# Patient Record
Sex: Male | Born: 1970 | Race: White | Hispanic: No | Marital: Married | Smoking: Former smoker
Health system: Southern US, Community
[De-identification: ages and names within clinical notes are randomized; demographics above are authoritative.]

## PROBLEM LIST (undated history)

## (undated) DIAGNOSIS — E785 Hyperlipidemia, unspecified: Secondary | ICD-10-CM

## (undated) DIAGNOSIS — E781 Pure hyperglyceridemia: Secondary | ICD-10-CM

## (undated) DIAGNOSIS — K439 Ventral hernia without obstruction or gangrene: Secondary | ICD-10-CM

## (undated) DIAGNOSIS — K429 Umbilical hernia without obstruction or gangrene: Secondary | ICD-10-CM

## (undated) DIAGNOSIS — I2510 Atherosclerotic heart disease of native coronary artery without angina pectoris: Secondary | ICD-10-CM

## (undated) DIAGNOSIS — I272 Pulmonary hypertension, unspecified: Secondary | ICD-10-CM

## (undated) DIAGNOSIS — Z955 Presence of coronary angioplasty implant and graft: Secondary | ICD-10-CM

## (undated) DIAGNOSIS — I255 Ischemic cardiomyopathy: Secondary | ICD-10-CM

## (undated) DIAGNOSIS — F1010 Alcohol abuse, uncomplicated: Secondary | ICD-10-CM

## (undated) DIAGNOSIS — Z720 Tobacco use: Secondary | ICD-10-CM

## (undated) DIAGNOSIS — I10 Essential (primary) hypertension: Secondary | ICD-10-CM

## (undated) DIAGNOSIS — I5189 Other ill-defined heart diseases: Secondary | ICD-10-CM

## (undated) HISTORY — DX: Pure hyperglyceridemia: E78.1

## (undated) HISTORY — DX: Hyperlipidemia, unspecified: E78.5

## (undated) HISTORY — DX: Essential (primary) hypertension: I10

## (undated) HISTORY — PX: NO PAST SURGERIES: SHX2092

## (undated) HISTORY — DX: Presence of coronary angioplasty implant and graft: Z95.5

## (undated) HISTORY — DX: Atherosclerotic heart disease of native coronary artery without angina pectoris: I25.10

## (undated) HISTORY — DX: Other ill-defined heart diseases: I51.89

---

## 2007-04-30 ENCOUNTER — Emergency Department: Payer: Self-pay | Admitting: Emergency Medicine

## 2007-05-01 IMAGING — CT CT HEAD WITHOUT CONTRAST
2 series · 16 of 30 positions shown, 20 images · non-contrast
Comparison: none

REASON FOR EXAM: headache
COMMENTS:

[Series 2: without · axial · non-contrast · 0.45mm/px · z∈[-612,-492]mm · 13 of 30 slices shown, 17 images]
[im 3/30  brain]
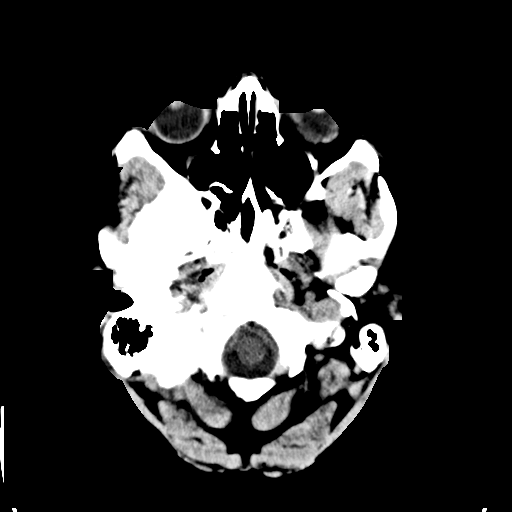
[im 3/30  bone]
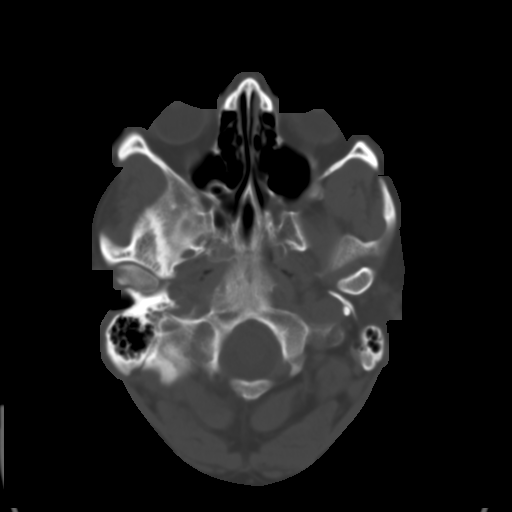
[im 5/30  brain]
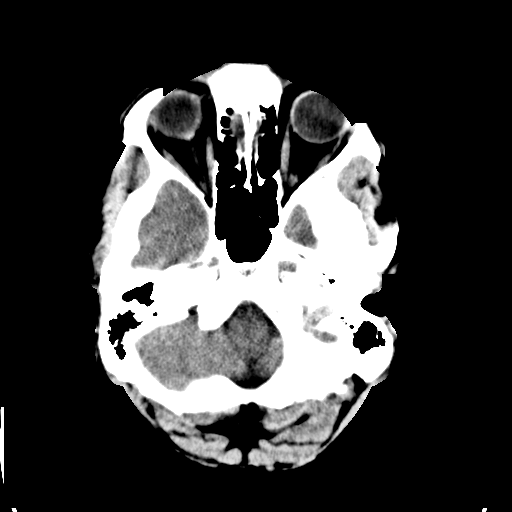
[im 7/30  brain]
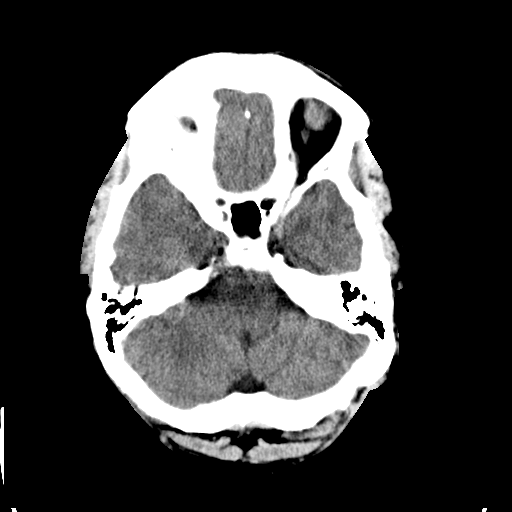
[im 9/30  brain]
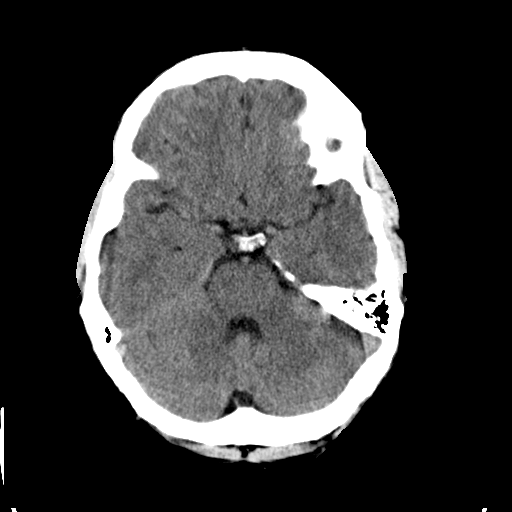
[im 11/30  brain]
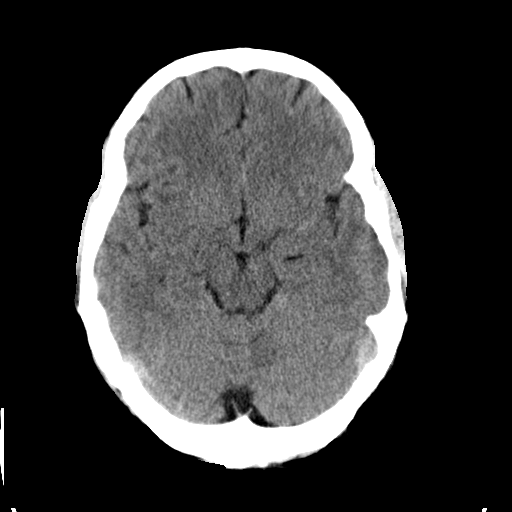
[im 11/30  bone]
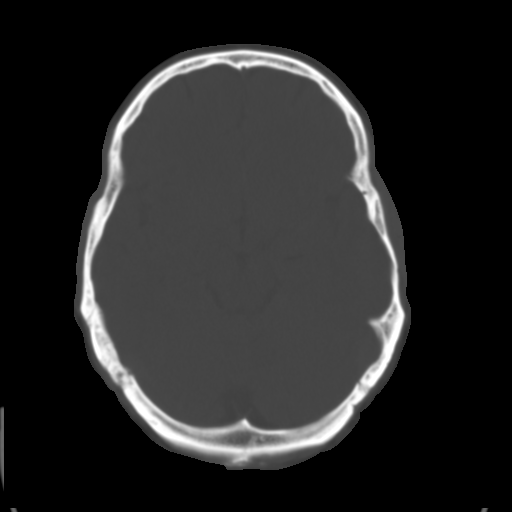
[im 13/30  brain]
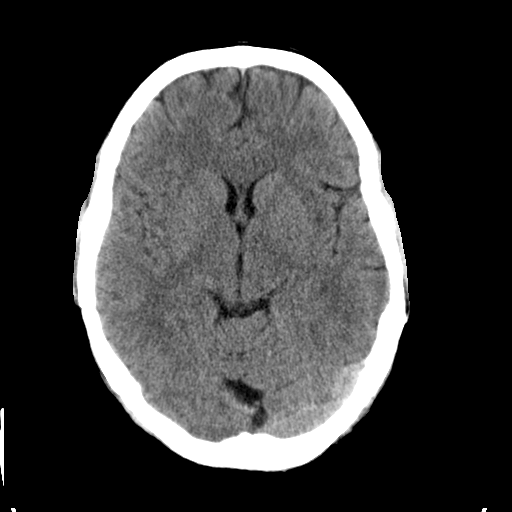
[im 15/30  brain]
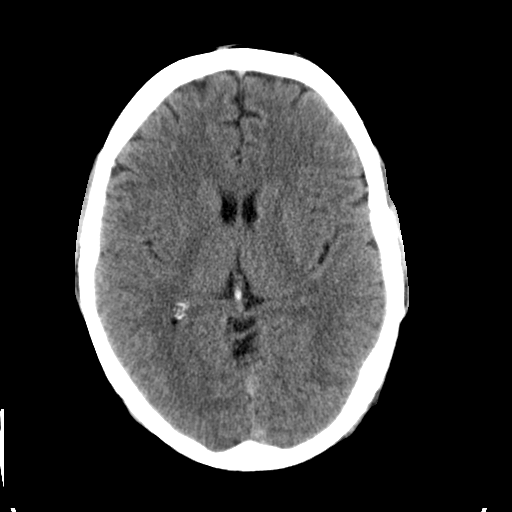
[im 17/30  brain]
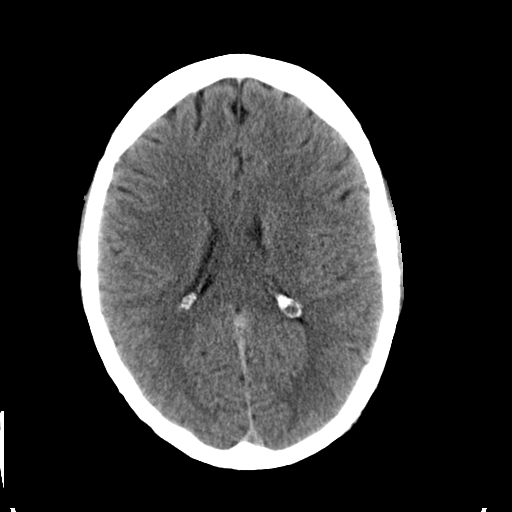
[im 19/30  brain]
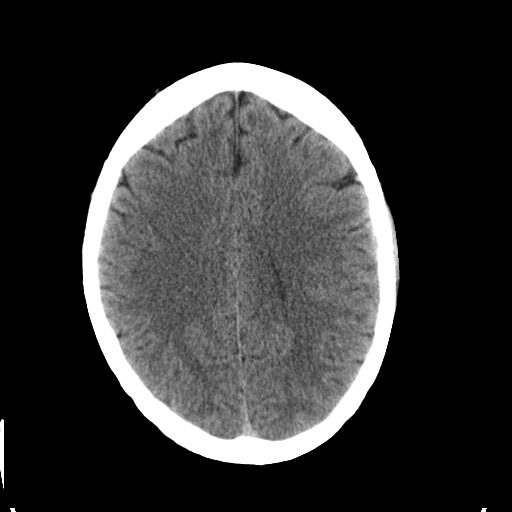
[im 19/30  bone]
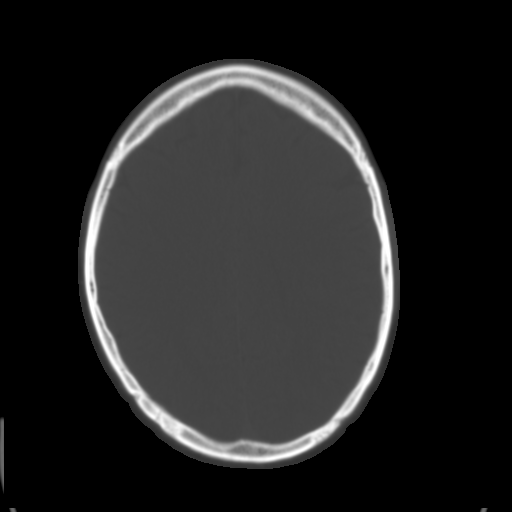
[im 21/30  brain]
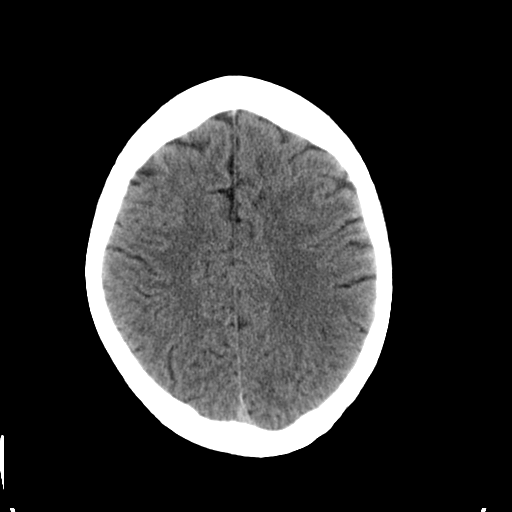
[im 23/30  brain]
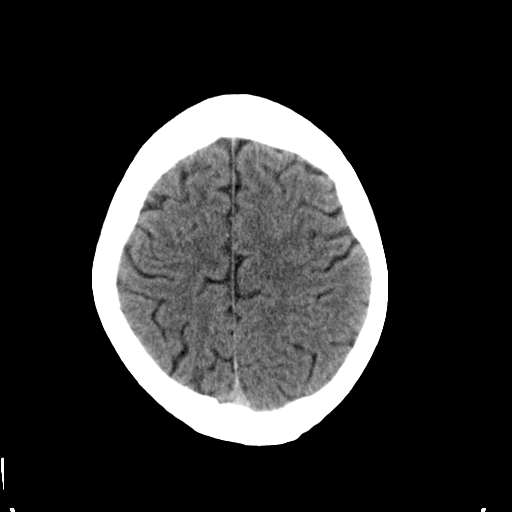
[im 25/30  brain]
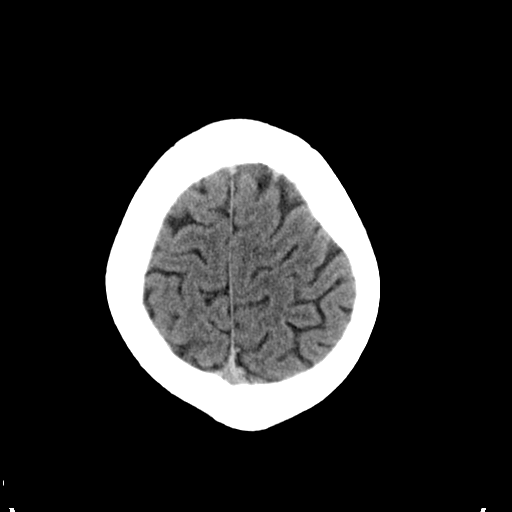
[im 27/30  brain]
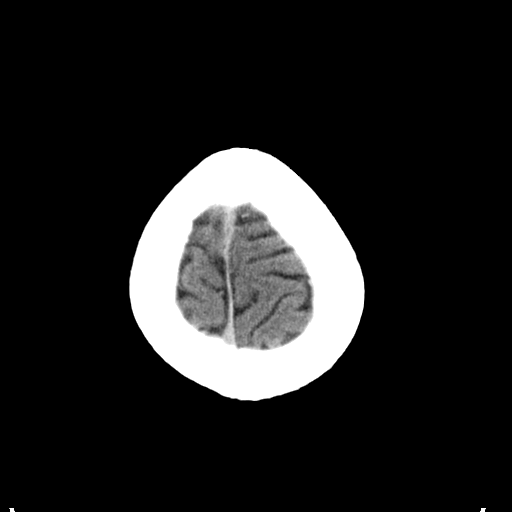
[im 27/30  bone]
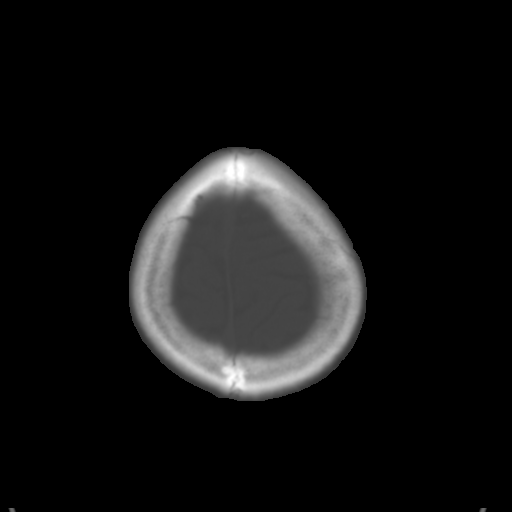

[Series 3: bone · axial · 0.45mm/px · z∈[-612,-572]mm · 3 of 30 slices shown]
[im 3/30  bone]
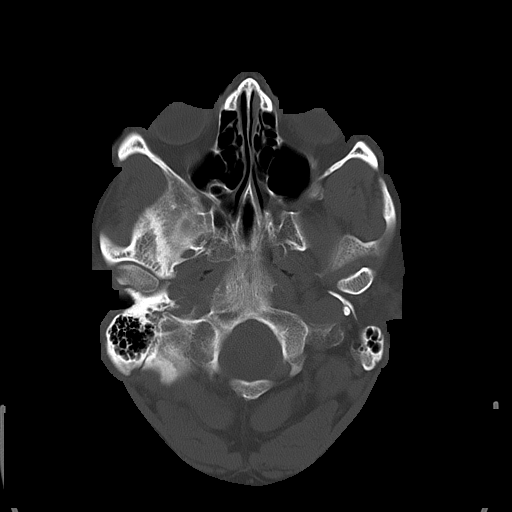
[im 7/30  bone]
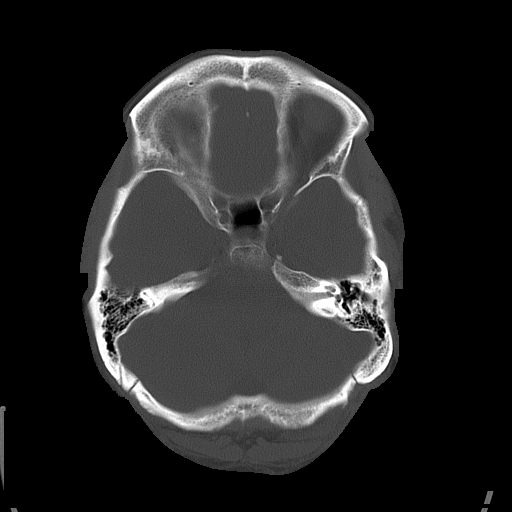
[im 11/30  bone]
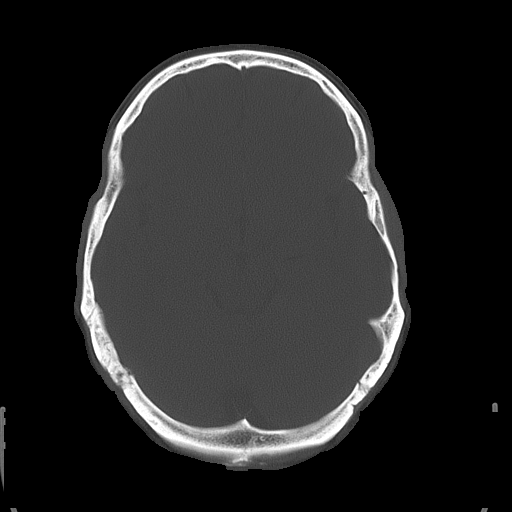

[16 of 30 positions shown; findings below may reference images not displayed]

PROCEDURE:     CT  - CT HEAD WITHOUT CONTRAST  - [DATE] [DATE]

RESULT:      There appears to be a moderate size area of fluid attenuation
in the region of the cisterna magna. This may represent an arachnoid cyst
versus a mega cisterna magna. Otherwise, no further intraaxial or extraaxial
fluid collections are identified. No evidence of acute hemorrhage. No
secondary signs are appreciated to suggest mass effect, subacute or chronic
infarction.  The visualized bony skeleton demonstrates no evidence of
fracture or dislocation.
IMPRESSION: 1.     Arachnoid cyst versus a mega cisterna magna. Otherwise, no evidence
of acute intracranial abnormalities.
2.     Dr. GENTIL of the Emergency Department was informed of these findings
via preliminary fax report on [DATE] at [DATE] a.m. CT.

## 2012-02-18 ENCOUNTER — Emergency Department: Payer: Self-pay | Admitting: Emergency Medicine

## 2012-02-18 LAB — DRUG SCREEN, URINE
Barbiturates, Ur Screen: NEGATIVE (ref ?–200)
Benzodiazepine, Ur Scrn: NEGATIVE (ref ?–200)
Cocaine Metabolite,Ur ~~LOC~~: NEGATIVE (ref ?–300)
Methadone, Ur Screen: NEGATIVE (ref ?–300)
Opiate, Ur Screen: NEGATIVE (ref ?–300)
Phencyclidine (PCP) Ur S: NEGATIVE (ref ?–25)
Tricyclic, Ur Screen: NEGATIVE (ref ?–1000)

## 2012-02-18 LAB — COMPREHENSIVE METABOLIC PANEL
Anion Gap: 11 (ref 7–16)
Calcium, Total: 8.8 mg/dL (ref 8.5–10.1)
Chloride: 110 mmol/L — ABNORMAL HIGH (ref 98–107)
Co2: 23 mmol/L (ref 21–32)
Creatinine: 0.97 mg/dL (ref 0.60–1.30)
EGFR (African American): >60
EGFR (Non-African Amer.): >60
Glucose: 108 mg/dL — ABNORMAL HIGH (ref 65–99)
Osmolality: 286 (ref 275–301)
Potassium: 3.8 mmol/L (ref 3.5–5.1)
SGOT(AST): 33 U/L (ref 15–37)
Sodium: 144 mmol/L (ref 136–145)

## 2012-02-18 LAB — CBC
HGB: 15.5 g/dL (ref 13.0–18.0)
MCH: 32.9 pg (ref 26.0–34.0)
MCHC: 35.8 g/dL (ref 32.0–36.0)
MCV: 92 fL (ref 80–100)
RDW: 12.9 % (ref 11.5–14.5)
WBC: 10.3 10*3/uL (ref 3.8–10.6)
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
## 2017-10-05 DIAGNOSIS — H524 Presbyopia: Secondary | ICD-10-CM | POA: Diagnosis not present

## 2018-01-08 ENCOUNTER — Inpatient Hospital Stay
Admission: EM | Admit: 2018-01-08 | Discharge: 2018-01-09 | DRG: 280 | Disposition: A | Discharge status: Other Acute Inpatient Hospital | Payer: BLUE CROSS/BLUE SHIELD | Attending: Internal Medicine | Admitting: Internal Medicine

## 2018-01-08 ENCOUNTER — Emergency Department: Payer: BLUE CROSS/BLUE SHIELD

## 2018-01-08 ENCOUNTER — Encounter: Payer: Self-pay | Admitting: Emergency Medicine

## 2018-01-08 ENCOUNTER — Other Ambulatory Visit: Payer: Self-pay

## 2018-01-08 DIAGNOSIS — I5031 Acute diastolic (congestive) heart failure: Secondary | ICD-10-CM | POA: Diagnosis present

## 2018-01-08 DIAGNOSIS — I214 Non-ST elevation (NSTEMI) myocardial infarction: Secondary | ICD-10-CM | POA: Diagnosis not present

## 2018-01-08 DIAGNOSIS — I160 Hypertensive urgency: Secondary | ICD-10-CM | POA: Diagnosis present

## 2018-01-08 DIAGNOSIS — H9319 Tinnitus, unspecified ear: Secondary | ICD-10-CM | POA: Diagnosis present

## 2018-01-08 DIAGNOSIS — I5030 Unspecified diastolic (congestive) heart failure: Secondary | ICD-10-CM | POA: Diagnosis not present

## 2018-01-08 DIAGNOSIS — Z8249 Family history of ischemic heart disease and other diseases of the circulatory system: Secondary | ICD-10-CM

## 2018-01-08 DIAGNOSIS — Z716 Tobacco abuse counseling: Secondary | ICD-10-CM | POA: Diagnosis not present

## 2018-01-08 DIAGNOSIS — R7301 Impaired fasting glucose: Secondary | ICD-10-CM | POA: Diagnosis not present

## 2018-01-08 DIAGNOSIS — J45909 Unspecified asthma, uncomplicated: Secondary | ICD-10-CM | POA: Diagnosis present

## 2018-01-08 DIAGNOSIS — R748 Abnormal levels of other serum enzymes: Secondary | ICD-10-CM | POA: Diagnosis not present

## 2018-01-08 DIAGNOSIS — T463X5A Adverse effect of coronary vasodilators, initial encounter: Secondary | ICD-10-CM | POA: Diagnosis not present

## 2018-01-08 DIAGNOSIS — F17210 Nicotine dependence, cigarettes, uncomplicated: Secondary | ICD-10-CM | POA: Diagnosis present

## 2018-01-08 DIAGNOSIS — I10 Essential (primary) hypertension: Secondary | ICD-10-CM | POA: Diagnosis not present

## 2018-01-08 DIAGNOSIS — F1010 Alcohol abuse, uncomplicated: Secondary | ICD-10-CM | POA: Diagnosis present

## 2018-01-08 DIAGNOSIS — G4440 Drug-induced headache, not elsewhere classified, not intractable: Secondary | ICD-10-CM | POA: Diagnosis not present

## 2018-01-08 DIAGNOSIS — I110 Hypertensive heart disease with heart failure: Secondary | ICD-10-CM | POA: Diagnosis present

## 2018-01-08 DIAGNOSIS — E785 Hyperlipidemia, unspecified: Secondary | ICD-10-CM | POA: Diagnosis not present

## 2018-01-08 DIAGNOSIS — I2510 Atherosclerotic heart disease of native coronary artery without angina pectoris: Secondary | ICD-10-CM | POA: Diagnosis present

## 2018-01-08 DIAGNOSIS — Z7141 Alcohol abuse counseling and surveillance of alcoholic: Secondary | ICD-10-CM | POA: Diagnosis not present

## 2018-01-08 DIAGNOSIS — Z801 Family history of malignant neoplasm of trachea, bronchus and lung: Secondary | ICD-10-CM | POA: Diagnosis not present

## 2018-01-08 DIAGNOSIS — R079 Chest pain, unspecified: Secondary | ICD-10-CM | POA: Diagnosis not present

## 2018-01-08 HISTORY — DX: Tobacco use: Z72.0

## 2018-01-08 HISTORY — DX: Alcohol abuse, uncomplicated: F10.10

## 2018-01-08 LAB — URINALYSIS, COMPLETE (UACMP) WITH MICROSCOPIC
BACTERIA UA: NONE SEEN
Bilirubin Urine: NEGATIVE
Glucose, UA: NEGATIVE mg/dL
Hgb urine dipstick: NEGATIVE
KETONES UR: 5 mg/dL — AB
LEUKOCYTES UA: NEGATIVE
Nitrite: NEGATIVE
PROTEIN: NEGATIVE mg/dL
SQUAMOUS EPITHELIAL / LPF: NONE SEEN (ref 0–5)
Specific Gravity, Urine: 1.021 (ref 1.005–1.030)
pH: 7 (ref 5.0–8.0)

## 2018-01-08 LAB — HEMOGLOBIN A1C
HEMOGLOBIN A1C: 5.7 % — AB (ref 4.8–5.6)
MEAN PLASMA GLUCOSE: 116.89 mg/dL

## 2018-01-08 LAB — BASIC METABOLIC PANEL
ANION GAP: 11 (ref 5–15)
BUN: 14 mg/dL (ref 6–20)
CO2: 23 mmol/L (ref 22–32)
Calcium: 8.5 mg/dL — ABNORMAL LOW (ref 8.9–10.3)
Chloride: 107 mmol/L (ref 98–111)
Creatinine, Ser: 0.93 mg/dL (ref 0.61–1.24)
Glucose, Bld: 155 mg/dL — ABNORMAL HIGH (ref 70–99)
Potassium: 3.5 mmol/L (ref 3.5–5.1)
SODIUM: 141 mmol/L (ref 135–145)

## 2018-01-08 LAB — URINE DRUG SCREEN, QUALITATIVE (ARMC ONLY)
Amphetamines, Ur Screen: NOT DETECTED
Benzodiazepine, Ur Scrn: NOT DETECTED
COCAINE METABOLITE, UR ~~LOC~~: NOT DETECTED
Cannabinoid 50 Ng, Ur ~~LOC~~: NOT DETECTED
MDMA (ECSTASY) UR SCREEN: NOT DETECTED
METHADONE SCREEN, URINE: NOT DETECTED
OPIATE, UR SCREEN: POSITIVE — AB
Phencyclidine (PCP) Ur S: NOT DETECTED
Tricyclic, Ur Screen: NOT DETECTED

## 2018-01-08 LAB — CBC
HCT: 44.9 % (ref 40.0–52.0)
HEMOGLOBIN: 16 g/dL (ref 13.0–18.0)
MCH: 33.1 pg (ref 26.0–34.0)
MCHC: 35.6 g/dL (ref 32.0–36.0)
MCV: 93.2 fL (ref 80.0–100.0)
PLATELETS: 239 10*3/uL (ref 150–440)
RBC: 4.82 MIL/uL (ref 4.40–5.90)
RDW: 13.4 % (ref 11.5–14.5)
WBC: 5.9 10*3/uL (ref 3.8–10.6)

## 2018-01-08 LAB — APTT: APTT: 27 s (ref 24–36)

## 2018-01-08 LAB — TROPONIN I
TROPONIN I: 0.81 ng/mL — AB (ref <0.03)
Troponin I: 0.75 ng/mL (ref <0.03)
Troponin I: 0.76 ng/mL (ref <0.03)

## 2018-01-08 LAB — MRSA PCR SCREENING: MRSA by PCR: NEGATIVE

## 2018-01-08 LAB — PROTIME-INR
INR: 0.94
PROTHROMBIN TIME: 12.5 s (ref 11.4–15.2)

## 2018-01-08 LAB — GLUCOSE, CAPILLARY: Glucose-Capillary: 119 mg/dL — ABNORMAL HIGH (ref 70–99)

## 2018-01-08 LAB — HEPARIN LEVEL (UNFRACTIONATED): HEPARIN UNFRACTIONATED: 0.19 [IU]/mL — AB (ref 0.30–0.70)

## 2018-01-08 IMAGING — CR DG CHEST 2V
2 series · 2 of 2 positions shown · non-contrast
Comparison: Chest CT [DATE]

CLINICAL DATA: Mid chest pain.

EXAM:
CHEST - 2 VIEW

[chest pa]
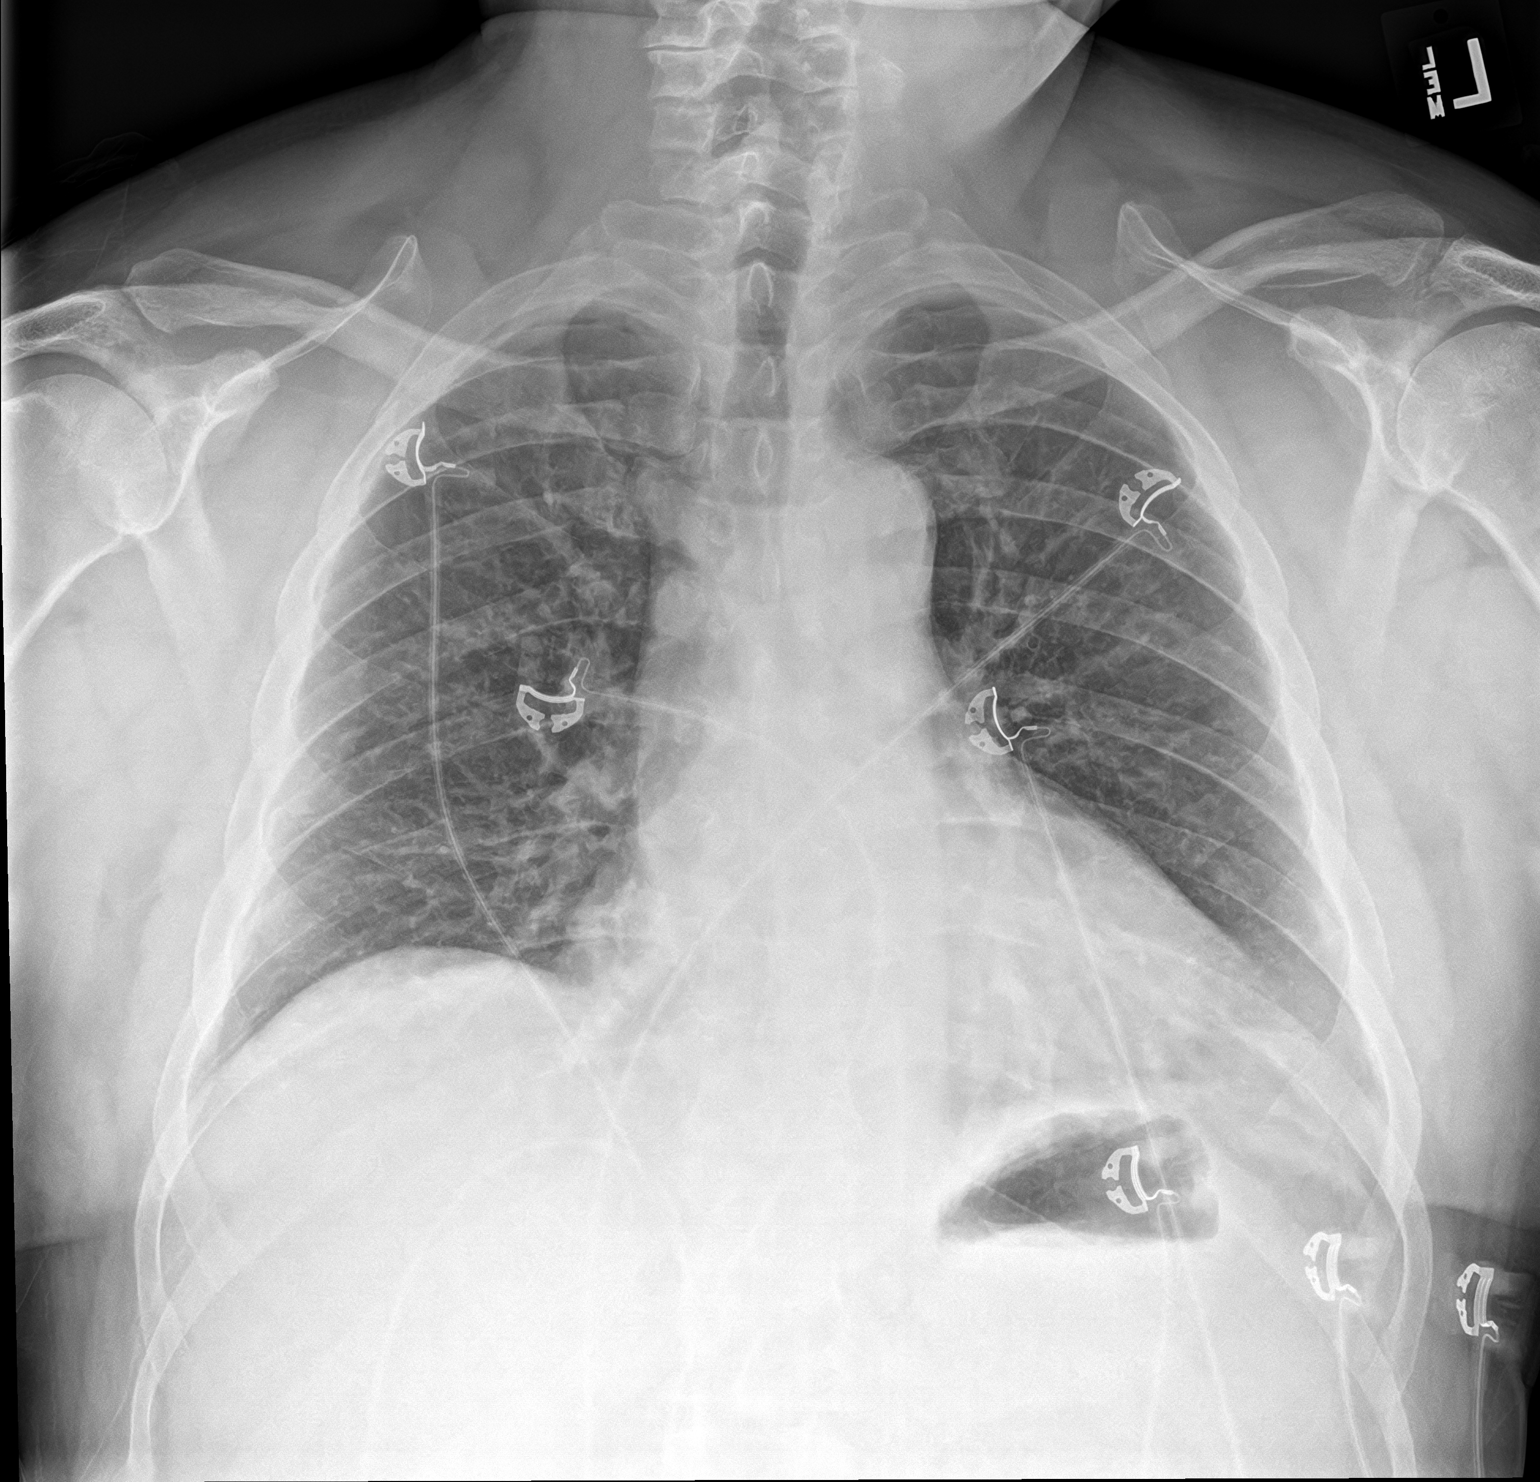

[chest lat]
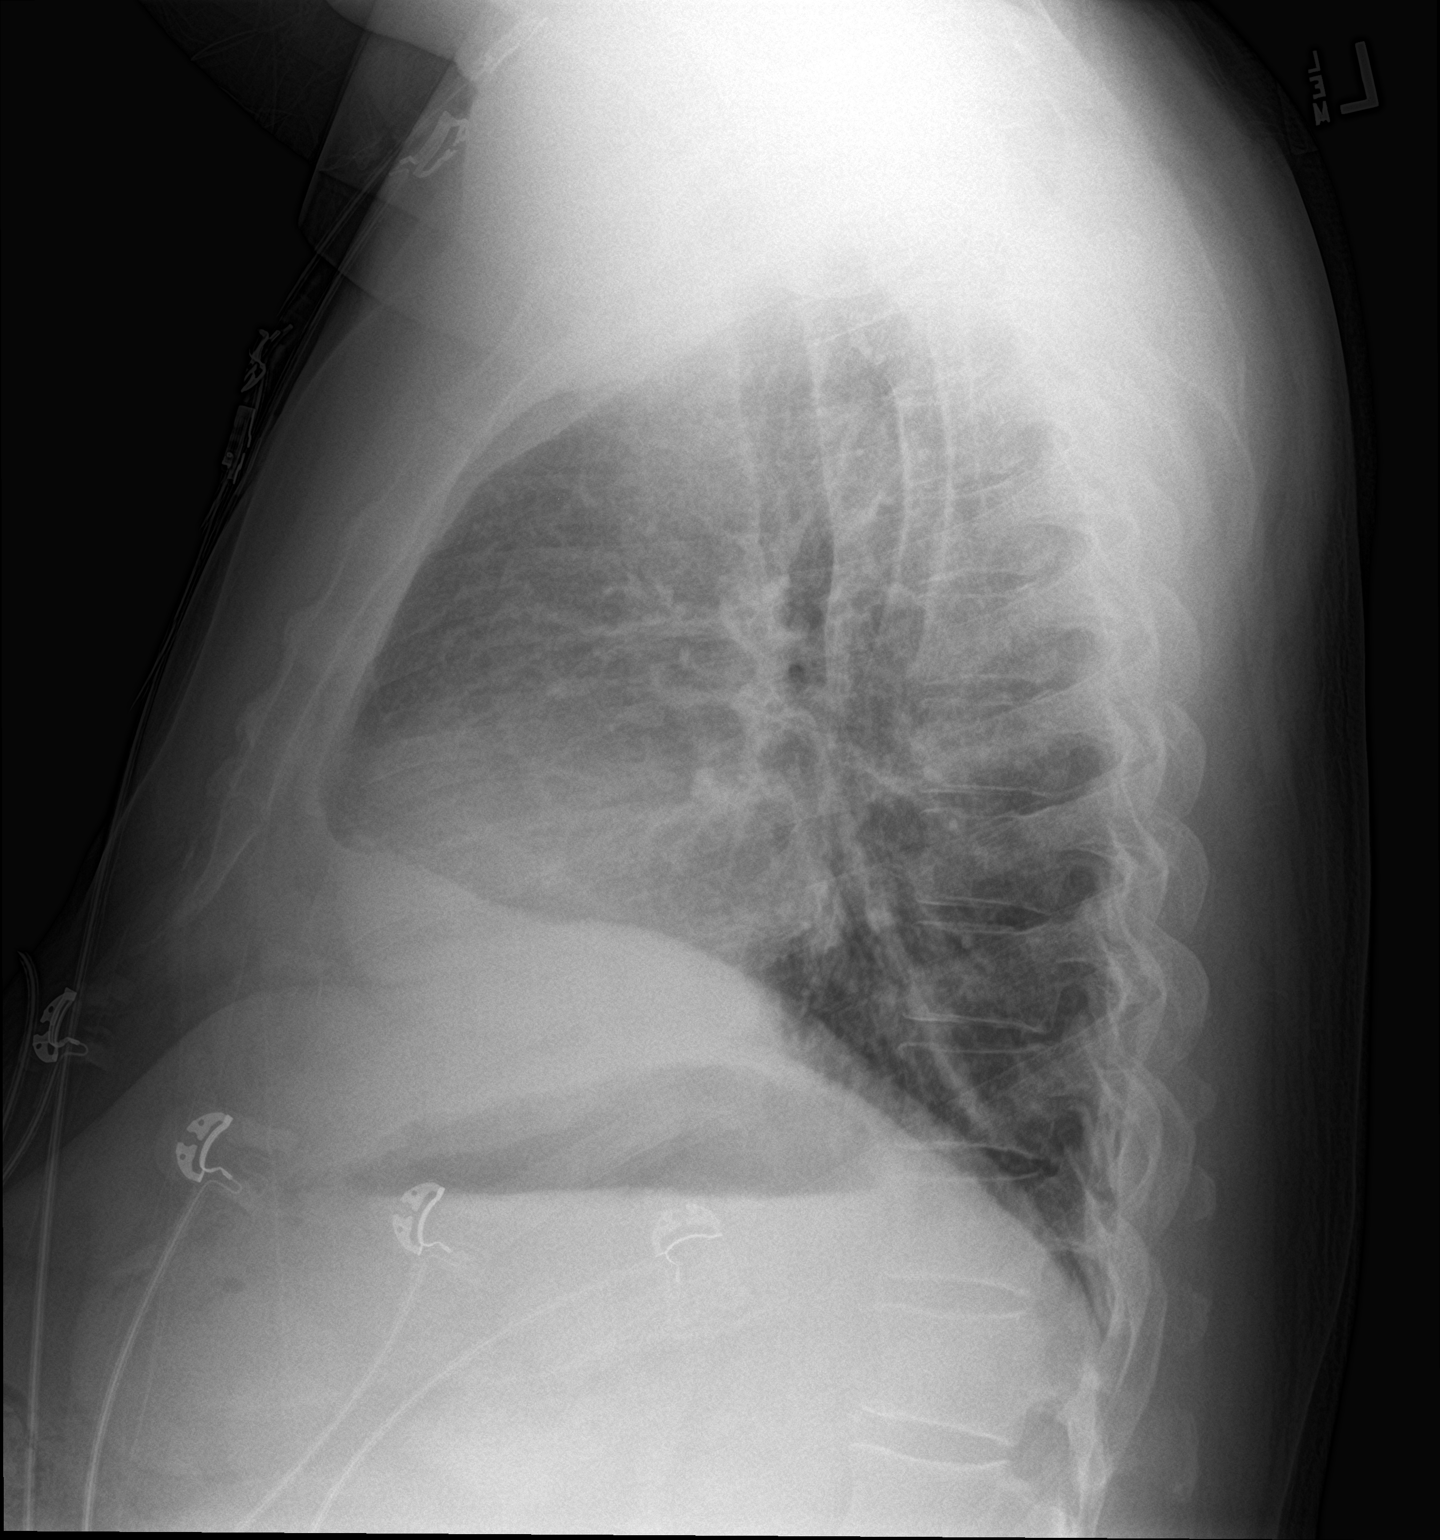

[2 of 2 positions shown; findings below may reference images not displayed]

FINDINGS: Monitoring leads overlie the patient. Mild cardiomegaly. No
consolidative pulmonary opacities. Elevation right hemidiaphragm. No
pleural effusion or pneumothorax. Regional skeleton unremarkable.
IMPRESSION: No acute cardiopulmonary process.

## 2018-01-08 MED ORDER — ATORVASTATIN CALCIUM 20 MG PO TABS
80.0000 mg | ORAL_TABLET | Freq: Every day | ORAL | Status: DC
  Administered 2018-01-08: 80 mg via ORAL
  Filled 2018-01-08: qty 4

## 2018-01-08 MED ORDER — ACETAMINOPHEN 650 MG RE SUPP: 650.0000 mg | Freq: Four times a day (QID) | RECTAL | Status: DC | PRN

## 2018-01-08 MED ORDER — ACETAMINOPHEN 325 MG PO TABS: 650.0000 mg | ORAL_TABLET | Freq: Four times a day (QID) | ORAL | Status: DC | PRN

## 2018-01-08 MED ORDER — ASPIRIN 81 MG PO CHEW
81.0000 mg | CHEWABLE_TABLET | ORAL | Status: AC
  Administered 2018-01-09: 81 mg via ORAL
  Filled 2018-01-08: qty 1

## 2018-01-08 MED ORDER — SODIUM CHLORIDE 0.9 % IV SOLN
INTRAVENOUS | Status: DC
  Administered 2018-01-09: 06:00:00 via INTRAVENOUS

## 2018-01-08 MED ORDER — MORPHINE SULFATE (PF) 2 MG/ML IV SOLN
2.0000 mg | INTRAVENOUS | Status: DC | PRN
  Administered 2018-01-08 (×2): 2 mg via INTRAVENOUS
  Filled 2018-01-08 (×2): qty 1

## 2018-01-08 MED ORDER — ASPIRIN 81 MG PO CHEW
CHEWABLE_TABLET | ORAL | Status: AC
  Administered 2018-01-08: 324 mg via ORAL
  Filled 2018-01-08: qty 4

## 2018-01-08 MED ORDER — ASPIRIN 81 MG PO CHEW
324.0000 mg | CHEWABLE_TABLET | Freq: Once | ORAL | Status: AC
  Administered 2018-01-08: 324 mg via ORAL

## 2018-01-08 MED ORDER — SODIUM CHLORIDE 0.9 % IV SOLN: 250.0000 mL | INTRAVENOUS | Status: DC | PRN

## 2018-01-08 MED ORDER — SODIUM CHLORIDE 0.9% FLUSH: 3.0000 mL | INTRAVENOUS | Status: DC | PRN

## 2018-01-08 MED ORDER — HEPARIN BOLUS VIA INFUSION
2600.0000 [IU] | Freq: Once | INTRAVENOUS | Status: AC
  Administered 2018-01-08: 2600 [IU] via INTRAVENOUS
  Filled 2018-01-08: qty 2600

## 2018-01-08 MED ORDER — OXYCODONE HCL 5 MG PO TABS
5.0000 mg | ORAL_TABLET | ORAL | Status: DC | PRN
  Administered 2018-01-08 (×2): 5 mg via ORAL
  Filled 2018-01-08 (×2): qty 1

## 2018-01-08 MED ORDER — AZITHROMYCIN 250 MG PO TABS
250.0000 mg | ORAL_TABLET | Freq: Every day | ORAL | Status: DC
  Filled 2018-01-08: qty 1

## 2018-01-08 MED ORDER — METHYLPREDNISOLONE SODIUM SUCC 40 MG IJ SOLR
40.0000 mg | Freq: Two times a day (BID) | INTRAMUSCULAR | Status: DC
  Administered 2018-01-08 (×2): 40 mg via INTRAVENOUS
  Filled 2018-01-08 (×2): qty 1

## 2018-01-08 MED ORDER — HEPARIN BOLUS VIA INFUSION
4000.0000 [IU] | Freq: Once | INTRAVENOUS | Status: AC
  Administered 2018-01-08: 4000 [IU] via INTRAVENOUS
  Filled 2018-01-08: qty 4000

## 2018-01-08 MED ORDER — ONDANSETRON HCL 4 MG PO TABS: 4.0000 mg | ORAL_TABLET | Freq: Four times a day (QID) | ORAL | Status: DC | PRN

## 2018-01-08 MED ORDER — ASPIRIN EC 81 MG PO TBEC: 81.0000 mg | DELAYED_RELEASE_TABLET | Freq: Every day | ORAL | Status: DC

## 2018-01-08 MED ORDER — METOPROLOL TARTRATE 25 MG PO TABS
12.5000 mg | ORAL_TABLET | Freq: Two times a day (BID) | ORAL | Status: DC
  Administered 2018-01-08: 12.5 mg via ORAL
  Filled 2018-01-08: qty 1

## 2018-01-08 MED ORDER — AMLODIPINE BESYLATE 5 MG PO TABS
5.0000 mg | ORAL_TABLET | Freq: Every day | ORAL | Status: DC
  Administered 2018-01-08: 5 mg via ORAL
  Filled 2018-01-08: qty 1

## 2018-01-08 MED ORDER — IPRATROPIUM-ALBUTEROL 0.5-2.5 (3) MG/3ML IN SOLN
3.0000 mL | Freq: Four times a day (QID) | RESPIRATORY_TRACT | Status: DC
  Administered 2018-01-08 – 2018-01-09 (×4): 3 mL via RESPIRATORY_TRACT
  Filled 2018-01-08 (×4): qty 3

## 2018-01-08 MED ORDER — CARVEDILOL 6.25 MG PO TABS
6.2500 mg | ORAL_TABLET | Freq: Once | ORAL | Status: AC
  Administered 2018-01-08: 6.25 mg via ORAL
  Filled 2018-01-08: qty 1

## 2018-01-08 MED ORDER — LOSARTAN POTASSIUM 50 MG PO TABS
25.0000 mg | ORAL_TABLET | Freq: Every day | ORAL | Status: DC
  Administered 2018-01-08: 25 mg via ORAL
  Filled 2018-01-08: qty 1

## 2018-01-08 MED ORDER — CARVEDILOL 12.5 MG PO TABS
12.5000 mg | ORAL_TABLET | Freq: Two times a day (BID) | ORAL | Status: DC
  Administered 2018-01-09: 12.5 mg via ORAL
  Filled 2018-01-08: qty 1

## 2018-01-08 MED ORDER — SODIUM CHLORIDE 0.9% FLUSH
3.0000 mL | Freq: Two times a day (BID) | INTRAVENOUS | Status: DC
  Administered 2018-01-08: 3 mL via INTRAVENOUS

## 2018-01-08 MED ORDER — METOPROLOL TARTRATE 5 MG/5ML IV SOLN
5.0000 mg | Freq: Once | INTRAVENOUS | Status: AC
  Administered 2018-01-08: 5 mg via INTRAVENOUS
  Filled 2018-01-08: qty 5

## 2018-01-08 MED ORDER — LABETALOL HCL 5 MG/ML IV SOLN
5.0000 mg | INTRAVENOUS | Status: DC | PRN
  Administered 2018-01-08 (×3): 5 mg via INTRAVENOUS
  Filled 2018-01-08 (×2): qty 4

## 2018-01-08 MED ORDER — BUDESONIDE 0.5 MG/2ML IN SUSP
0.5000 mg | Freq: Two times a day (BID) | RESPIRATORY_TRACT | Status: DC
  Administered 2018-01-08 – 2018-01-09 (×3): 0.5 mg via RESPIRATORY_TRACT
  Filled 2018-01-08 (×5): qty 2

## 2018-01-08 MED ORDER — ONDANSETRON HCL 4 MG/2ML IJ SOLN: 4.0000 mg | Freq: Four times a day (QID) | INTRAMUSCULAR | Status: DC | PRN

## 2018-01-08 MED ORDER — CARVEDILOL 6.25 MG PO TABS
6.2500 mg | ORAL_TABLET | Freq: Two times a day (BID) | ORAL | Status: DC
  Administered 2018-01-08: 6.25 mg via ORAL
  Filled 2018-01-08: qty 1

## 2018-01-08 MED ORDER — NITROGLYCERIN IN D5W 200-5 MCG/ML-% IV SOLN
0.0000 ug/min | INTRAVENOUS | Status: DC
  Administered 2018-01-08: 5 ug/min via INTRAVENOUS
  Filled 2018-01-08: qty 250

## 2018-01-08 MED ORDER — NITROGLYCERIN 0.4 MG SL SUBL
SUBLINGUAL_TABLET | SUBLINGUAL | Status: AC
  Administered 2018-01-08: 0.4 mg via SUBLINGUAL
  Filled 2018-01-08: qty 1

## 2018-01-08 MED ORDER — NITROGLYCERIN 0.4 MG SL SUBL
0.4000 mg | SUBLINGUAL_TABLET | SUBLINGUAL | Status: DC | PRN
  Administered 2018-01-08 (×3): 0.4 mg via SUBLINGUAL
  Filled 2018-01-08: qty 1

## 2018-01-08 MED ORDER — AZITHROMYCIN 500 MG PO TABS
500.0000 mg | ORAL_TABLET | Freq: Every day | ORAL | Status: AC
  Administered 2018-01-08: 500 mg via ORAL
  Filled 2018-01-08: qty 1

## 2018-01-08 MED ORDER — HEPARIN (PORCINE) IN NACL 100-0.45 UNIT/ML-% IJ SOLN
1400.0000 [IU]/h | INTRAMUSCULAR | Status: DC
  Administered 2018-01-08: 1400 [IU]/h via INTRAVENOUS
  Administered 2018-01-08: 1100 [IU]/h via INTRAVENOUS
  Filled 2018-01-08 (×2): qty 250

## 2018-01-08 NOTE — H&P (View-Only) (Signed)
Cardiology Consult    Patient ID: Edward Powell MRN: [id number], DOB/AGE: 02/13/1971   Admit date: 01/08/2018 Date of Consult: 01/08/2018  Primary Physician: Patient, No Pcp Per Primary Cardiologist: Lorine Bears, MD Requesting Provider: R. Wieting, MD  Patient Profile    Edward Powell is a 47 y.o. male with a history of tob abuse, etoh abuse, and untreated HTN, who is being seen today for the evaluation of NSTEMI and HTN urgency at the request of Dr. Renae Gloss.  Past Medical History   Past Medical History:  Diagnosis Date  . Alcohol abuse   . Hypertension    a. Untreated - had lost wt @ one point and BP improved.  . Tobacco abuse     Past Surgical History:  Procedure Laterality Date  . NO PAST SURGERIES       Allergies  No Known Allergies  History of Present Illness   47 year old-year-old male without prior cardiac history.  He does have a history of hypertension, stating that he was diagnosed several years ago but then lost weight but has slowly but surely regained it.  He has not been to a doctor in many years.  He has smoked a pack a day for most of his adult life but over the past 2 to 3 months has been smoking a pack a week.  He drinks heavily about twice a week, doing multiple vodka shots per setting.  He was in his usual state of health until about 2 to 3 months ago, when he began to experience a productive cough associated with chest congestion and occasional dyspnea.  It was at that point that he reduced his smoking.  This has persisted without fevers or chills.  He had not previously sought medical attention for this.  On the evening of July 19, he went to [employer] with his wife and while in the store, he became very short of breath.  He walked out to the car and started having chest tightness and also became diaphoretic.  He sat in the car with air conditioning on and symptoms resolved within about 15 minutes.  He had no recurrent symptoms the remainder  of Friday were on Saturday/Sunday.  This morning, he was sitting at his work desk and began coughing violently.  He was also sneezing some.  In that setting, he had sudden onset of severe 10/10 substernal chest burning associated with dyspnea, nausea, and diaphoresis.  He says he was having significant trouble getting his breath and he presented to the emergency department for further evaluation.  Here, he was markedly hypertensive with a blood pressure of 204/133.  ECG notable for subtle inferolateral ST depression.  Initial troponin was 0.81.  Chest x-ray without acute cardiopulmonary process.  Patient was placed on heparin and also seen by internal medicine and placed on antibiotics and steroids.  Patient is not actively wheezing.  Blood pressure remains elevated and continues to have 5/10 chest pain at the time that I saw him.  IV nitroglycerin added.  Inpatient Medications    . amLODipine  5 mg Oral Daily  . [START ON 01/09/2018] aspirin EC  81 mg Oral Daily  . atorvastatin  80 mg Oral q1800  . [START ON 01/09/2018] azithromycin  250 mg Oral Daily  . budesonide (PULMICORT) nebulizer solution  0.5 mg Nebulization BID  . ipratropium-albuterol  3 mL Nebulization Q6H  . methylPREDNISolone (SOLU-MEDROL) injection  40 mg Intravenous Q12H  . metoprolol tartrate  5 mg  Intravenous Once  . metoprolol tartrate  12.5 mg Oral BID    Family History    Family History  Problem Relation Age of Onset  . Hypertension Mother   . Breast cancer Mother   . Liver cancer Father   . Lung cancer Father   . Hypertension Father   . Atrial fibrillation Father    indicated that his mother is alive. He indicated that his father is alive.   Social History    Social History   Socioeconomic History  . Marital status: Married    Spouse name: Not on file  . Number of children: Not on file  . Years of education: Not on file  . Highest education level: Not on file  Occupational History  . Not on file  Social  Needs  . Financial resource strain: Not on file  . Food insecurity:    Worry: Not on file    Inability: Not on file  . Transportation needs:    Medical: Not on file    Non-medical: Not on file  Tobacco Use  . Smoking status: Current Every Day Smoker  . Smokeless tobacco: Never Used  . Tobacco comment: Was smoking a ppd until about 3 mos ago. Now smoking a pack/wk.  Substance and Sexual Activity  . Alcohol use: Yes    Alcohol/week: 9.0 - 12.0 oz    Types: 15 - 20 Shots of liquor per week    Comment: up to 7-9 shots twice weekly - vodka  . Drug use: Not Currently  . Sexual activity: Yes  Lifestyle  . Physical activity:    Days per week: Not on file    Minutes per session: Not on file  . Stress: Not on file  Relationships  . Social connections:    Talks on phone: Not on file    Gets together: Not on file    Attends religious service: Not on file    Active member of club or organization: Not on file    Attends meetings of clubs or organizations: Not on file    Relationship status: Not on file  . Intimate partner violence:    Fear of current or ex partner: Not on file    Emotionally abused: Not on file    Physically abused: Not on file    Forced sexual activity: Not on file  Other Topics Concern  . Not on file  Social History Narrative   [place].  No children.  Does not routinely exercise.     Review of Systems    General:  No chills, fever, night sweats or weight changes.  Cardiovascular:  +++ chest pain, +++ dyspnea on exertion, no edema, orthopnea, palpitations, paroxysmal nocturnal dyspnea. Dermatological: No rash, lesions/masses Respiratory: +++ Productive cough, +++ dyspnea Urologic: No hematuria, dysuria Abdominal:   +++ nausea, no vomiting, diarrhea, bright red blood per rectum, melena, or hematemesis Neurologic:  No visual changes, wkns, changes in mental status. All other systems reviewed and are otherwise negative except as  noted above.  Physical Exam    Blood pressure (!) 198/120, pulse 90, temperature 97.9 F (36.6 C), resp. rate 16, height 5\' 8"  (1.727 m), weight 200 lb (90.7 kg), SpO2 96 %.  General: Pleasant, NAD Psych: Normal affect. Neuro: Alert and oriented X 3. Moves all extremities spontaneously. HEENT: Normal  Neck: Supple without bruits or JVD. Lungs:  Resp regular and unlabored, CTA. Heart: RRR no s3, s4, or murmurs. Abdomen: Soft, non-tender,  non-distended, BS + x 4.  Extremities: No clubbing, cyanosis or edema. DP/PT/Radials 2+ and equal bilaterally.  Labs     Recent Labs    01/08/18 0842  TROPONINI 0.81*   Lab Results  Component Value Date   WBC 5.9 01/08/2018   HGB 16.0 01/08/2018   HCT 44.9 01/08/2018   MCV 93.2 01/08/2018   PLT 239 01/08/2018    Recent Labs  Lab 01/08/18 0842  NA 141  K 3.5  CL 107  CO2 23  BUN 14  CREATININE 0.93  CALCIUM 8.5*  GLUCOSE 155*    Radiology Studies    Dg Chest 2 View  Result Date: 01/08/2018 CLINICAL DATA:  Mid chest pain. EXAM: CHEST - 2 VIEW COMPARISON:  Chest CT 02/18/2012 FINDINGS: Monitoring leads overlie the patient. Mild cardiomegaly. No consolidative pulmonary opacities. Elevation right hemidiaphragm. No pleural effusion or pneumothorax. Regional skeleton unremarkable. IMPRESSION: No acute cardiopulmonary process. Electronically Signed   By: Annia Belt M.D.   On: 01/08/2018 09:12    ECG & Cardiac Imaging    RSR, 93, inflat ST dep - no old for comparison  Assessment & Plan    1.  Non-STEMI: Patient without prior cardiac history presented this morning after developing acute onset of substernal chest burning associate with nausea, diaphoresis, and dyspnea.  He had a similar episode that occurred with exertion on July 19, lasting about 15 minutes, and resolving spontaneously.  In the emergency department, he was found to be markedly hypertensive.  He continued to have 5/10 chest discomfort.  He was placed on IV nitroglycerin  and heparin and IV beta-blocker was added.  Add aspirin, high potency statin, and oral beta-blocker.  Once blood pressure improved, we will plan on diagnostic catheterization-potentially tomorrow if stable. The patient understands that risks include but are not limited to stroke (1 in 1000), death (1 in 1000), kidney failure [usually temporary] (1 in 500), bleeding (1 in 200), allergic reaction [possibly serious] (1 in 200), and agrees to proceed.   2.  Hypertensive urgency: As above, patient developed chest pain was found to be markedly hypertensive.  He was diagnosed with hypertension several years ago but has never been on medication.  IV nitroglycerin and beta-blocker added.  Titrate medications as tolerated.  Switch metoprolol to carvedilol.  Amlodipine added by medicine.  3.  Tobacco abuse: Cessation advised.  Next  4.  Alcohol abuse: Binge drinks about twice a week.  Usually shots of vodka.  Cessation advised.  Signed, Nicolasa Ducking, NP 01/08/2018, 12:17 PM  For questions or updates, please contact   Please consult [email or web address] for contact info under Cardiology/STEMI.

## 2018-01-08 NOTE — ED Triage Notes (Signed)
Patient reports chest pain starting approx 7 am. States pain is intermittent. Reports nausea and SOB as well as diaphoresis. Denies history of similar episodes. Patient with persistent cough x1 month. States pain worse with cough. Taken 2 rounds of abx with minimal relief.

## 2018-01-08 NOTE — Progress Notes (Signed)
ANTICOAGULATION CONSULT NOTE - Initial Consult  Pharmacy Consult for Heparin Indication: chest pain/ACS  No Known Allergies  Patient Measurements: Height: 5\' 8"  (172.7 cm) Weight: 200 lb (90.7 kg) IBW/kg (Calculated) : 68.4 Heparin Dosing Weight: 87.1 kg  Vital Signs: Temp: 97.9 F (36.6 C) (07/22 0834) BP: 172/109 (07/22 0930) Pulse Rate: 84 (07/22 0930)  Labs: Recent Labs    01/08/18 0842  HGB 16.0  HCT 44.9  PLT 239  CREATININE 0.93  TROPONINI 0.81*    Estimated Creatinine Clearance: 106.2 mL/min (by C-G formula based on SCr of 0.93 mg/dL).   Medical History: Past Medical History:  Diagnosis Date  . Hypertension     Medications:  . heparin      Assessment: 47 yo male admitted with chest pain  Goal of Therapy:  Heparin level 0.3-0.7 units/ml Monitor platelets by anticoagulation protocol: Yes   Plan:  Give 4000 units bolus x 1 Start heparin infusion at 1100 units/hr Check anti-Xa level in 6 hours and daily while on heparin Continue to monitor H&H and platelets  [place] LabsKaren K Bobie Caris, RPh 01/08/2018,9:57 AM

## 2018-01-08 NOTE — ED Provider Notes (Signed)
[facility] Emergency Department Provider Note    ____________________________________________   I have reviewed the triage vital signs and the nursing notes.   HISTORY  Chief Complaint Chest Pain   History limited by: Not Limited   HPI Edward Powell is a 47 y.o. male who presents to the emergency department today because of concern for chest pressure. The symptoms started at 8 am. Patient was at work. Was not exerting himself. The patient states the pressure was located in the middle of his chest. He had an impending sense of doom. The patient states that he had a similar episode three days ago when he was walking and noticed that he could not exert himself as well since then. By the time of my examination the pressure had eased off. The patient states that he has also had a cough for the past month and has been put on antibiotics without any significant relief.    No past medical history on file.  There are no active problems to display for this patient.   Prior to Admission medications   Not on File    Allergies Patient has no known allergies.  No family history on file.  Social History Social History   Tobacco Use  . Smoking status: Current Every Day Smoker  . Smokeless tobacco: Never Used  Substance Use Topics  . Alcohol use: Yes  . Drug use: Not Currently   Review of Systems Constitutional: No fever/chills Eyes: No visual changes. ENT: No sore throat. Cardiovascular: Positive for chest pressure. Respiratory: Positive for shortness of breath. Positive for cough. Gastrointestinal: No abdominal pain.  Positive for nausea.  Genitourinary: Negative for dysuria. Musculoskeletal: Negative for back pain. Skin: Negative for rash. Neurological: Negative for headaches, focal weakness or numbness.  ____________________________________________   PHYSICAL EXAM:  VITAL SIGNS: ED Triage Vitals [01/08/18 0834]  Enc Vitals Group     BP (!)  204/133     Pulse Rate 95     Resp 20     Temp 97.9 F (36.6 C)     Temp src      SpO2 96 %     Weight 200 lb (90.7 kg)     Height 5\' 8"  (1.727 m)     Head Circumference      Peak Flow      Pain Score 8   Constitutional: Alert and oriented.  Eyes: Conjunctivae are normal.  ENT      Head: Normocephalic and atraumatic.      Nose: No congestion/rhinnorhea.      Mouth/Throat: Mucous membranes are moist.      Neck: No stridor. Hematological/Lymphatic/Immunilogical: No cervical lymphadenopathy. Cardiovascular: Normal rate, regular rhythm.  No murmurs, rubs, or gallops.  Respiratory: Normal respiratory effort without tachypnea nor retractions. Breath sounds are clear and equal bilaterally. No wheezes/rales/rhonchi. Gastrointestinal: Soft and non tender. No rebound. No guarding.  Genitourinary: Deferred Musculoskeletal: Normal range of motion in all extremities. No lower extremity edema. Neurologic:  Normal speech and language. No gross focal neurologic deficits are appreciated.  Skin:  Skin is warm, dry and intact. No rash noted. Psychiatric: Mood and affect are normal. Speech and behavior are normal. Patient exhibits appropriate insight and judgment.  ____________________________________________    LABS (pertinent positives/negatives)  Trop 0.81 CBC wnl BMP wnl except glu 155, ca 8.5  ____________________________________________   EKG  I, Phineas SemenGraydon Camrin Gearheart, attending physician, personally viewed and interpreted this EKG  EKG Time: 0830 Rate: 93 Rhythm: sinus rhythm Axis:  normal Intervals: qtc 423 QRS: narrow, q waves III ST changes: no st elevation Impression: abnormal ekg   ____________________________________________    RADIOLOGY  CXR No acute disease  ____________________________________________   PROCEDURES  Procedures  CRITICAL CARE Performed by: Phineas Semen   Total critical care time: 30 minutes  Critical care time was exclusive of  separately billable procedures and treating other patients.  Critical care was necessary to treat or prevent imminent or life-threatening deterioration.  Critical care was time spent personally by me on the following activities: development of treatment plan with patient and/or surrogate as well as nursing, discussions with consultants, evaluation of patient's response to treatment, examination of patient, obtaining history from patient or surrogate, ordering and performing treatments and interventions, ordering and review of laboratory studies, ordering and review of radiographic studies, pulse oximetry and re-evaluation of patient's condition.  ____________________________________________   INITIAL IMPRESSION / ASSESSMENT AND PLAN / ED COURSE  Pertinent labs & imaging results that were available during my care of the patient were reviewed by me and considered in my medical decision making (see chart for details).   Patient presented to the emergency department today because of concerns for episode of chest pressure.  He has had some shortness of breath.  Patient does have a history of smoking.  The patient's troponin was elevated.  At this point I think ACS likely.  No STEMI on EKG.  Patient still complaining of some discomfort especially with cough.  Had gotten better after nitroglycerin.  Will write for heparin drip.  Discussed findings and concern for heart attack with patient.  At this point I doubt PE doubt dissection.  Will plan on admission.  ____________________________________________   FINAL CLINICAL IMPRESSION(S) / ED DIAGNOSES  Final diagnoses:  NSTEMI (non-ST elevated myocardial infarction) [facility])     Note: This dictation was prepared with Dragon dictation. Any transcriptional errors that result from this process are unintentional     Phineas Semen, MD 01/08/18 [phone]

## 2018-01-08 NOTE — Consult Note (Signed)
 Cardiology Consult    Patient ID: Edward Powell MRN: [id number], DOB/AGE: 09/30/1970   Admit date: 01/08/2018 Date of Consult: 01/08/2018  Primary Physician: Patient, No Pcp Per Primary Cardiologist: Muhammad Arida, MD Requesting Provider: R. Wieting, MD  Patient Profile    Edward Powell is a 47 y.o. male with a history of tob abuse, etoh abuse, and untreated HTN, who is being seen today for the evaluation of NSTEMI and HTN urgency at the request of Dr. Wieting.  Past Medical History   Past Medical History:  Diagnosis Date  . Alcohol abuse   . Hypertension    a. Untreated - had lost wt @ one point and BP improved.  . Tobacco abuse     Past Surgical History:  Procedure Laterality Date  . NO PAST SURGERIES       Allergies  No Known Allergies  History of Present Illness   47-year-old-year-old male without prior cardiac history.  He does have a history of hypertension, stating that he was diagnosed several years ago but then lost weight but has slowly but surely regained it.  He has not been to a doctor in many years.  He has smoked a pack a day for most of his adult life but over the past 2 to 3 months has been smoking a pack a week.  He drinks heavily about twice a week, doing multiple vodka shots per setting.  He was in his usual state of health until about 2 to 3 months ago, when he began to experience a productive cough associated with chest congestion and occasional dyspnea.  It was at that point that he reduced his smoking.  This has persisted without fevers or chills.  He had not previously sought medical attention for this.  On the evening of July 19, he went to [employer] with his wife and while in the store, he became very short of breath.  He walked out to the car and started having chest tightness and also became diaphoretic.  He sat in the car with air conditioning on and symptoms resolved within about 15 minutes.  He had no recurrent symptoms the remainder  of Friday were on Saturday/Sunday.  This morning, he was sitting at his work desk and began coughing violently.  He was also sneezing some.  In that setting, he had sudden onset of severe 10/10 substernal chest burning associated with dyspnea, nausea, and diaphoresis.  He says he was having significant trouble getting his breath and he presented to the emergency department for further evaluation.  Here, he was markedly hypertensive with a blood pressure of 204/133.  ECG notable for subtle inferolateral ST depression.  Initial troponin was 0.81.  Chest x-ray without acute cardiopulmonary process.  Patient was placed on heparin and also seen by internal medicine and placed on antibiotics and steroids.  Patient is not actively wheezing.  Blood pressure remains elevated and continues to have 5/10 chest pain at the time that I saw him.  IV nitroglycerin added.  Inpatient Medications    . amLODipine  5 mg Oral Daily  . [START ON 01/09/2018] aspirin EC  81 mg Oral Daily  . atorvastatin  80 mg Oral q1800  . [START ON 01/09/2018] azithromycin  250 mg Oral Daily  . budesonide (PULMICORT) nebulizer solution  0.5 mg Nebulization BID  . ipratropium-albuterol  3 mL Nebulization Q6H  . methylPREDNISolone (SOLU-MEDROL) injection  40 mg Intravenous Q12H  . metoprolol tartrate  5 mg   Intravenous Once  . metoprolol tartrate  12.5 mg Oral BID    Family History    Family History  Problem Relation Age of Onset  . Hypertension Mother   . Breast cancer Mother   . Liver cancer Father   . Lung cancer Father   . Hypertension Father   . Atrial fibrillation Father    indicated that his mother is alive. He indicated that his father is alive.   Social History    Social History   Socioeconomic History  . Marital status: Married    Spouse name: Not on file  . Number of children: Not on file  . Years of education: Not on file  . Highest education level: Not on file  Occupational History  . Not on file  Social  Needs  . Financial resource strain: Not on file  . Food insecurity:    Worry: Not on file    Inability: Not on file  . Transportation needs:    Medical: Not on file    Non-medical: Not on file  Tobacco Use  . Smoking status: Current Every Day Smoker  . Smokeless tobacco: Never Used  . Tobacco comment: Was smoking a ppd until about 3 mos ago. Now smoking a pack/wk.  Substance and Sexual Activity  . Alcohol use: Yes    Alcohol/week: 9.0 - 12.0 oz    Types: 15 - 20 Shots of liquor per week    Comment: up to 7-9 shots twice weekly - vodka  . Drug use: Not Currently  . Sexual activity: Yes  Lifestyle  . Physical activity:    Days per week: Not on file    Minutes per session: Not on file  . Stress: Not on file  Relationships  . Social connections:    Talks on phone: Not on file    Gets together: Not on file    Attends religious service: Not on file    Active member of club or organization: Not on file    Attends meetings of clubs or organizations: Not on file    Relationship status: Not on file  . Intimate partner violence:    Fear of current or ex partner: Not on file    Emotionally abused: Not on file    Physically abused: Not on file    Forced sexual activity: Not on file  Other Topics Concern  . Not on file  Social History Narrative   Lives in [place] with significant other.  No children.  Does not routinely exercise.     Review of Systems    General:  No chills, fever, night sweats or weight changes.  Cardiovascular:  +++ chest pain, +++ dyspnea on exertion, no edema, orthopnea, palpitations, paroxysmal nocturnal dyspnea. Dermatological: No rash, lesions/masses Respiratory: +++ Productive cough, +++ dyspnea Urologic: No hematuria, dysuria Abdominal:   +++ nausea, no vomiting, diarrhea, bright red blood per rectum, melena, or hematemesis Neurologic:  No visual changes, wkns, changes in mental status. All other systems reviewed and are otherwise negative except as  noted above.  Physical Exam    Blood pressure (!) 198/120, pulse 90, temperature 97.9 F (36.6 C), resp. rate 16, height 5' 8" (1.727 m), weight 200 lb (90.7 kg), SpO2 96 %.  General: Pleasant, NAD Psych: Normal affect. Neuro: Alert and oriented X 3. Moves all extremities spontaneously. HEENT: Normal  Neck: Supple without bruits or JVD. Lungs:  Resp regular and unlabored, CTA. Heart: RRR no s3, s4, or murmurs. Abdomen: Soft, non-tender,   non-distended, BS + x 4.  Extremities: No clubbing, cyanosis or edema. DP/PT/Radials 2+ and equal bilaterally.  Labs     Recent Labs    01/08/18 0842  TROPONINI 0.81*   Lab Results  Component Value Date   WBC 5.9 01/08/2018   HGB 16.0 01/08/2018   HCT 44.9 01/08/2018   MCV 93.2 01/08/2018   PLT 239 01/08/2018    Recent Labs  Lab 01/08/18 0842  NA 141  K 3.5  CL 107  CO2 23  BUN 14  CREATININE 0.93  CALCIUM 8.5*  GLUCOSE 155*    Radiology Studies    Dg Chest 2 View  Result Date: 01/08/2018 CLINICAL DATA:  Mid chest pain. EXAM: CHEST - 2 VIEW COMPARISON:  Chest CT 02/18/2012 FINDINGS: Monitoring leads overlie the patient. Mild cardiomegaly. No consolidative pulmonary opacities. Elevation right hemidiaphragm. No pleural effusion or pneumothorax. Regional skeleton unremarkable. IMPRESSION: No acute cardiopulmonary process. Electronically Signed   By: Drew  Davis M.D.   On: 01/08/2018 09:12    ECG & Cardiac Imaging    RSR, 93, inflat ST dep - no old for comparison  Assessment & Plan    1.  Non-STEMI: Patient without prior cardiac history presented this morning after developing acute onset of substernal chest burning associate with nausea, diaphoresis, and dyspnea.  He had a similar episode that occurred with exertion on July 19, lasting about 15 minutes, and resolving spontaneously.  In the emergency department, he was found to be markedly hypertensive.  He continued to have 5/10 chest discomfort.  He was placed on IV nitroglycerin  and heparin and IV beta-blocker was added.  Add aspirin, high potency statin, and oral beta-blocker.  Once blood pressure improved, we will plan on diagnostic catheterization-potentially tomorrow if stable. The patient understands that risks include but are not limited to stroke (1 in 1000), death (1 in 1000), kidney failure [usually temporary] (1 in 500), bleeding (1 in 200), allergic reaction [possibly serious] (1 in 200), and agrees to proceed.   2.  Hypertensive urgency: As above, patient developed chest pain was found to be markedly hypertensive.  He was diagnosed with hypertension several years ago but has never been on medication.  IV nitroglycerin and beta-blocker added.  Titrate medications as tolerated.  Switch metoprolol to carvedilol.  Amlodipine added by medicine.  3.  Tobacco abuse: Cessation advised.  Next  4.  Alcohol abuse: Binge drinks about twice a week.  Usually shots of vodka.  Cessation advised.  Signed, Jessicah Croll, NP 01/08/2018, 12:17 PM  For questions or updates, please contact   Please consult [email or web address] for contact info under Cardiology/STEMI.   

## 2018-01-08 NOTE — Progress Notes (Signed)
[facility] [place] Consultation      Name: Edward Powell MRN: [id number] DOB: 25-May-1971    ADMISSION DATE:  01/08/2018 CONSULTATION DATE:  01/08/2018  REFERRING MD :  Renae Gloss   CHIEF COMPLAINT:   Chest Pain   HISTORY OF PRESENT ILLNESS   47 year old male who presented to the emergency department for evaluation of chest pain. Patient states he had an acute onset of chest pain this morning while sitting at his desk. He describes it as "fire" and 10/10. He reports associated nausea and diaphoresis.  Patient recalls a similar episode of chest pain on Friday which lasted 10-15 minutes, but was less intense. He also endorses a productive cough over the past month with congestion and shortness of breath. Patient has history of tinnitus and reports that it has worsened since his admission. In the emergency department patient was found to have elevated troponin of 0.81-->0.75, he was given aspirin and placed on heparin drip. Patient's chest pain was not resolved with subQ nitro and he was placed on Nitro drip per cardiology. Patient admitted to step down unit for further management.    SIGNIFICANT EVENTS  See above  PAST MEDICAL HISTORY    :  Past Medical History:  Diagnosis Date  . Alcohol abuse   . Hypertension    a. Untreated - had lost wt @ one point and BP improved.  . Tobacco abuse    Past Surgical History:  Procedure Laterality Date  . NO PAST SURGERIES     Prior to Admission medications   Not on File   No Known Allergies   FAMILY HISTORY   Family History  Problem Relation Age of Onset  . Hypertension Mother   . Breast cancer Mother   . Liver cancer Father   . Lung cancer Father   . Hypertension Father   . Atrial fibrillation Father       SOCIAL HISTORY    reports that he has been smoking.  He has never used smokeless tobacco. He reports that he drinks about 9.0 - 12.0 oz of alcohol per week. He reports that he has current or past drug  history.  Review of Systems  Constitutional: Positive for diaphoresis. Negative for chills, fever and malaise/fatigue.  HENT: Positive for congestion, sore throat and tinnitus. Negative for ear pain and sinus pain.        Positive for headache  Eyes: Negative for pain and discharge.  Respiratory: Positive for cough, sputum production and shortness of breath. Negative for hemoptysis and wheezing.   Cardiovascular: Positive for chest pain. Negative for palpitations, orthopnea, claudication, leg swelling and PND.  Gastrointestinal: Positive for nausea. Negative for abdominal pain, constipation, diarrhea, heartburn and vomiting.  Genitourinary: Negative for dysuria, frequency and urgency.  Musculoskeletal: Negative for myalgias.  Skin: Negative for itching and rash.  Neurological: Negative for dizziness, speech change, focal weakness and headaches.  Psychiatric/Behavioral: Negative for depression.      VITAL SIGNS    Temp:  [97.9 F (36.6 C)-98.1 F (36.7 C)] 98.1 F (36.7 C) (07/22 1400) Pulse Rate:  [68-105] 68 (07/22 1400) Resp:  [13-23] 14 (07/22 1400) BP: (155-224)/(104-140) 160/109 (07/22 1400) SpO2:  [92 %-99 %] 94 % (07/22 1400) Weight:  [90.7 kg (200 lb)-101.3 kg (223 lb 5.2 oz)] 101.3 kg (223 lb 5.2 oz) (07/22 1400)    INTAKE / OUTPUT:  Intake/Output Summary (Last 24 hours) at 01/08/2018 1443 Last data filed at 01/08/2018 1400 Gross per 24 hour  Intake 45.71 ml  Output -  Net 45.71 ml       PHYSICAL EXAM   Physical Exam  Constitutional: He is oriented to person, place, and time. He appears well-developed and well-nourished. No distress.  HENT:  Head: Normocephalic and atraumatic.  Eyes: Pupils are equal, round, and reactive to light. EOM are normal.  Neck: Normal range of motion.  Cardiovascular: Normal rate and regular rhythm.  Pulses:      Radial pulses are 2+ on the right side, and 2+ on the left side.       Dorsalis pedis pulses are 2+ on the right  side, and 2+ on the left side.  Pulmonary/Chest:  Clear to auscultation bilaterally, no wheezes, rales, or rhonchi  Abdominal: Soft. Bowel sounds are normal. He exhibits no distension. There is no tenderness.  Musculoskeletal:       Right lower leg: He exhibits no edema.       Left lower leg: He exhibits no edema.  Lymphadenopathy:    He has no cervical adenopathy.  Neurological: He is alert and oriented to person, place, and time. No cranial nerve deficit.  Skin: Skin is warm and dry. Capillary refill takes less than 2 seconds.  Psychiatric: He has a normal mood and affect.       LABS   LABS:  CBC Recent Labs  Lab 01/08/18 0842  WBC 5.9  HGB 16.0  HCT 44.9  PLT 239   Coag's Recent Labs  Lab 01/08/18 0842  APTT 27  INR 0.94   BMET Recent Labs  Lab 01/08/18 0842  NA 141  K 3.5  CL 107  CO2 23  BUN 14  CREATININE 0.93  GLUCOSE 155*   Electrolytes Recent Labs  Lab 01/08/18 0842  CALCIUM 8.5*   Sepsis Markers No results for input(s): LATICACIDVEN, PROCALCITON, O2SATVEN in the last 168 hours. ABG No results for input(s): PHART, PCO2ART, PO2ART in the last 168 hours. Liver Enzymes No results for input(s): AST, ALT, ALKPHOS, BILITOT, ALBUMIN in the last 168 hours. Cardiac Enzymes Recent Labs  Lab 01/08/18 0842 01/08/18 1302  TROPONINI 0.81* 0.75*   Glucose Recent Labs  Lab 01/08/18 1339  GLUCAP 119*     No results found for this or any previous visit (from the past 240 hour(s)).   Current Facility-Administered Medications:  .  acetaminophen (TYLENOL) tablet 650 mg, 650 mg, Oral, Q6H PRN **OR** acetaminophen (TYLENOL) suppository 650 mg, 650 mg, Rectal, Q6H PRN, Wieting, Richard, MD .  amLODipine (NORVASC) tablet 5 mg, 5 mg, Oral, Daily, Wieting, Richard, MD, 5 mg at 01/08/18 1130 .  [START ON 01/09/2018] aspirin EC tablet 81 mg, 81 mg, Oral, Daily, Wieting, Richard, MD .  atorvastatin (LIPITOR) tablet 80 mg, 80 mg, Oral, q1800, Creig HinesBerge,  Christopher Ronald, NP .  [address][COMPLETED] azithromycin [facility](ZITHROMAX) tablet 500 mg, 500 mg, Oral, Daily, 500 mg at 01/08/18 1130 **FOLLOWED BY** [START ON 01/09/2018] azithromycin (ZITHROMAX) tablet 250 mg, 250 mg, Oral, Daily, Wieting, Richard, MD .  budesonide (PULMICORT) nebulizer solution 0.5 mg, 0.5 mg, Nebulization, BID, Wieting, Richard, MD, 0.5 mg at 01/08/18 1441 .  heparin ADULT infusion 100 units/mL (25000 units/2950mL sodium chloride 0.45%), 1,100 Units/hr, Intravenous, Continuous, Phineas SemenGoodman, Graydon, MD, Last Rate: 11 mL/hr at 01/08/18 1400, 1,100 Units/hr at 01/08/18 1400 .  ipratropium-albuterol (DUONEB) 0.5-2.5 (3) MG/3ML nebulizer solution 3 mL, 3 mL, Nebulization, Q6H, Wieting, Richard, MD, 3 mL at 01/08/18 1441 .  methylPREDNISolone sodium succinate (SOLU-MEDROL) 40 mg/mL injection 40 mg, 40 mg,  Intravenous, Q12H, Alford Highland, MD, 40 mg at 01/08/18 1133 .  metoprolol tartrate (LOPRESSOR) tablet 12.5 mg, 12.5 mg, Oral, BID, Renae Gloss, Richard, MD, 12.5 mg at 01/08/18 1130 .  morphine 2 MG/ML injection 2 mg, 2 mg, Intravenous, Q4H PRN, Renae Gloss, Richard, MD, 2 mg at 01/08/18 1324 .  nitroGLYCERIN (NITROSTAT) SL tablet 0.4 mg, 0.4 mg, Sublingual, Q5 min PRN, Phineas Semen, MD, 0.4 mg at 01/08/18 1145 .  nitroGLYCERIN 50 mg in dextrose 5 % 250 mL (0.2 mg/mL) infusion, 0-200 mcg/min, Intravenous, Titrated, Creig Hines, NP, Last Rate: 25 mL/hr at 01/08/18 1435, 83.333 mcg/min at 01/08/18 1435 .  ondansetron (ZOFRAN) tablet 4 mg, 4 mg, Oral, Q6H PRN **OR** ondansetron (ZOFRAN) injection 4 mg, 4 mg, Intravenous, Q6H PRN, Wieting, Richard, MD .  oxyCODONE (Oxy IR/ROXICODONE) immediate release tablet 5 mg, 5 mg, Oral, Q4H PRN, Alford Highland, MD  IMAGING    Dg Chest 2 View  Result Date: 01/08/2018 CLINICAL DATA:  Mid chest pain. EXAM: CHEST - 2 VIEW COMPARISON:  Chest CT 02/18/2012 FINDINGS: Monitoring leads overlie the patient. Mild cardiomegaly. No consolidative pulmonary  opacities. Elevation right hemidiaphragm. No pleural effusion or pneumothorax. Regional skeleton unremarkable. IMPRESSION: No acute cardiopulmonary process. Electronically Signed   By: Annia Belt M.D.   On: 01/08/2018 09:12    MICRO DATA: MRSA PCR : pending UDS: pending    ASSESSMENT/PLAN   47 year old male who presented to the emergency room for chest pain. Patient with elevated troponin 0.81-->0.75 in ED. EKG shows NSR with rate of 93, normal axis, no evidence of ST elevation or depression.   CARDIOVASCULAR A: ACS/NSTEMI P: Plan per cardiology is for catheterization with possible intervention. Unlikely patient will go today for cath. Heparin and nitro drip started. Patient started on statin, beta blocker, CCB, and aspirin. Asprin likely causing worsening tinnitus.  Hypertension: Management per cardiology. Nitro drip, CCB, and BB. Headache secondary to nitro.   PULMONARY A: Productive cough secondary to tobacco abuse P:  No active cardiopulmonary disease noted on CXR today. Encouraged patient to stop smoking. Solumedrol, duoneb and Pulmicort started. Reactive airway disease vs. Early COPD vs bronchitis.    RENAL -no concerns, will trend creatinine and electrolytes -UDS ordered  GASTROINTESTINAL -no concerns  HEMATOLOGIC -heparin for ACS/NSTEMI  INFECTIOUS -no indication for antibiotics at this time  ENDOCRINE -test HbA1c for diabetes evaluation given patient's risk factors -monitor blood glucose with steroid use   NEUROLOGIC -no concerns, alert and oriented  ALCOHOL ABUSE: -patient drinks 2-3x per week, 8-10 shots of vodka each time. Has been drinking this way since as long as he can remember. Encouraged cessation of alcohol use. No history of withdrawal symptoms or seizures.     I have personally obtained a history, examined the patient, evaluated laboratory and independently reviewed  imaging results, formulated the assessment and plan and placed orders.  The  Patient requires high complexity decision making for assessment and support, frequent evaluation and titration of therapies, application of advanced monitoring technologies and extensive interpretation of multiple databases. Critical Care Time devoted to patient care services described in this note is 30 minutes.

## 2018-01-08 NOTE — ED Notes (Signed)
First Nurse Note: Patient complaining of chest pain, indicates central chest.  Taken to Room 11, Vernona RiegerLaura RN aware.

## 2018-01-08 NOTE — ED Notes (Signed)
Attempted to call report to floor. Secretary states nurse unable to take report at this time. Will call back.

## 2018-01-08 NOTE — Progress Notes (Signed)
ANTICOAGULATION CONSULT NOTE - Initial Consult  Pharmacy Consult for Heparin Indication: chest pain/ACS  No Known Allergies  Patient Measurements: Height: 5\' 8"  (172.7 cm) Weight: 223 lb 5.2 oz (101.3 kg) IBW/kg (Calculated) : 68.4 Heparin Dosing Weight: 87.1 kg  Vital Signs: Temp: 98.1 F (36.7 C) (07/22 1400) Temp Source: Oral (07/22 1400) BP: 175/111 (07/22 1717) Pulse Rate: 79 (07/22 1717)  Labs: Recent Labs    01/08/18 0842 01/08/18 1302 01/08/18 1645  HGB 16.0  --   --   HCT 44.9  --   --   PLT 239  --   --   APTT 27  --   --   LABPROT 12.5  --   --   INR 0.94  --   --   HEPARINUNFRC  --   --  0.19*  CREATININE 0.93  --   --   TROPONINI 0.81* 0.75* 0.76*    Estimated Creatinine Clearance: 112.1 mL/min (by C-G formula based on SCr of 0.93 mg/dL).   Medical History: Past Medical History:  Diagnosis Date  . Alcohol abuse   . Hypertension    a. Untreated - had lost wt @ one point and BP improved.  . Tobacco abuse     Medications:  . heparin 1,100 Units/hr (01/08/18 1400)  . nitroGLYCERIN 20 mcg/min (01/08/18 1618)    Assessment: 47 yo male admitted with chest pain  Goal of Therapy:  Heparin level 0.3-0.7 units/ml Monitor platelets by anticoagulation protocol: Yes   Plan:  Give 4000 units bolus x 1 Start heparin infusion at 1100 units/hr Check anti-Xa level in 6 hours and daily while on heparin Continue to monitor H&H and platelets  01/08/18 16:45 HL subtherapeutic x 1. 2600 units IV x 1 bolus and increase rate to 1400 units/hr. Recheck HL in 6 hours.  Ileen Kahre A. [place], [place]arm.D., BCPS 01/08/2018,5:24 PM

## 2018-01-08 NOTE — H&P (Signed)
[facility] [place] [facility]   PATIENT NAME: Edward JubaDarin Powell    MR#:  [id number]  DATE OF BIRTH:  [birth year]  DATE OF ADMISSION:  01/08/2018  PRIMARY CARE PHYSICIAN: Patient, No Pcp Per   REQUESTING/REFERRING PHYSICIAN: Dr Tery SanfilippoGrayden Goodman  CHIEF COMPLAINT:   Chief Complaint  Patient presents with  . Chest Pain    HISTORY OF PRESENT ILLNESS:  Edward Powell  is a 47 y.o. male with a known history of hypertension.  He states that he has not been feeling well for the past month.  He has been coughing up yellow phlegm.  He was given some antibiotics previously and told he may have had a touch of pneumonia.  This morning he felt like his chest was on fire.  8 out of 10 intensity discomfort.  It eased off.  Associated with shortness of breath.  He felt like he was going to pass out and he felt very warm.  Friday he had some similar symptoms.  In the ER, his troponin was found to be 0.8.  ER physician started on a heparin drip and gave aspirin.  Blood pressure elevated in the emergency room.  Patient coughing quite a bit when I was seeing him.  PAST MEDICAL HISTORY:   Past Medical History:  Diagnosis Date  . Alcohol abuse   . Hypertension    a. Untreated - had lost wt @ one point and BP improved.  . Tobacco abuse     PAST SURGICAL HISTORY:   Past Surgical History:  Procedure Laterality Date  . NO PAST SURGERIES      SOCIAL HISTORY:   Social History   Tobacco Use  . Smoking status: Current Every Day Smoker  . Smokeless tobacco: Never Used  . Tobacco comment: Was smoking a ppd until about 3 mos ago. Now smoking a pack/wk.  Substance Use Topics  . Alcohol use: Yes    Alcohol/week: 9.0 - 12.0 oz    Types: 15 - 20 Shots of liquor per week    Comment: up to 7-9 shots twice weekly - vodka    FAMILY HISTORY:   Family History  Problem Relation Age of Onset  . Hypertension Mother   . Breast cancer Mother   . Liver cancer Father   . Lung cancer  Father   . Hypertension Father   . Atrial fibrillation Father     DRUG ALLERGIES:  No Known Allergies  REVIEW OF SYSTEMS:  CONSTITUTIONAL: No fever, positive for chills and sweats.  Positive for fatigue.   Positive for weight gain 60 pounds over the last few months EYES: No blurred or double vision.  EARS, NOSE, AND THROAT: Positive for tinnitus.  Positive for runny nose postnasal drip and sore throat RESPIRATORY: Positive for cough, shortness of breath, and wheezing.  No hemoptysis.  CARDIOVASCULAR:  positive for chest pain.  GASTROINTESTINAL: Positive for  Nausea.  No vomiting, diarrhea or abdominal pain. No blood in bowel movements GENITOURINARY: No dysuria, hematuria.  ENDOCRINE: No polyuria, nocturia,  HEMATOLOGY: No anemia, easy bruising or bleeding SKIN: Noticed some red splotchy areas on his legs MUSCULOSKELETAL: No joint pain or arthritis.   NEUROLOGIC: No tingling, numbness, weakness.  PSYCHIATRY: No anxiety or depression.   MEDICATIONS AT HOME:   Prior to Admission medications   Not on File   Patient does not take any medications.  VITAL SIGNS:  Blood pressure (!) 198/120, pulse 90, temperature 97.9 F (36.6 C), resp. rate 16, height 5'  8" (1.727 m), weight 90.7 kg (200 lb), SpO2 96 %.  PHYSICAL EXAMINATION:  GENERAL:  47 y.o.-year-old patient lying in the bed with no acute distress.  EYES: Pupils equal, round, reactive to light and accommodation. No scleral icterus. Extraocular muscles intact.  HEENT: Head atraumatic, normocephalic. Oropharynx and nasopharynx clear.  NECK:  Supple, no jugular venous distention. No thyroid enlargement, no tenderness.  LUNGS: Normal breath sounds bilaterally, no wheezing, rales,rhonchi or crepitation. No use of accessory muscles of respiration.  CARDIOVASCULAR: S1, S2 normal. No murmurs, rubs, or gallops.  ABDOMEN: Soft, nontender, nondistended. Bowel sounds present. No organomegaly or mass.  EXTREMITIES:  trace edema, no cyanosis,  or clubbing.  NEUROLOGIC: Cranial nerves II through XII are intact. Muscle strength 5/5 in all extremities. Sensation intact. Gait not checked.  PSYCHIATRIC: The patient is alert and oriented x 3.  SKIN: No rash, lesion, or ulcer.   LABORATORY PANEL:   CBC Recent Labs  Lab 01/08/18 0842  WBC 5.9  HGB 16.0  HCT 44.9  PLT 239   ------------------------------------------------------------------------------------------------------------------  Chemistries  Recent Labs  Lab 01/08/18 0842  NA 141  K 3.5  CL 107  CO2 23  GLUCOSE 155*  BUN 14  CREATININE 0.93  CALCIUM 8.5*   ------------------------------------------------------------------------------------------------------------------  Cardiac Enzymes Recent Labs  Lab 01/08/18 0842  TROPONINI 0.81*   ------------------------------------------------------------------------------------------------------------------  RADIOLOGY:  Dg Chest 2 View  Result Date: 01/08/2018 CLINICAL DATA:  Mid chest pain. EXAM: CHEST - 2 VIEW COMPARISON:  Chest CT 02/18/2012 FINDINGS: Monitoring leads overlie the patient. Mild cardiomegaly. No consolidative pulmonary opacities. Elevation right hemidiaphragm. No pleural effusion or pneumothorax. Regional skeleton unremarkable. IMPRESSION: No acute cardiopulmonary process. Electronically Signed   By: Annia Belt M.D.   On: 01/08/2018 09:12    EKG:   Normal sinus rhythm 93 bpm no acute ST-T wave changes  IMPRESSION AND PLAN:   1.  Asthmatic bronchitis.  Start Solu-Medrol nebulizer treatments and Zithromax 2.  Chest pain with elevated troponin.  Started on aspirin, metoprolol and heparin drip.  I called cardiology to see the patient and they started on nitro drip.  Patient will have to be admitted to the CCU stepdown with the nitro drip.  Check a lipid profile in the morning. 3.  Accelerated hypertension I started metoprolol and Norvasc.  Also nitro drip should help out with the blood  pressure. 4.  Impaired fasting glucose check a hemoglobin A1c 5.  Alcohol abuse.  Drinks 2-3 times per week 8 or 9 shots at a time.  Advised to cut back on alcohol 6.  Smoker.  Patient smokes only 1 to 2cigarettes/day.  Advised to stop smoking.  Smoking cessation counseling done 4 minutes by me.  No need for nicotine patch  All the records are reviewed and case discussed with ED provider. Management plans discussed with the patient, family and they are in agreement.  CODE STATUS: Full code  TOTAL TIME TAKING CARE OF THIS PATIENT: 50 minutes.    Alford Highland M.D on 01/08/2018 at 12:44 PM  Between 7am to 6pm - Pager - [phone]  After 6pm call admission pager [phone]  [facility] Office  [phone]  CC: Primary care physician; Patient, No Pcp Per

## 2018-01-09 ENCOUNTER — Inpatient Hospital Stay (HOSPITAL_COMMUNITY): Payer: BLUE CROSS/BLUE SHIELD

## 2018-01-09 ENCOUNTER — Other Ambulatory Visit: Payer: Self-pay

## 2018-01-09 ENCOUNTER — Encounter
Admission: EM | Disposition: A | Discharge status: Other Acute Inpatient Hospital | Payer: Self-pay | Source: Home / Self Care | Attending: Internal Medicine

## 2018-01-09 ENCOUNTER — Inpatient Hospital Stay (HOSPITAL_COMMUNITY)
Admission: AD | Admit: 2018-01-09 | Discharge: 2018-01-15 | DRG: 236 | Disposition: A | Discharge status: Home / Self Care | Payer: BLUE CROSS/BLUE SHIELD | Source: Other Acute Inpatient Hospital | Attending: Cardiothoracic Surgery | Admitting: Cardiothoracic Surgery

## 2018-01-09 DIAGNOSIS — I252 Old myocardial infarction: Secondary | ICD-10-CM

## 2018-01-09 DIAGNOSIS — I5031 Acute diastolic (congestive) heart failure: Secondary | ICD-10-CM

## 2018-01-09 DIAGNOSIS — I2510 Atherosclerotic heart disease of native coronary artery without angina pectoris: Secondary | ICD-10-CM | POA: Diagnosis not present

## 2018-01-09 DIAGNOSIS — I25119 Atherosclerotic heart disease of native coronary artery with unspecified angina pectoris: Secondary | ICD-10-CM

## 2018-01-09 DIAGNOSIS — I214 Non-ST elevation (NSTEMI) myocardial infarction: Principal | ICD-10-CM | POA: Diagnosis present

## 2018-01-09 DIAGNOSIS — Z8249 Family history of ischemic heart disease and other diseases of the circulatory system: Secondary | ICD-10-CM | POA: Diagnosis not present

## 2018-01-09 DIAGNOSIS — Z7982 Long term (current) use of aspirin: Secondary | ICD-10-CM

## 2018-01-09 DIAGNOSIS — J9811 Atelectasis: Secondary | ICD-10-CM | POA: Diagnosis not present

## 2018-01-09 DIAGNOSIS — Z79899 Other long term (current) drug therapy: Secondary | ICD-10-CM

## 2018-01-09 DIAGNOSIS — E8770 Fluid overload, unspecified: Secondary | ICD-10-CM | POA: Diagnosis not present

## 2018-01-09 DIAGNOSIS — J939 Pneumothorax, unspecified: Secondary | ICD-10-CM

## 2018-01-09 DIAGNOSIS — I200 Unstable angina: Secondary | ICD-10-CM | POA: Diagnosis not present

## 2018-01-09 DIAGNOSIS — R7301 Impaired fasting glucose: Secondary | ICD-10-CM | POA: Diagnosis not present

## 2018-01-09 DIAGNOSIS — Z951 Presence of aortocoronary bypass graft: Secondary | ICD-10-CM

## 2018-01-09 DIAGNOSIS — E785 Hyperlipidemia, unspecified: Secondary | ICD-10-CM | POA: Diagnosis present

## 2018-01-09 DIAGNOSIS — R079 Chest pain, unspecified: Secondary | ICD-10-CM | POA: Diagnosis not present

## 2018-01-09 DIAGNOSIS — Z716 Tobacco abuse counseling: Secondary | ICD-10-CM | POA: Diagnosis not present

## 2018-01-09 DIAGNOSIS — F1010 Alcohol abuse, uncomplicated: Secondary | ICD-10-CM | POA: Diagnosis not present

## 2018-01-09 DIAGNOSIS — D62 Acute posthemorrhagic anemia: Secondary | ICD-10-CM | POA: Diagnosis not present

## 2018-01-09 DIAGNOSIS — I5030 Unspecified diastolic (congestive) heart failure: Secondary | ICD-10-CM

## 2018-01-09 DIAGNOSIS — Z7141 Alcohol abuse counseling and surveillance of alcoholic: Secondary | ICD-10-CM

## 2018-01-09 DIAGNOSIS — J90 Pleural effusion, not elsewhere classified: Secondary | ICD-10-CM | POA: Diagnosis not present

## 2018-01-09 DIAGNOSIS — R7303 Prediabetes: Secondary | ICD-10-CM | POA: Diagnosis not present

## 2018-01-09 DIAGNOSIS — Z01810 Encounter for preprocedural cardiovascular examination: Secondary | ICD-10-CM

## 2018-01-09 DIAGNOSIS — I119 Hypertensive heart disease without heart failure: Secondary | ICD-10-CM | POA: Diagnosis present

## 2018-01-09 DIAGNOSIS — F17210 Nicotine dependence, cigarettes, uncomplicated: Secondary | ICD-10-CM | POA: Diagnosis not present

## 2018-01-09 DIAGNOSIS — I160 Hypertensive urgency: Secondary | ICD-10-CM | POA: Diagnosis not present

## 2018-01-09 DIAGNOSIS — I471 Supraventricular tachycardia: Secondary | ICD-10-CM | POA: Diagnosis not present

## 2018-01-09 DIAGNOSIS — I10 Essential (primary) hypertension: Secondary | ICD-10-CM | POA: Diagnosis not present

## 2018-01-09 DIAGNOSIS — J45909 Unspecified asthma, uncomplicated: Secondary | ICD-10-CM | POA: Diagnosis not present

## 2018-01-09 DIAGNOSIS — Z4682 Encounter for fitting and adjustment of non-vascular catheter: Secondary | ICD-10-CM | POA: Diagnosis not present

## 2018-01-09 DIAGNOSIS — D696 Thrombocytopenia, unspecified: Secondary | ICD-10-CM | POA: Diagnosis present

## 2018-01-09 DIAGNOSIS — I25110 Atherosclerotic heart disease of native coronary artery with unstable angina pectoris: Secondary | ICD-10-CM | POA: Diagnosis present

## 2018-01-09 DIAGNOSIS — I25118 Atherosclerotic heart disease of native coronary artery with other forms of angina pectoris: Secondary | ICD-10-CM

## 2018-01-09 DIAGNOSIS — R748 Abnormal levels of other serum enzymes: Secondary | ICD-10-CM | POA: Diagnosis not present

## 2018-01-09 HISTORY — PX: LEFT HEART CATH AND CORONARY ANGIOGRAPHY: CATH118249

## 2018-01-09 LAB — CBC
HCT: 44.9 % (ref 40.0–52.0)
HEMOGLOBIN: 15.8 g/dL (ref 13.0–18.0)
MCH: 33.1 pg (ref 26.0–34.0)
MCHC: 35.2 g/dL (ref 32.0–36.0)
MCV: 93.8 fL (ref 80.0–100.0)
Platelets: 245 10*3/uL (ref 150–440)
RBC: 4.79 MIL/uL (ref 4.40–5.90)
RDW: 13.6 % (ref 11.5–14.5)
WBC: 9.6 10*3/uL (ref 3.8–10.6)

## 2018-01-09 LAB — COMPREHENSIVE METABOLIC PANEL
ALBUMIN: 3.7 g/dL (ref 3.5–5.0)
ALT: 103 U/L — AB (ref 0–44)
ANION GAP: 10 (ref 5–15)
AST: 44 U/L — ABNORMAL HIGH (ref 15–41)
Alkaline Phosphatase: 77 U/L (ref 38–126)
BUN: 14 mg/dL (ref 6–20)
CHLORIDE: 101 mmol/L (ref 98–111)
CO2: 27 mmol/L (ref 22–32)
Calcium: 9.7 mg/dL (ref 8.9–10.3)
Creatinine, Ser: 0.99 mg/dL (ref 0.61–1.24)
GFR calc non Af Amer: >60 mL/min (ref >60)
Glucose, Bld: 151 mg/dL — ABNORMAL HIGH (ref 70–99)
Potassium: 4.3 mmol/L (ref 3.5–5.1)
SODIUM: 138 mmol/L (ref 135–145)
Total Bilirubin: 0.9 mg/dL (ref 0.3–1.2)
Total Protein: 7.5 g/dL (ref 6.5–8.1)

## 2018-01-09 LAB — LIPID PANEL
Cholesterol: 318 mg/dL — ABNORMAL HIGH (ref 0–200)
HDL: 58 mg/dL (ref >40)
LDL CALC: 207 mg/dL — AB (ref 0–99)
Total CHOL/HDL Ratio: 5.5 RATIO
Triglycerides: 265 mg/dL — ABNORMAL HIGH (ref <150)
VLDL: 53 mg/dL — ABNORMAL HIGH (ref 0–40)

## 2018-01-09 LAB — BLOOD GAS, ARTERIAL
Acid-Base Excess: 0.9 mmol/L (ref 0.0–2.0)
Bicarbonate: 24 mmol/L (ref 20.0–28.0)
Drawn by: 236041
FIO2: 21
O2 Saturation: 95.2 %
Patient temperature: 98.6
pCO2 arterial: 31.7 mmHg — ABNORMAL LOW (ref 32.0–48.0)
pH, Arterial: 7.491 — ABNORMAL HIGH (ref 7.350–7.450)
pO2, Arterial: 73.9 mmHg — ABNORMAL LOW (ref 83.0–108.0)

## 2018-01-09 LAB — PULMONARY FUNCTION TEST
FEF 25-75 Post: 1.68 L/sec
FEF 25-75 Pre: 2.71 L/sec
FEF2575-%Change-Post: -38 %
FEF2575-%Pred-Post: 49 %
FEF2575-%Pred-Pre: 80 %
FEV1-%Change-Post: -20 %
FEV1-%Pred-Post: 62 %
FEV1-%Pred-Pre: 78 %
FEV1-Post: 2.31 L
FEV1-Pre: 2.91 L
FEV1FVC-%Change-Post: -22 %
FEV1FVC-%Pred-Pre: 96 %
FEV6-%Change-Post: 8 %
FEV6-%Pred-Post: 85 %
FEV6-%Pred-Pre: 78 %
FEV6-Post: 3.91 L
FEV6-Pre: 3.6 L
FEV6FVC-%Change-Post: 0 %
FEV6FVC-%Pred-Post: 102 %
FEV6FVC-%Pred-Pre: 102 %
FVC-%Change-Post: 1 %
FVC-%Pred-Post: 82 %
FVC-%Pred-Pre: 81 %
FVC-Post: 3.93 L
FVC-Pre: 3.86 L
Post FEV1/FVC ratio: 59 %
Post FEV6/FVC ratio: 99 %
Pre FEV1/FVC ratio: 75 %
Pre FEV6/FVC Ratio: 100 %

## 2018-01-09 LAB — BASIC METABOLIC PANEL
ANION GAP: 10 (ref 5–15)
BUN: 15 mg/dL (ref 6–20)
CHLORIDE: 103 mmol/L (ref 98–111)
CO2: 24 mmol/L (ref 22–32)
Calcium: 9.5 mg/dL (ref 8.9–10.3)
Creatinine, Ser: 0.83 mg/dL (ref 0.61–1.24)
GFR calc Af Amer: >60 mL/min (ref >60)
Glucose, Bld: 161 mg/dL — ABNORMAL HIGH (ref 70–99)
POTASSIUM: 4.4 mmol/L (ref 3.5–5.1)
SODIUM: 137 mmol/L (ref 135–145)

## 2018-01-09 LAB — ECHOCARDIOGRAM COMPLETE
Height: 68 in
Weight: 3584 oz

## 2018-01-09 LAB — MAGNESIUM: MAGNESIUM: 2.2 mg/dL (ref 1.7–2.4)

## 2018-01-09 LAB — HEPARIN LEVEL (UNFRACTIONATED)
Heparin Unfractionated: 0.42 IU/mL (ref 0.30–0.70)
Heparin Unfractionated: 0.44 IU/mL (ref 0.30–0.70)

## 2018-01-09 LAB — HEMOGLOBIN A1C
Hgb A1c MFr Bld: 5.7 % — ABNORMAL HIGH (ref 4.8–5.6)
Mean Plasma Glucose: 116.89 mg/dL

## 2018-01-09 LAB — SURGICAL PCR SCREEN
MRSA, PCR: NEGATIVE
STAPHYLOCOCCUS AUREUS: NEGATIVE

## 2018-01-09 LAB — TROPONIN I: TROPONIN I: 0.43 ng/mL — AB (ref <0.03)

## 2018-01-09 SURGERY — LEFT HEART CATH AND CORONARY ANGIOGRAPHY
Anesthesia: Moderate Sedation

## 2018-01-09 MED ORDER — TRANEXAMIC ACID (OHS) PUMP PRIME SOLUTION
2.0000 mg/kg | INTRAVENOUS | Status: DC
  Filled 2018-01-09: qty 2.03

## 2018-01-09 MED ORDER — POTASSIUM CHLORIDE 2 MEQ/ML IV SOLN
80.0000 meq | INTRAVENOUS | Status: DC
  Filled 2018-01-09: qty 40

## 2018-01-09 MED ORDER — MIDAZOLAM HCL 2 MG/2ML IJ SOLN
INTRAMUSCULAR | Status: DC | PRN
  Administered 2018-01-09: 1 mg via INTRAVENOUS

## 2018-01-09 MED ORDER — CHLORHEXIDINE GLUCONATE 0.12 % MT SOLN
15.0000 mL | Freq: Once | OROMUCOSAL | Status: AC
  Administered 2018-01-10: 15 mL via OROMUCOSAL
  Filled 2018-01-09: qty 15

## 2018-01-09 MED ORDER — LOSARTAN POTASSIUM 25 MG PO TABS: 25.0000 mg | ORAL_TABLET | Freq: Every day | ORAL | Status: DC

## 2018-01-09 MED ORDER — MORPHINE SULFATE (PF) 2 MG/ML IV SOLN: 2.0000 mg | INTRAVENOUS | 0 refills | Status: DC | PRN

## 2018-01-09 MED ORDER — CHLORHEXIDINE GLUCONATE CLOTH 2 % EX PADS
6.0000 | MEDICATED_PAD | Freq: Once | CUTANEOUS | Status: AC
  Administered 2018-01-09: 6 via TOPICAL

## 2018-01-09 MED ORDER — CEFUROXIME SODIUM 1.5 G IV SOLR
1.5000 g | INTRAVENOUS | Status: AC
  Administered 2018-01-10: .75 g via INTRAVENOUS
  Administered 2018-01-10: 1.5 g via INTRAVENOUS
  Filled 2018-01-09: qty 1.5

## 2018-01-09 MED ORDER — AZITHROMYCIN 250 MG PO TABS: ORAL_TABLET | ORAL | 0 refills | Status: DC

## 2018-01-09 MED ORDER — HEPARIN SODIUM (PORCINE) 1000 UNIT/ML IJ SOLN
INTRAMUSCULAR | Status: DC | PRN
  Administered 2018-01-09: 5000 [IU] via INTRAVENOUS

## 2018-01-09 MED ORDER — TRANEXAMIC ACID (OHS) BOLUS VIA INFUSION
15.0000 mg/kg | INTRAVENOUS | Status: AC
  Administered 2018-01-10: 1524 mg via INTRAVENOUS
  Filled 2018-01-09: qty 1524

## 2018-01-09 MED ORDER — ASPIRIN EC 81 MG PO TBEC: 81.0000 mg | DELAYED_RELEASE_TABLET | Freq: Every day | ORAL | Status: DC

## 2018-01-09 MED ORDER — DEXTROSE 5 % IV SOLN
0.0000 ug/min | INTRAVENOUS | Status: DC
  Filled 2018-01-09: qty 4

## 2018-01-09 MED ORDER — HEPARIN SODIUM (PORCINE) 1000 UNIT/ML IJ SOLN
INTRAMUSCULAR | Status: AC
  Filled 2018-01-09: qty 1

## 2018-01-09 MED ORDER — ASPIRIN 81 MG PO TBEC: 81.0000 mg | DELAYED_RELEASE_TABLET | Freq: Every day | ORAL | Status: DC

## 2018-01-09 MED ORDER — PLASMA-LYTE 148 IV SOLN
INTRAVENOUS | Status: DC
  Filled 2018-01-09: qty 2.5

## 2018-01-09 MED ORDER — ACETAMINOPHEN 325 MG PO TABS
650.0000 mg | ORAL_TABLET | Freq: Four times a day (QID) | ORAL | Status: DC | PRN
  Administered 2018-01-09: 650 mg via ORAL
  Filled 2018-01-09: qty 2

## 2018-01-09 MED ORDER — NITROGLYCERIN 0.4 MG SL SUBL: 0.4000 mg | SUBLINGUAL_TABLET | SUBLINGUAL | 12 refills | Status: DC | PRN

## 2018-01-09 MED ORDER — FENTANYL CITRATE (PF) 100 MCG/2ML IJ SOLN
INTRAMUSCULAR | Status: AC
  Filled 2018-01-09: qty 2

## 2018-01-09 MED ORDER — SODIUM CHLORIDE 0.9 % IV SOLN
30.0000 ug/min | INTRAVENOUS | Status: AC
  Administered 2018-01-10: 15 ug/min via INTRAVENOUS
  Filled 2018-01-09: qty 2

## 2018-01-09 MED ORDER — ONDANSETRON HCL 4 MG/2ML IJ SOLN: 4.0000 mg | Freq: Four times a day (QID) | INTRAMUSCULAR | Status: DC | PRN

## 2018-01-09 MED ORDER — FUROSEMIDE 10 MG/ML IJ SOLN
40.0000 mg | Freq: Two times a day (BID) | INTRAMUSCULAR | Status: DC
  Administered 2018-01-09: 40 mg via INTRAVENOUS
  Filled 2018-01-09: qty 4

## 2018-01-09 MED ORDER — HEPARIN (PORCINE) IN NACL 100-0.45 UNIT/ML-% IJ SOLN: 1400.0000 [IU]/h | INTRAMUSCULAR | Status: DC

## 2018-01-09 MED ORDER — SODIUM CHLORIDE 0.9% FLUSH: 3.0000 mL | INTRAVENOUS | Status: DC | PRN

## 2018-01-09 MED ORDER — HEPARIN (PORCINE) IN NACL 1000-0.9 UT/500ML-% IV SOLN
INTRAVENOUS | Status: AC
  Filled 2018-01-09: qty 1000

## 2018-01-09 MED ORDER — SODIUM CHLORIDE 0.9 % IV SOLN
750.0000 mg | INTRAVENOUS | Status: DC
  Filled 2018-01-09 (×2): qty 750

## 2018-01-09 MED ORDER — FUROSEMIDE 10 MG/ML IJ SOLN
INTRAMUSCULAR | Status: AC
Start: 2018-01-09 — End: ?
  Filled 2018-01-09: qty 4

## 2018-01-09 MED ORDER — TEMAZEPAM 15 MG PO CAPS
15.0000 mg | ORAL_CAPSULE | Freq: Once | ORAL | Status: AC | PRN
  Administered 2018-01-09: 15 mg via ORAL
  Filled 2018-01-09: qty 1

## 2018-01-09 MED ORDER — HYDRALAZINE HCL 20 MG/ML IJ SOLN
10.0000 mg | Freq: Once | INTRAMUSCULAR | Status: AC
  Administered 2018-01-09: 10 mg via INTRAVENOUS

## 2018-01-09 MED ORDER — ONDANSETRON HCL 4 MG PO TABS: 4.0000 mg | ORAL_TABLET | Freq: Four times a day (QID) | ORAL | 0 refills | Status: DC | PRN

## 2018-01-09 MED ORDER — IPRATROPIUM-ALBUTEROL 0.5-2.5 (3) MG/3ML IN SOLN: 3.0000 mL | Freq: Four times a day (QID) | RESPIRATORY_TRACT | Status: DC

## 2018-01-09 MED ORDER — IPRATROPIUM-ALBUTEROL 0.5-2.5 (3) MG/3ML IN SOLN
3.0000 mL | Freq: Four times a day (QID) | RESPIRATORY_TRACT | Status: DC
  Administered 2018-01-09 (×2): 3 mL via RESPIRATORY_TRACT
  Filled 2018-01-09 (×2): qty 3

## 2018-01-09 MED ORDER — DEXMEDETOMIDINE HCL IN NACL 400 MCG/100ML IV SOLN
0.1000 ug/kg/h | INTRAVENOUS | Status: AC
  Administered 2018-01-10: .3 ug/kg/h via INTRAVENOUS
  Filled 2018-01-09: qty 100

## 2018-01-09 MED ORDER — ATORVASTATIN CALCIUM 80 MG PO TABS
80.0000 mg | ORAL_TABLET | Freq: Every day | ORAL | Status: DC
  Administered 2018-01-09 – 2018-01-14 (×5): 80 mg via ORAL
  Filled 2018-01-09 (×5): qty 1

## 2018-01-09 MED ORDER — HYDRALAZINE HCL 20 MG/ML IJ SOLN
INTRAMUSCULAR | Status: AC
  Filled 2018-01-09: qty 1

## 2018-01-09 MED ORDER — MAGNESIUM SULFATE 50 % IJ SOLN
40.0000 meq | INTRAMUSCULAR | Status: DC
  Filled 2018-01-09: qty 9.85

## 2018-01-09 MED ORDER — HEPARIN (PORCINE) IN NACL 100-0.45 UNIT/ML-% IJ SOLN
1400.0000 [IU]/h | INTRAMUSCULAR | Status: DC
  Administered 2018-01-09: 1400 [IU]/h via INTRAVENOUS
  Filled 2018-01-09: qty 250

## 2018-01-09 MED ORDER — FUROSEMIDE 10 MG/ML IJ SOLN
INTRAMUSCULAR | Status: DC | PRN
  Administered 2018-01-09: 40 mg via INTRAVENOUS

## 2018-01-09 MED ORDER — ACETAMINOPHEN 325 MG PO TABS: 650.0000 mg | ORAL_TABLET | Freq: Four times a day (QID) | ORAL | Status: DC | PRN

## 2018-01-09 MED ORDER — VERAPAMIL HCL 2.5 MG/ML IV SOLN
INTRAVENOUS | Status: DC | PRN
  Administered 2018-01-09: 2.5 mg via INTRAVENOUS

## 2018-01-09 MED ORDER — ATORVASTATIN CALCIUM 80 MG PO TABS: 80.0000 mg | ORAL_TABLET | Freq: Every day | ORAL | Status: DC

## 2018-01-09 MED ORDER — CARVEDILOL 12.5 MG PO TABS: 12.5000 mg | ORAL_TABLET | Freq: Two times a day (BID) | ORAL | Status: DC

## 2018-01-09 MED ORDER — INSULIN REGULAR HUMAN 100 UNIT/ML IJ SOLN
INTRAMUSCULAR | Status: AC
  Administered 2018-01-10: 1 [IU]/h via INTRAVENOUS
  Filled 2018-01-09: qty 1

## 2018-01-09 MED ORDER — BISACODYL 5 MG PO TBEC
5.0000 mg | DELAYED_RELEASE_TABLET | Freq: Once | ORAL | Status: AC
  Administered 2018-01-09: 5 mg via ORAL
  Filled 2018-01-09: qty 1

## 2018-01-09 MED ORDER — MILRINONE LACTATE IN DEXTROSE 20-5 MG/100ML-% IV SOLN
0.1250 ug/kg/min | INTRAVENOUS | Status: DC
  Filled 2018-01-09: qty 100

## 2018-01-09 MED ORDER — TRANEXAMIC ACID 1000 MG/10ML IV SOLN
1.5000 mg/kg/h | INTRAVENOUS | Status: AC
  Administered 2018-01-10: 1.5 mg/kg/h via INTRAVENOUS
  Filled 2018-01-09: qty 25

## 2018-01-09 MED ORDER — VANCOMYCIN HCL 10 G IV SOLR
1500.0000 mg | INTRAVENOUS | Status: AC
  Administered 2018-01-10: 1500 mg via INTRAVENOUS
  Filled 2018-01-09: qty 1500

## 2018-01-09 MED ORDER — METOPROLOL TARTRATE 12.5 MG HALF TABLET
12.5000 mg | ORAL_TABLET | Freq: Once | ORAL | Status: AC
  Administered 2018-01-10: 12.5 mg via ORAL
  Filled 2018-01-09: qty 1

## 2018-01-09 MED ORDER — IOPAMIDOL (ISOVUE-300) INJECTION 61%
INTRAVENOUS | Status: DC | PRN
  Administered 2018-01-09: 95 mL via INTRA_ARTERIAL

## 2018-01-09 MED ORDER — SODIUM CHLORIDE 0.9% FLUSH: 3.0000 mL | Freq: Two times a day (BID) | INTRAVENOUS | Status: DC

## 2018-01-09 MED ORDER — MIDAZOLAM HCL 2 MG/2ML IJ SOLN
INTRAMUSCULAR | Status: AC
  Filled 2018-01-09: qty 2

## 2018-01-09 MED ORDER — LIDOCAINE HCL (PF) 1 % IJ SOLN
INTRAMUSCULAR | Status: AC
  Filled 2018-01-09: qty 30

## 2018-01-09 MED ORDER — NITROGLYCERIN IN D5W 200-5 MCG/ML-% IV SOLN
2.0000 ug/min | INTRAVENOUS | Status: AC
  Administered 2018-01-10: 16.6 ug/min via INTRAVENOUS
  Filled 2018-01-09: qty 250

## 2018-01-09 MED ORDER — SODIUM CHLORIDE 0.9 % IV SOLN
250.0000 mL | INTRAVENOUS | Status: DC | PRN
  Administered 2018-01-09: 250 mL via INTRAVENOUS

## 2018-01-09 MED ORDER — ONDANSETRON HCL 4 MG PO TABS: 4.0000 mg | ORAL_TABLET | Freq: Four times a day (QID) | ORAL | Status: DC | PRN

## 2018-01-09 MED ORDER — CHLORHEXIDINE GLUCONATE CLOTH 2 % EX PADS
6.0000 | MEDICATED_PAD | Freq: Once | CUTANEOUS | Status: AC
  Administered 2018-01-10: 6 via TOPICAL

## 2018-01-09 MED ORDER — CARVEDILOL 12.5 MG PO TABS
12.5000 mg | ORAL_TABLET | Freq: Two times a day (BID) | ORAL | Status: DC
  Administered 2018-01-09: 12.5 mg via ORAL
  Filled 2018-01-09: qty 1

## 2018-01-09 MED ORDER — DOPAMINE-DEXTROSE 3.2-5 MG/ML-% IV SOLN
0.0000 ug/kg/min | INTRAVENOUS | Status: AC
  Administered 2018-01-10: 3 ug/kg/min via INTRAVENOUS
  Filled 2018-01-09: qty 250

## 2018-01-09 MED ORDER — AMLODIPINE BESYLATE 5 MG PO TABS: 5.0000 mg | ORAL_TABLET | Freq: Every day | ORAL | Status: DC

## 2018-01-09 MED ORDER — VERAPAMIL HCL 2.5 MG/ML IV SOLN
INTRAVENOUS | Status: AC
  Filled 2018-01-09: qty 2

## 2018-01-09 MED ORDER — MORPHINE SULFATE (PF) 2 MG/ML IV SOLN: 2.0000 mg | INTRAVENOUS | Status: DC | PRN

## 2018-01-09 MED ORDER — FENTANYL CITRATE (PF) 100 MCG/2ML IJ SOLN
INTRAMUSCULAR | Status: DC | PRN
  Administered 2018-01-09: 50 ug via INTRAVENOUS

## 2018-01-09 MED ORDER — NITROGLYCERIN 0.4 MG SL SUBL: 0.4000 mg | SUBLINGUAL_TABLET | SUBLINGUAL | Status: DC | PRN

## 2018-01-09 MED ORDER — SODIUM CHLORIDE 0.9 % IV SOLN
INTRAVENOUS | Status: DC
  Filled 2018-01-09: qty 30

## 2018-01-09 SURGICAL SUPPLY — 8 items
CATH INFINITI 5FR ANG PIGTAIL (CATHETERS) ×2 IMPLANT
CATH INFINITI 5FR TG (CATHETERS) ×2 IMPLANT
CATH INFINITI JR4 5F (CATHETERS) ×2 IMPLANT
DEVICE RAD TR BAND REGULAR (VASCULAR PRODUCTS) ×2 IMPLANT
KIT MANI 3VAL PERCEP (MISCELLANEOUS) ×2 IMPLANT
PACK CARDIAC CATH (CUSTOM PROCEDURE TRAY) ×2 IMPLANT
SHEATH RAIN RADIAL 21G 6FR (SHEATH) ×2 IMPLANT
WIRE ROSEN-J .035X260CM (WIRE) ×2 IMPLANT

## 2018-01-09 NOTE — Interval H&P Note (Signed)
History and Physical Interval Note:  01/09/2018 9:52 AM  Edward Powell  has presented today for cardiac catheterization, with the diagnosis of NSTEMI. The various methods of treatment have been discussed with the patient and family. After consideration of risks, benefits and other options for treatment, the patient has consented to  Procedure(s): LEFT HEART CATH AND CORONARY ANGIOGRAPHY (N/A) as a surgical intervention .  The patient's history has been reviewed, patient examined, no change in status, stable for surgery.  I have reviewed the patient's chart and labs.  Questions were answered to the patient's satisfaction.    Cath Lab Visit (complete for each Cath Lab visit)  Clinical Evaluation Leading to the Procedure:   ACS: Yes.    Non-ACS:  N/A  Leonte Horrigan

## 2018-01-09 NOTE — Progress Notes (Signed)
[id number] Gave pt and family  OHS booklet, care guide, and in the tube handouts. Wrote down how to view pre op video . Discussed sternal precautions, IS and importance of mobility after surgery. Gave IS and pt demonstrated 1500 ml correctly. Family will be available 24/7 after discharge to help care for pt. Luetta NuttingCharlene Aaryn Sermon RN BSN 01/09/2018 3:22 PM

## 2018-01-09 NOTE — Progress Notes (Addendum)
ANTICOAGULATION CONSULT NOTE  Pharmacy Consult for Heparin Indication: chest pain/ACS  No Known Allergies  Patient Measurements: Height: 5\' 8"  (172.7 cm) Weight: 224 lb (101.6 kg) IBW/kg (Calculated) : 68.4 Heparin Dosing Weight: 87.1 kg  Vital Signs: Temp: 97.8 F (36.6 C) (07/23 0843) Temp Source: Oral (07/23 0843) BP: 156/87 (07/23 1115) Pulse Rate: 67 (07/23 1115)  Labs: Recent Labs    01/08/18 0842 01/08/18 1302 01/08/18 1645 01/09/18 0005 01/09/18 0510  HGB 16.0  --   --   --  15.8  HCT 44.9  --   --   --  44.9  PLT 239  --   --   --  245  APTT 27  --   --   --   --   LABPROT 12.5  --   --   --   --   INR 0.94  --   --   --   --   HEPARINUNFRC  --   --  0.19* 0.42 0.44  CREATININE 0.93  --   --   --  0.83  TROPONINI 0.81* 0.75* 0.76*  --  0.43*    Estimated Creatinine Clearance: 125.8 mL/min (by C-G formula based on SCr of 0.83 mg/dL).   Medical History: Past Medical History:  Diagnosis Date  . Alcohol abuse   . Hypertension    a. Untreated - had lost wt @ one point and BP improved.  . Tobacco abuse     Medications:  . sodium chloride    . heparin      Assessment: 47 yo male admitted with chest pain now s/p cath revealing three-vessel disease.   Goal of Therapy:  Heparin level 0.2-0.5 units/ml Monitor platelets by anticoagulation protocol: Yes   Plan:  Will resume heparin infusion 2 hours post TR band deflation without bolus at 1400 units/hr. Will check a HL 6 hours after resuming infusion.   Luisa HartScott Kennard Fildes, PharmD 01/09/2018,11:18 AM

## 2018-01-09 NOTE — Progress Notes (Signed)
CRITICAL CARE NOTE  CC  follow up NSTEMI  SUBJECTIVE  47 year old male who presented to the ED on 7/22 for chest pain with associated nausea, diaphoresis and SOB. Patient was noted to have elevated troponin in the emergency department and an elevated blood pressure. Patient started on nitro drip and admitted to the step down unit. LHC is planned for this morning. Patient states that his headache, tinnitus, and chest pain are still present, however are much improved.   SIGNIFICANT EVENTS No significant event overnight. Nitro drip stopped by Dr. Kirke CorinArida due to severe headache   BP (!) 150/99   Pulse 66   Temp 98.2 F (36.8 C) (Oral)   Resp 15   Ht 5\' 8"  (1.727 m)   Wt 101.7 kg (224 lb 3.3 oz)   SpO2 94%   BMI 34.09 kg/m    REVIEW OF SYSTEMS  GENERAL: -fever, -fatigue, -chills, -diaphoresis HEENT: +headache, +tinnitus, -ear pain, +congestion, +sore throat NECK: -neck pain, -neck stiffness PULMONARY: +productive cough (green sputum), -SOB, -hemoptysis, -wheezing CARDIOVASCULAR: +chest pain, -palpitations, -orthopnea, -claudication, -leg swelling, -PND GASTROINTESTINAL: -nausea, -vomiting, -diarrhea, -constipation, -abdominal pain, -heartburn GENITOURINARY: -dysuria, -frequency, -urgency SKIN: -rash, -itching NEURO: -dizziness, -focal weakness, -headaches    PHYSICAL EXAMINATION:  GENERAL:critically ill appearing, +resp distress HEAD: Normocephalic, atraumatic.  EYES: Pupils equal, round, reactive to light.  No scleral icterus. EOMI  MOUTH: Moist mucosal membrane. NECK: Supple. No thyromegaly. No nodules. No JVD.  PULMONARY: Clear to auscultation bilaterally. No wheezes, rales, or rhonchi CARDIOVASCULAR: S1 and S2. Regular rate and rhythm. No murmurs, rubs, or gallops. 2+ radial and dorsalis pedis pulses GASTROINTESTINAL: Soft, nontender, -distended. No masses. Positive bowel sounds. No hepatosplenomegaly.  MUSCULOSKELETAL: No swelling, clubbing, or edema.  NEUROLOGIC:  alert and oriented x 3, cranial nerves II-XII grossly intact, strength 5/5 in all limbs SKIN:intact,warm,dry  ASSESSMENT AND PLAN SYNOPSIS  47 year old male who presented to the ED with chest pain on 7/22. Patient noted to have elevated troponin. CXR without active cardiopulmonary disease. Cardiology will proceed with LHC this morning. Overall, patient feeling better. However, still c/o slight headache and chest pain.   CARDIAC: NSTEMI and hypertension -LHC this morning per cardiology/Dr. Kirke CorinArida -Heparin and aspirin for NSTEMI -Carvedilol, Amlodipine, Losartan for HTN -Atorvastatin started -troponin continuing to trend down 0.81-->0.75-->0.76-->0.43  PULMONARY: -productive cough secondary to tobacco abuse. No active cardiopulmonary disease on CXR 7/22. Continue solumedrol, duoneb, and pulmicort. Reactive airway disease vs. COPD vs. Bronchitis -azithromycin day 2  Renal: -no concerns -creatinine 0.93-->0.83 -UDS negative    NEUROLOGY -no concerns  ENDOCRINE: -HbA1c: 5.7%, meets pre-diabetic criteria -blood glucose 161 this morning; monitor with steroid use   ID -azithromycin per hospitalist    DVT: heparin  TRANSFUSIONS AS NEEDED MONITOR FSBS ASSESS the need for LABS as needed   Critical Care Time devoted to patient care services described in this note is 25 minutes.   Overall, patient is critically ill, prognosis is guarded.  Patient with Multiorgan failure and at high risk for cardiac arrest and death.

## 2018-01-09 NOTE — Progress Notes (Addendum)
ANTICOAGULATION CONSULT NOTE - Initial Consult  Pharmacy Consult for Heparin Indication: chest pain/ACS  No Known Allergies  Patient Measurements: Height: 5\' 8"  (172.7 cm) Weight: 224 lb 3.3 oz (101.7 kg) IBW/kg (Calculated) : 68.4 Heparin Dosing Weight: 87.1 kg  Vital Signs: Temp: 98.4 F (36.9 C) (07/22 1945) Temp Source: Oral (07/22 1945) BP: 167/100 (07/22 2230) Pulse Rate: 77 (07/22 2230)  Labs: Recent Labs    01/08/18 0842 01/08/18 1302 01/08/18 1645 01/09/18 0005  HGB 16.0  --   --   --   HCT 44.9  --   --   --   PLT 239  --   --   --   APTT 27  --   --   --   LABPROT 12.5  --   --   --   INR 0.94  --   --   --   HEPARINUNFRC  --   --  0.19* 0.42  CREATININE 0.93  --   --   --   TROPONINI 0.81* 0.75* 0.76*  --     Estimated Creatinine Clearance: 112.3 mL/min (by C-G formula based on SCr of 0.93 mg/dL).   Medical History: Past Medical History:  Diagnosis Date  . Alcohol abuse   . Hypertension    a. Untreated - had lost wt @ one point and BP improved.  . Tobacco abuse     Medications:  . sodium chloride    . sodium chloride    . heparin 1,400 Units/hr (01/08/18 2247)    Assessment: 47 yo male admitted with chest pain  Goal of Therapy:  Heparin level 0.3-0.7 units/ml Monitor platelets by anticoagulation protocol: Yes   Plan:  Give 4000 units bolus x 1 Start heparin infusion at 1100 units/hr Check anti-Xa level in 6 hours and daily while on heparin Continue to monitor H&H and platelets  01/08/18 16:45 HL subtherapeutic x 1. 2600 units IV x 1 bolus and increase rate to 1400 units/hr. Recheck HL in 6 hours.  7/23 0000 heparin level 0.42. Continue current regimen. Recheck with AM labs to confirm.  7/23 AM AM heparin level 0.44. Continue current regimen. Recheck heparin level and CBC with tomorrow AM labs.   Nathan A. Hiltonookson, VermontPharm.D., BCPS 01/09/2018,12:39 AM

## 2018-01-09 NOTE — Progress Notes (Addendum)
Report called to [facility] at [facility].  Report also given to [facility], who arrived to pick patient up for transport to [facility] at 1230.  Pt and family agreeable to transfer for CABG evaluation.  TR Band oozing slightly, 3 ccs air given to reinflate to 8 ccs.  [facility] aware of new deflation schedule.  Heparin gtt to restart 2 hours post band removal, [facility]u aware.  Patient's BP under control at time of transfer and he was calm and accepting of diagnosis.  Deflation syringe for TR Band sent with [facility] in a plastic band

## 2018-01-09 NOTE — Progress Notes (Signed)
ANTICOAGULATION CONSULT NOTE - Follow-Up Consult  Pharmacy Consult for heparin Indication: chest pain/ACS  No Known Allergies  Patient Measurements:   Heparin Dosing Weight: 87kg  Vital Signs: Temp: 97.6 F (36.4 C) (07/23 1130) Temp Source: Oral (07/23 1130) BP: 135/66 (07/23 1415) Pulse Rate: 53 (07/23 1415)  Labs: Recent Labs    01/08/18 0842 01/08/18 1302 01/08/18 1645 01/09/18 0005 01/09/18 0510  HGB 16.0  --   --   --  15.8  HCT 44.9  --   --   --  44.9  PLT 239  --   --   --  245  APTT 27  --   --   --   --   LABPROT 12.5  --   --   --   --   INR 0.94  --   --   --   --   HEPARINUNFRC  --   --  0.19* 0.42 0.44  CREATININE 0.93  --   --   --  0.83  TROPONINI 0.81* 0.75* 0.76*  --  0.43*    Estimated Creatinine Clearance: 125.8 mL/min (by C-G formula based on SCr of 0.83 mg/dL).   Medical History: Past Medical History:  Diagnosis Date  . Alcohol abuse   . Hypertension    a. Untreated - had lost wt @ one point and BP improved.  . Tobacco abuse      Assessment: [age] yoM with CP now s/p LHC with 3vCAD. Pt to resume IV heparin 8hr after sheath removal (~1030 per log). Pt previously therapeutic on 1400 units/hr.  Goal of Therapy:  Heparin level 0.3-0.7 units/ml Monitor platelets by anticoagulation protocol: Yes   Plan:  -Resume IV heparin 1400 units/hr with no bolus at 1830 -Check 6hr heparin level -F/U TCTS recs  Fredonia HighlandMichael Jovaun Levene, PharmD, BCPS Clinical Pharmacist [phone] Please check AMION for all [facility] numbers 01/09/2018

## 2018-01-09 NOTE — Discharge Summary (Signed)
[employer] - [place] at [facility]   PATIENT NAME: Edward Powell    MR#:  [id number]  DATE OF BIRTH:  October 17, 1970  DATE OF ADMISSION:  01/08/2018 ADMITTING PHYSICIAN: Alford Highland, MD  DATE OF DISCHARGE: 01/09/2018  PRIMARY CARE PHYSICIAN: Patient, No Pcp Per    ADMISSION DIAGNOSIS:  NSTEMI (non-ST elevated myocardial infarction) (HCC) [I21.4] Asthmatic bronchitis [J45.909]  DISCHARGE DIAGNOSIS:  Active Problems:   Asthmatic bronchitis   NSTEMI (non-ST elevated myocardial infarction) (HCC)   SECONDARY DIAGNOSIS:   Past Medical History:  Diagnosis Date  . Alcohol abuse   . Hypertension    a. Untreated - had lost wt @ one point and BP improved.  . Tobacco abuse     HOSPITAL COURSE:   1. NSTEMI.  The patient was admitted to the hospital with chest pain and shortness of breath.  Found to have a borderline troponin at 0.8.  Next troponin was 0.76 and third troponin 0 0.75.  The patient was initially started on heparin drip and given aspirin and started on beta-blocker.  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  Recommendation was to transfer to [facility] for CABG.  Patient started on Lipitor. 2.  Accelerated hypertension.  Patient was started on beta-blocker and Norvasc.  Continue to monitor.  Initially started on nitro drip yesterday. 3.  Asthmatic bronchitis could be secondary to underlying NSTEMI.  I gave a dose of steroid yesterday and nebulizer treatments.  Breathing better today. 4.  Hyperlipidemia unspecified LDL 207.  Started on high-dose Lipitor 5.  Alcohol abuse.  Drinks 2-3 times per week 8-9 shots at a time.  Advised to cut out alcohol 6.  Smoker.  Only smokes 1 to 2 cigarettes a day.  This needs to stop. 7.  Impaired fasting glucose.  Hemoglobin A1c 5.7.  Patient is not a diabetic at this time  DISCHARGE CONDITIONS:   Fair  CONSULTS OBTAINED:  Treatment Team:  Iran Ouch,  MD Merwyn Katos, MD  DRUG ALLERGIES:  No Known Allergies  DISCHARGE MEDICATIONS:   Allergies as of 01/09/2018   No Known Allergies     Medication List    TAKE these medications   acetaminophen 325 MG tablet Commonly known as:  TYLENOL Take 2 tablets (650 mg total) by mouth every 6 (six) hours as needed for mild pain (or Fever >/= 101).   amLODipine 5 MG tablet Commonly known as:  NORVASC Take 1 tablet (5 mg total) by mouth daily.   aspirin 81 MG EC tablet Take 1 tablet (81 mg total) by mouth daily.   atorvastatin 80 MG tablet Commonly known as:  LIPITOR Take 1 tablet (80 mg total) by mouth daily at 6 PM.   azithromycin 250 MG tablet Commonly known as:  ZITHROMAX One tab daily for three days   carvedilol 12.5 MG tablet Commonly known as:  COREG Take 1 tablet (12.5 mg total) by mouth 2 (two) times daily with a meal.   heparin 100-0.45 UNIT/ML-% infusion Inject 1,400 Units/hr into the vein continuous.   ipratropium-albuterol 0.5-2.5 (3) MG/3ML Soln Commonly known as:  DUONEB Take 3 mLs by nebulization every 6 (six) hours.   losartan 25 MG tablet Commonly known as:  COZAAR Take 1 tablet (25 mg total) by mouth daily.   morphine 2 MG/ML injection Inject 1 mL (2 mg total) into the vein every 4 (four) hours as needed.   nitroGLYCERIN 0.4 MG  SL tablet Commonly known as:  NITROSTAT Place 1 tablet (0.4 mg total) under the tongue every 5 (five) minutes as needed for chest pain.   ondansetron 4 MG tablet Commonly known as:  ZOFRAN Take 1 tablet (4 mg total) by mouth every 6 (six) hours as needed for nausea.        DISCHARGE INSTRUCTIONS:    Follow[facility]oracic [facility].  If you experience worsening of your admission symptoms, develop shortness of breath, life threatening emergency, suicidal or homicidal thoughts you must seek medical attention immediately by calling 911 or calling your MD immediately  if  symptoms less severe.  You Must read complete instructions/literature along with all the possible adverse reactions/side effects for all the Medicines you take and that have been prescribed to you. Take any new Medicines after you have completely understood and accept all the possible adverse reactions/side effects.   Please note  You were cared for by a hospitalist during your hospital stay. If you have any questions about your discharge medications or the care you received while you were in the hospital after you are discharged, you can call the unit and asked to speak with the hospitalist on call if the hospitalist that took care of you is not available. Once you are discharged, your primary care physician will handle any further medical issues. Please note that NO REFILLS for any discharge medications will be authorized once you are discharged, as it is imperative that you return to your primary care physician (or establish a relationship with a primary care physician if you do not have one) for your aftercare needs so that they can reassess your need for medications and monitor your lab values.    Today   CHIEF COMPLAINT:   Chief Complaint  Patient presents with  . Chest Pain    HISTORY OF PRESENT ILLNESS:  Edward JubaDarin Powell  is a 47 y.o. male  presented with chest pain and shortness of breath   VITAL SIGNS:  Blood pressure (!) 156/87, pulse 67, temperature 97.8 F (36.6 C), temperature source Oral, resp. rate 13, height 5\' 8"  (1.727 m), weight 101.6 kg (224 lb), SpO2 95 %.  I/O:    Intake/Output Summary (Last 24 hours) at 01/09/2018 1134 Last data filed at 01/09/2018 1100 Gross per 24 hour  Intake 449.1 ml  Output 2075 ml  Net -1625.9 ml    PHYSICAL EXAMINATION:  GENERAL:  47 y.o.-year-old patient lying in the bed with no acute distress.  EYES: Pupils equal, round, reactive to light and accommodation. No scleral icterus. Extraocular muscles intact.  HEENT: Head atraumatic,  normocephalic. Oropharynx and nasopharynx clear.  NECK:  Supple, no jugular venous distention. No thyroid enlargement, no tenderness.  LUNGS: Normal breath sounds bilaterally, no wheezing, rales,rhonchi or crepitation. No use of accessory muscles of respiration.  CARDIOVASCULAR: S1, S2 normal. No murmurs, rubs, or gallops.  ABDOMEN: Soft, non-tender, non-distended. Bowel sounds present. No organomegaly or mass.  EXTREMITIES: No pedal edema, cyanosis, or clubbing.  NEUROLOGIC: Cranial nerves II through XII are intact. Muscle strength 5/5 in all extremities. Sensation intact. Gait not checked.  PSYCHIATRIC: The patient is alert and oriented x 3.  SKIN: No obvious rash, lesion, or ulcer.   DATA REVIEW:   CBC Recent Labs  Lab 01/09/18 0510  WBC 9.6  HGB 15.8  HCT 44.9  PLT 245    Chemistries  Recent Labs  Lab 01/09/18 0510  NA 137  K 4.4  CL 103  CO2 24  GLUCOSE 161*  BUN 15  CREATININE 0.83  CALCIUM 9.5    Cardiac Enzymes Recent Labs  Lab 01/09/18 0510  TROPONINI 0.43*    Microbiology Results  Results for orders placed or performed during the hospital encounter of 01/08/18  MRSA PCR Screening     Status: None   Collection Time: 01/08/18  2:24 PM  Result Value Ref Range Status   MRSA by PCR NEGATIVE NEGATIVE Final    Comment:        The GeneXpert MRSA Assay (FDA approved for NASAL specimens only), is one component of a comprehensive MRSA colonization surveillance program. It is not intended to diagnose MRSA infection nor to guide or monitor treatment for MRSA infections. Performed at [facility], [address], [place], [place] [address]     RADIOLOGY:  Dg Chest 2 View  Result Date: 01/08/2018 CLINICAL DATA:  Mid chest pain. EXAM: CHEST - 2 VIEW COMPARISON:  Chest CT 02/18/2012 FINDINGS: Monitoring leads overlie the patient. Mild cardiomegaly. No consolidative pulmonary opacities. Elevation right hemidiaphragm. No pleural effusion or  pneumothorax. Regional skeleton unremarkable. IMPRESSION: No acute cardiopulmonary process. Electronically Signed   By: Annia Belt M.D.   On: 01/08/2018 09:12      Management plans discussed with the patient,  and cardiology and they are in agreement.  CODE STATUS:     Code Status Orders  (From admission, onward)        Start     Ordered   01/08/18 1108  Full code  Continuous     01/08/18 1108    Code Status History    This patient has a current code status but no historical code status.      TOTAL TIME TAKING CARE OF THIS PATIENT: 35 minutes.    Alford Highland M.D on 01/09/2018 at 11:34 AM  Between 7am to 6pm - Pager - [phone]  After 6pm go to [email or web address] - password [email or web address] [facility]  [employer] Office  [phone]  CC: Primary care physician; Patient, No Pcp Per

## 2018-01-09 NOTE — Progress Notes (Signed)
  Echocardiogram 2D Echocardiogram has been performed.  Celene SkeenVijay  Meyli Boice 01/09/2018, 4:09 PM

## 2018-01-09 NOTE — Progress Notes (Signed)
Pre-op Cardiac Surgery  Carotid Findings:  Findings suggest 1-39% internal carotid artery stenosis bilaterally. Vertebral arteries are patent with antegrade flow.   Unable to complete right Allen's test due to presence of TR band. Left Allen's test- signal decreased 50% with both radial and ulnar compression.  Lower  Extremity Right Left  Dorsalis Pedis Triphasic Triphasic  Posterior Tibial Triphasic Triphasic     Edward GreenlandMegan Riddle, [name], BS, RVT, RDCS, RDMS 01/09/2018 7:02 PM

## 2018-01-09 NOTE — Progress Notes (Signed)
Progress Note  Patient Name: Edward SpeckingDarin W Petti Date of Encounter: 01/09/2018  Primary Cardiologist: Lorine BearsMuhammad Arida, MD   Subjective   No chest pain though he still has vague unwell feeling.  No shortness of breath.  Cough improved.  Inpatient Medications    Scheduled Meds: . [MAR Hold] amLODipine  5 mg Oral Daily  . [MAR Hold] aspirin EC  81 mg Oral Daily  . [MAR Hold] atorvastatin  80 mg Oral q1800  . [MAR Hold] azithromycin  250 mg Oral Daily  . [MAR Hold] budesonide (PULMICORT) nebulizer solution  0.5 mg Nebulization BID  . [MAR Hold] carvedilol  12.5 mg Oral BID WC  . hydrALAZINE      . hydrALAZINE  10 mg Intravenous Once  . [MAR Hold] ipratropium-albuterol  3 mL Nebulization Q6H  . [MAR Hold] losartan  25 mg Oral Daily  . sodium chloride flush  3 mL Intravenous Q12H   Continuous Infusions: . sodium chloride    . heparin     PRN Meds: sodium chloride, [MAR Hold] acetaminophen **OR** [MAR Hold] acetaminophen, [MAR Hold]  morphine injection, [MAR Hold] nitroGLYCERIN, [MAR Hold] ondansetron **OR** [MAR Hold] ondansetron (ZOFRAN) IV, [MAR Hold] oxyCODONE, sodium chloride flush   Vital Signs    Vitals:   01/09/18 0800 01/09/18 0804 01/09/18 0843 01/09/18 1045  BP: (!) 150/99 (!) 150/99 (!) 170/115 (!) 165/105  Pulse: 62 66 74 67  Resp: 13  20 13   Temp: 97.7 F (36.5 C)  97.8 F (36.6 C)   TempSrc: Oral  Oral   SpO2: 95%  96% 94%  Weight:   224 lb (101.6 kg)   Height:   5\' 8"  (1.727 m)     Intake/Output Summary (Last 24 hours) at 01/09/2018 1107 Last data filed at 01/09/2018 1100 Gross per 24 hour  Intake 449.1 ml  Output 1775 ml  Net -1325.9 ml   Filed Weights   01/08/18 1400 01/08/18 2130 01/09/18 0843  Weight: 223 lb 5.2 oz (101.3 kg) 224 lb 3.3 oz (101.7 kg) 224 lb (101.6 kg)    Telemetry    Normal sinus rhythm. - Personally Reviewed  ECG    No new tracing  Physical Exam   GEN: No acute distress.   Neck: No JVD Cardiac: RRR, no murmurs, rubs, or  gallops.  Respiratory: Clear to auscultation bilaterally. GI: Soft, nontender, non-distended  MS: No edema; No deformity. Neuro:  Nonfocal  Psych: Normal affect   Labs    Chemistry Recent Labs  Lab 01/08/18 0842 01/09/18 0510  NA 141 137  K 3.5 4.4  CL 107 103  CO2 23 24  GLUCOSE 155* 161*  BUN 14 15  CREATININE 0.93 0.83  CALCIUM 8.5* 9.5  GFRNONAA >60 >60  GFRAA >60 >60  ANIONGAP 11 10     Hematology Recent Labs  Lab 01/08/18 0842 01/09/18 0510  WBC 5.9 9.6  RBC 4.82 4.79  HGB 16.0 15.8  HCT 44.9 44.9  MCV 93.2 93.8  MCH 33.1 33.1  MCHC 35.6 35.2  RDW 13.4 13.6  PLT 239 245    Cardiac Enzymes Recent Labs  Lab 01/08/18 0842 01/08/18 1302 01/08/18 1645 01/09/18 0510  TROPONINI 0.81* 0.75* 0.76* 0.43*   No results for input(s): TROPIPOC in the last 168 hours.   BNPNo results for input(s): BNP, PROBNP in the last 168 hours.   DDimer No results for input(s): DDIMER in the last 168 hours.   Radiology    Dg Chest 2 View  Result Date: 01/08/2018 CLINICAL DATA:  Mid chest pain. EXAM: CHEST - 2 VIEW COMPARISON:  Chest CT 02/18/2012 FINDINGS: Monitoring leads overlie the patient. Mild cardiomegaly. No consolidative pulmonary opacities. Elevation right hemidiaphragm. No pleural effusion or pneumothorax. Regional skeleton unremarkable. IMPRESSION: No acute cardiopulmonary process. Electronically Signed   By: Annia Belt M.D.   On: 01/08/2018 09:12    Cardiac Studies   LHC (01/09/2018): 1. Severe three-vessel coronary artery disease, as detailed below. 2. Normal left ventricular systolic function with moderately to severely elevated left ventricular filling pressure.  Diagnostic Diagram        Patient Profile     47 y.o. male man with history of untreated hypertension, admitted with chest pain in the setting of coughing and elevated troponin consistent with NSTEMI.  Assessment & Plan    NSTEMI Pain improved, though Mr. Edward Powell continues to have  vague and easy sensation.  He was intolerant of nitroglycerin due to headache.  Catheterization today revealed severe three-vessel disease and moderate to severely elevated LVEDP with preserved LVEF.  Transfer to [facility] for cardiac surgery consultation for CABG.  Restart heparin infusion 2 hours after TR band removal.  Continue carvedilol 12.5 mg twice daily, with up titration as heart rate allows for antianginal and blood pressure therapy.  If continued angina, will need to consider starting nitroglycerin infusion in managing headache with analgesic medications.  Aggressive secondary prevention.  Acute diastolic heart failure LVEDP moderately to severely elevated, likely contributing to continued vague chest discomfort.  Furosemide 40 mg IV x1 given in the Cath Lab.  Further diuresis, as tolerated.  Hypertension Blood pressure still suboptimally controlled.  Continue carvedilol 12.5 mg twice daily.  Start amlodipine 5 mg daily.  PRN labetalol and hydralazine.  Hyperlipidemia LDL severely elevated.  Goal is less than 70 given severe three-vessel CAD.  Continue atorvastatin 80 mg daily.  Tobacco use  Smoking cessation encouraged.  For questions or updates, please contact [facility] Please consult [email or web address] for contact info under [facility]    Signed, Yvonne Kendall, MD  01/09/2018, 11:07 AM

## 2018-01-09 NOTE — Progress Notes (Signed)
Pre-op Cardiac Surgery  Carotid Findings:  ICA 1-39% bilaterally.      Lower  Extremity Right Left  Dorsalis Pedis Triphasic Triphasic  Anterior Tibial    Posterior Tibial Triphasic Triphasic   Ankle/Brachial Indices     Blanch MediaMegan Chaska Hagger 01/09/2018 4:52 PM

## 2018-01-09 NOTE — Consult Note (Signed)
[address]       [place] [address]             [phone]        LINDA BIEHN [facility] Medical Record #[id number] Date of Birth: [birth year]  Referring: Dr. Okey Dupre Primary Care: Patient, No Pcp Per Primary Cardiologist:Muhammad Kirke Corin, MD  Chief Complaint:   Chest Pain, SOB, + CAD  History of Present Illness:      Mr. Broadfoot is a 48 yo male with known history of HTN, Alcohol abuse (20+ oz of liquor per week) and nicotine abuse.  He presented to the ED with complaints of chest pain.  He described the pain as his chest being on fire.  This was associated with shortness of breath, nausea, diaphoresis, and light-headedness.  He admitted he has been feeling poorly over the past month and has been coughing up some yellow sputum.  He was told he may have had pneumonia and was treated with antibiotics.  Workup in the ED showed his troponin level to be 0.8.  He was treated with ASA and placed on a Heparin and NTG drip.  EKG did not exhibit significant changes.  He was hypertensive with SBP in the 190s.  He was admitted for further care and Cardiology consult was requested.  He was evaluated by Dr. Kirke Corin who ruled patient in for NSTEMI.  It was felt cardiac catheterization would be indicated and the patient was agreeable to proceed.  Catheterization was performed today by Dr. Okey Dupre and showed severe multivessel CAD.  It was felt coronary bypass grafting would be indicated and the patient was transferred to [facility] for further care.  Currently the patient is chest pain free.  He also denies shortness of breath.  He continues to cough but states that this has been improving.  He denies fevers, chills.  He admits to smoking 1 ppd per week.  He also admits to heavy drinking.  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  He is nervous about having surgery.     Current Activity/ Functional Status: Patient was  independent with mobility/ambulation, transfers, ADL's, IADL's.   Zubrod Score: At the time of surgery this patient's most appropriate activity status/level should be described as: []     0    Normal activity, no symptoms [x]     1    Restricted in physical strenuous activity but ambulatory, able to do out light work []     2    Ambulatory and capable of self care, unable to do work activities, up and about                 more than 50%  Of the time                            []     3    Only limited self care, in bed greater than 50% of waking hours []     4    Completely disabled, no self care, confined to bed or chair []     5    Moribund  Past Medical History:  Diagnosis Date  . Alcohol abuse   . Hypertension    a. Untreated - had lost wt @ one point and BP improved.  . Tobacco abuse     Past Surgical History:  Procedure Laterality  Date  . NO PAST SURGERIES      Social History   Tobacco Use  Smoking Status Current Every Day Smoker  Smokeless Tobacco Never Used  Tobacco Comment   Was smoking a ppd until about 3 mos ago. Now smoking a pack/wk.    Social History   Substance and Sexual Activity  Alcohol Use Yes  . Alcohol/week: 9.0 - 12.0 oz  . Types: 15 - 20 Shots of liquor per week   Comment: up to 7-9 shots twice weekly - vodka   No Known Allergies  No current facility-administered medications for this encounter.     Medications Prior to Admission  Medication Sig Dispense Refill Last Dose  . acetaminophen (TYLENOL) 325 MG tablet Take 2 tablets (650 mg total) by mouth every 6 (six) hours as needed for mild pain (or Fever >/= 101).     Marland Kitchen amLODipine (NORVASC) 5 MG tablet Take 1 tablet (5 mg total) by mouth daily.     Marland Kitchen aspirin EC 81 MG EC tablet Take 1 tablet (81 mg total) by mouth daily.     Marland Kitchen atorvastatin (LIPITOR) 80 MG tablet Take 1 tablet (80 mg total) by mouth daily at 6 PM.     . azithromycin (ZITHROMAX) 250 MG tablet One tab daily for three days 6 each 0   .  carvedilol (COREG) 12.5 MG tablet Take 1 tablet (12.5 mg total) by mouth 2 (two) times daily with a meal.     . heparin 100-0.45 UNIT/ML-% infusion Inject 1,400 Units/hr into the vein continuous. 250 mL    . ipratropium-albuterol (DUONEB) 0.5-2.5 (3) MG/3ML SOLN Take 3 mLs by nebulization every 6 (six) hours. 360 mL    . losartan (COZAAR) 25 MG tablet Take 1 tablet (25 mg total) by mouth daily.     Marland Kitchen morphine 2 MG/ML injection Inject 1 mL (2 mg total) into the vein every 4 (four) hours as needed. 1 mL 0   . nitroGLYCERIN (NITROSTAT) 0.4 MG SL tablet Place 1 tablet (0.4 mg total) under the tongue every 5 (five) minutes as needed for chest pain.  12   . ondansetron (ZOFRAN) 4 MG tablet Take 1 tablet (4 mg total) by mouth every 6 (six) hours as needed for nausea. 20 tablet 0     Family History  Problem Relation Age of Onset  . Hypertension Mother   . Breast cancer Mother   . Liver cancer Father   . Lung cancer Father   . Hypertension Father   . Atrial fibrillation Father    Review of Systems:   ROS Constitutional: negative Respiratory: positive for cough Cardiovascular: positive for chest pain and on admission, now resolved Gastrointestinal: negative for abdominal pain, nausea and vomiting Neurological: negative for coordination problems, weakness and no previous stroke     Cardiac Review of Systems: Y or  [    ]= no  Chest Pain [ Y   ]  Resting SOB [   ] Exertional SOB  [Y  ]  Orthopnea [  ]   Pedal Edema [   ]    Palpitations [  ] Syncope  [  ]   Presyncope [   ]  General Review of Systems: [Y] = yes [  ]=no Constitional: recent weight change Klaus.Mock  ]; anorexia [  ]; fatigue [  ]; nausea Klaus.Mock ]; night sweats [  ]; fever [ N ]; or chills [ N ]  Dental: Last Dentist visit:   Eye : blurred vision [  ]; diplopia [   ]; vision changes [  ];  Amaurosis fugax[  ]; Resp: cough [Y  ];  wheezing[  ];  hemoptysis[  ]; shortness of breath[Y   ]; paroxysmal nocturnal dyspnea[  ]; dyspnea on exertion[  ]; or orthopnea[  ];  GI:  gallstones[  ], vomiting[  ];  dysphagia[  ]; melena[  ];  hematochezia [  ]; heartburn[  ];   Hx of  Colonoscopy[  ]; GU: kidney stones [  ]; hematuria[  ];   dysuria [  ];  nocturia[  ];  history of     obstruction [  ]; urinary frequency [  ]             Skin: rash, swelling[  ];, hair loss[  ];  peripheral edema[ N ];  or itching[  ]; Musculosketetal: myalgias[  ];  joint swelling[  ];  joint erythema[  ];  joint pain[  ];  back pain[  ];  Heme/Lymph: bruising[  ];  bleeding[  ];  anemia[  ];  Neuro: TIA[  ];  headaches[  ];  stroke[  ];  vertigo[  ];  seizures[  ];   paresthesias[  ];  difficulty walking[  ];  Psych:depression[  ]; anxiety[  ];  Endocrine: diabetes[ N ];  thyroid dysfunction[N  ];           Physical Exam:  BP 135/66   Pulse (!) 53   Resp (!) 7   SpO2 93%   General appearance: alert, cooperative and no distress Head: Normocephalic, without obvious abnormality, atraumatic Neck: no adenopathy, no carotid bruit, no JVD, supple, symmetrical, trachea midline and thyroid not enlarged, symmetric, no tenderness/mass/nodules Resp: clear to auscultation bilaterally Cardio: regular rate and rhythm GI: soft, non-tender; bowel sounds normal; no masses,  no organomegaly Extremities: extremities normal, atraumatic, no cyanosis or edema and + palpable pulses Neurologic: Grossly normal  Diagnostic Studies & Laboratory data:   Catheterization:  Left Main  Vessel is large.  Dist LM lesion 50% stenosed  Dist LM lesion is 50% stenosed. The lesion is eccentric.  Left Anterior Descending  Vessel is large.  Ost LAD lesion 60% stenosed  Ost LAD lesion is 60% stenosed. The lesion is eccentric.  Prox LAD to Mid LAD lesion 80% stenosed  Prox LAD to Mid LAD lesion is 80% stenosed. Focal napkin ring lesion at at takeoff of first major septal branch.  First Diagonal Branch  Vessel is small in  size.  Second Diagonal Branch  Vessel is small in size.  Third Diagonal Branch  Vessel is small in size.  Ramus Intermedius  Vessel is moderate in size. Vessel is angiographically normal.  Left Circumflex  Vessel is moderate in size.  Prox Cx lesion 100% stenosed  Prox Cx lesion is 100% stenosed.  Second Obtuse Marginal Branch  Vessel is small in size.  Collaterals  2nd Mrg filled by collaterals from Dist LAD.    Right Coronary Artery  Vessel is moderate in size.  Prox RCA lesion 70% stenosed  Prox RCA lesion is 70% stenosed.  Mid RCA-1 lesion 40% stenosed  Mid RCA-1 lesion is 40% stenosed.  Mid RCA-2 lesion 90% stenosed  Mid RCA-2 lesion is 90% stenosed.  Dist RCA lesion 60% stenosed  Dist RCA lesion is 60% stenosed.      Recent Radiology Findings:   Dg Chest 2 View  Result  Date: 01/08/2018 CLINICAL DATA:  Mid chest pain. EXAM: CHEST - 2 VIEW COMPARISON:  Chest CT 02/18/2012 FINDINGS: Monitoring leads overlie the patient. Mild cardiomegaly. No consolidative pulmonary opacities. Elevation right hemidiaphragm. No pleural effusion or pneumothorax. Regional skeleton unremarkable. IMPRESSION: No acute cardiopulmonary process. Electronically Signed   By: Annia Belt M.D.   On: 01/08/2018 09:12     I have independently reviewed the above radiologic studies and discussed with the patient   Recent Lab Findings: Lab Results  Component Value Date   WBC 9.6 01/09/2018   HGB 15.8 01/09/2018   HCT 44.9 01/09/2018   PLT 245 01/09/2018   GLUCOSE 161 (H) 01/09/2018   CHOL 318 (H) 01/09/2018   TRIG 265 (H) 01/09/2018   HDL 58 01/09/2018   LDLCALC 207 (H) 01/09/2018   ALT 44 02/18/2012   AST 33 02/18/2012   NA 137 01/09/2018   K 4.4 01/09/2018   CL 103 01/09/2018   CREATININE 0.83 01/09/2018   BUN 15 01/09/2018   CO2 24 01/09/2018   INR 0.94 01/08/2018   HGBA1C 5.7 (H) 01/08/2018    Assessment / Plan:      1. CAD- severe multivessel CAD, currently chest pain free, likely  for CABG in AM  2. Alcohol abuse- may benefit from CIWA protocol 3. Nicotine abuse 4. HTN- patient was hypertensive in room, continue antihypertensive agents 5. Dispo- patient stable, chest pain free,  for CABG in Am.Denny Peon Barrett PA-C 01/09/2018 1:41 PM  Have reviewed with patient his history and examination reviewed his echocardiogram chest x-ray Doppler studies and cardiac catheterization.  With the patient's acute presentation and significant three-vessel coronary artery disease I agree with Dr. and coronary artery bypass grafting offers the best long-term solution for the patient risks and options were discussed with he and his family in detail.  The goals risks and alternatives of the planned surgical procedure coronary artery bypass graft have been discussed with the patient in detail. The risks of the procedure including death, infection, stroke, myocardial infarction, bleeding, blood transfusion have all been discussed specifically.  I have quoted Alferd W Bresnan a 2% of perioperative mortality and a complication rate as high as 40%. The patient's questions have been answered.Benno NAHOM CARFAGNO is willing  to proceed with the planned procedure.  Delight Ovens MD      [address] [place] [place] [address] Office [phone]   Beeper [phone]

## 2018-01-09 NOTE — H&P (Signed)
History & Physical    Patient ID: IZIC STFORT MRN: [id number], DOB/AGE: [birth year]   Admit date: 01/09/2018   Primary Physician: Patient, No Pcp Per Primary Cardiologist: Lorine Bears, MD  Patient Profile    Edward Powell is a 47 y.o. male with a history of tob abuse, etoh abuse, and untreated HTN who was admitted to [facility] with malignant HTN and nstemi, and found to have 3VD.  Past Medical History    Past Medical History:  Diagnosis Date  . Alcohol abuse   . Hypertension    a. Untreated - had lost wt @ one point and BP improved.  . Tobacco abuse     Past Surgical History:  Procedure Laterality Date  . NO PAST SURGERIES       Allergies  No Known Allergies  History of Present Illness    47 year old-year-old male without prior cardiac history.  He does have a history of hypertension, stating that he was diagnosed several years ago but then lost weight but has slowly but surely regained it.  He has not been to a doctor in many years.  He has smoked a pack a day for most of his adult life but over the past 2 to 3 months has been smoking a pack a week.  He drinks heavily about twice a week, doing multiple vodka shots per setting.  He was in his usual state of health until about 2 to 3 months ago, when he began to experience a productive cough associated with chest congestion and occasional dyspnea.  It was at that point that he reduced his smoking.  This  persisted without fevers or chills.  He had not previously sought medical attention for this.  On the evening of July 19, he went to [employer] with his wife and while in the store, he became very short of breath.  He walked out to the car and started having chest tightness and also became diaphoretic.  He sat in the car with air conditioning on and symptoms resolved within about 15 minutes.  He had no recurrent symptoms the remainder of Friday were on Saturday/Sunday.  This morning, he was sitting at his work desk and  began coughing violently.  He was also sneezing some.  In that setting, he had sudden onset of severe 10/10 substernal chest burning associated with dyspnea, nausea, and diaphoresis.  He says he was having significant trouble getting his breath and he presented to the emergency department for further evaluation.  In the ED, he was markedly hypertensive with a blood pressure of 204/133.  ECG notable for subtle inferolateral ST depression.  Initial troponin was 0.81.  Chest x-ray without acute cardiopulmonary process.  Patient was placed on heparin and also seen by internal medicine and placed on antibiotics and steroids.  Patient was not actively wheezing.  IV ntg added and admitted to ICU.  BP remained elevated though c/p improved.  Trop trend remained relatively flat w/ peak of 0.81.  He underwent cath this AM revealing an occluded LCX, sev mRCA dzs, and moderate LM (50) and LAD dzs (80).  He was tx to [facility] for further eval.  Home Medications    Prior to Admission medications   Medication Sig Start Date End Date Taking? Authorizing Provider  acetaminophen (TYLENOL) 325 MG tablet Take 2 tablets (650 mg total) by mouth every 6 (six) hours as needed for mild pain (or Fever >/= 101). 01/09/18   Alford Highland, MD  amLODipine (NORVASC) 5 MG tablet Take 1 tablet (5 mg total) by mouth daily. 01/09/18   Alford HighlandWieting, Richard, MD  aspirin EC 81 MG EC tablet Take 1 tablet (81 mg total) by mouth daily. 01/09/18   Alford HighlandWieting, Richard, MD  atorvastatin (LIPITOR) 80 MG tablet Take 1 tablet (80 mg total) by mouth daily at 6 PM. 01/09/18   Alford HighlandWieting, Richard, MD  azithromycin [facility](ZITHROMAX) 250 MG tablet One tab daily for three days 01/09/18   Alford HighlandWieting, Richard, MD  carvedilol (COREG) 12.5 MG tablet Take 1 tablet (12.5 mg total) by mouth 2 (two) times daily with a meal. 01/09/18   Alford HighlandWieting, Richard, MD  heparin 100-0.45 UNIT/ML-% infusion Inject 1,400 Units/hr into the vein continuous. 01/09/18   Alford HighlandWieting, Richard, MD    ipratropium-albuterol (DUONEB) 0.5-2.5 (3) MG/3ML SOLN Take 3 mLs by nebulization every 6 (six) hours. 01/09/18   Alford HighlandWieting, Richard, MD  losartan (COZAAR) 25 MG tablet Take 1 tablet (25 mg total) by mouth daily. 01/09/18   Alford HighlandWieting, Richard, MD  morphine 2 MG/ML injection Inject 1 mL (2 mg total) into the vein every 4 (four) hours as needed. 01/09/18   Alford HighlandWieting, Richard, MD  nitroGLYCERIN (NITROSTAT) 0.4 MG SL tablet Place 1 tablet (0.4 mg total) under the tongue every 5 (five) minutes as needed for chest pain. 01/09/18   Alford HighlandWieting, Richard, MD  ondansetron (ZOFRAN) 4 MG tablet Take 1 tablet (4 mg total) by mouth every 6 (six) hours as needed for nausea. 01/09/18   Alford HighlandWieting, Richard, MD    Family History    Family History  Problem Relation Age of Onset  . Hypertension Mother   . Breast cancer Mother   . Liver cancer Father   . Lung cancer Father   . Hypertension Father   . Atrial fibrillation Father    indicated that his mother is alive. He indicated that his father is alive.   Social History    Social History   Socioeconomic History  . Marital status: Married    Spouse name: Not on file  . Number of children: Not on file  . Years of education: Not on file  . Highest education level: Not on file  Occupational History  . Not on file  Social Needs  . Financial resource strain: Not on file  . Food insecurity:    Worry: Not on file    Inability: Not on file  . Transportation needs:    Medical: Not on file    Non-medical: Not on file  Tobacco Use  . Smoking status: Current Every Day Smoker  . Smokeless tobacco: Never Used  . Tobacco comment: Was smoking a ppd until about 3 mos ago. Now smoking a pack/wk.  Substance and Sexual Activity  . Alcohol use: Yes    Alcohol/week: 9.0 - 12.0 oz    Types: 15 - 20 Shots of liquor per week    Comment: up to 7-9 shots twice weekly - vodka  . Drug use: Not Currently  . Sexual activity: Yes  Lifestyle  . Physical activity:    Days per week:  Not on file    Minutes per session: Not on file  . Stress: Not on file  Relationships  . Social connections:    Talks on phone: Not on file    Gets together: Not on file    Attends religious service: Not on file    Active member of club or organization: Not on file    Attends meetings of clubs or organizations:  Not on file    Relationship status: Not on file  . Intimate partner violence:    Fear of current or ex partner: Not on file    Emotionally abused: Not on file    Physically abused: Not on file    Forced sexual activity: Not on file  Other Topics Concern  . Not on file  Social History Narrative   Lives in [place] with significant other.  No children.  Does not routinely exercise.     Review of Systems    General:  No chills, fever, night sweats or weight changes.  Cardiovascular:  +++ chest pain, +++ dyspnea on exertion, no edema, orthopnea, palpitations, paroxysmal nocturnal dyspnea. Dermatological: No rash, lesions/masses Respiratory: +++ cough, +++ dyspnea Urologic: No hematuria, dysuria Abdominal:  +++ nausea, no vomiting, diarrhea, bright red blood per rectum, melena, or hematemesis Neurologic:  No visual changes, wkns, changes in mental status. All other systems reviewed and are otherwise negative except as noted above.  Physical Exam    Blood pressure 135/66, pulse (!) 53, resp. rate (!) 7, SpO2 93 %.  General: Pleasant, NAD Psych: Normal affect. Neuro: Alert and oriented X 3. Moves all extremities spontaneously. HEENT: Normal  Neck: Supple without bruits or JVD. Lungs:  Resp regular and unlabored, CTA. Heart: RRR no s3, s4, or murmurs. Abdomen: Soft, non-tender, non-distended, BS + x 4.  Extremities: No clubbing, cyanosis or edema. DP/PT/Radials 2+ and equal bilaterally.  Labs     Recent Labs    01/08/18 0842 01/08/18 1302 01/08/18 1645 01/09/18 0510  TROPONINI 0.81* 0.75* 0.76* 0.43*   Lab Results  Component Value Date   WBC 9.6 01/09/2018    HGB 15.8 01/09/2018   HCT 44.9 01/09/2018   MCV 93.8 01/09/2018   PLT 245 01/09/2018    Recent Labs  Lab 01/09/18 0510  NA 137  K 4.4  CL 103  CO2 24  BUN 15  CREATININE 0.83  CALCIUM 9.5  GLUCOSE 161*   Lab Results  Component Value Date   CHOL 318 (H) 01/09/2018   HDL 58 01/09/2018   LDLCALC 207 (H) 01/09/2018   TRIG 265 (H) 01/09/2018     Radiology Studies    Dg Chest 2 View  Result Date: 01/08/2018 CLINICAL DATA:  Mid chest pain. EXAM: CHEST - 2 VIEW COMPARISON:  Chest CT 02/18/2012 FINDINGS: Monitoring leads overlie the patient. Mild cardiomegaly. No consolidative pulmonary opacities. Elevation right hemidiaphragm. No pleural effusion or pneumothorax. Regional skeleton unremarkable. IMPRESSION: No acute cardiopulmonary process. Electronically Signed   By: Annia Belt M.D.   On: 01/08/2018 09:12    ECG & Cardiac Imaging    RSR, 93, inflat ST dep - no old for comparison  Assessment & Plan    Non-STEMI/coronary artery disease: Status post presentation with chest pain and dyspnea in the setting of markedly elevated blood pressures.  Peak troponin of 0.81 and has been downtrending since admission.  Catheterization today revealed severe LAD, circumflex, and RCA disease.  Transferred to [facility] for further evaluation and CT surgical assessment.  LVEDP elevated at time of catheterization.  We will continue diuresis along with aspirin, statin, beta-blocker, ARB.  Resume heparin.  2.  Hypertensive urgency: Currently stable.  Continue current regimen.  Next  3.  Tobacco abuse: Cessation advised.  4.  Alcohol abuse: Binge drinks about twice a week.  Usually shots of vodka.  Cessation advised.  Signed, Nicolasa Ducking, NP 01/09/2018, 2:43 PM

## 2018-01-09 NOTE — Plan of Care (Signed)
Pt transported to cath lab for cardiac cath.  Pt understanding of procedure and post procedure activities verbalized.  Pt on room air, VSS, stands at bedside to void.

## 2018-01-10 ENCOUNTER — Encounter (HOSPITAL_COMMUNITY)
Admission: AD | Disposition: A | Discharge status: Home / Self Care | Payer: Self-pay | Source: Other Acute Inpatient Hospital | Attending: Cardiothoracic Surgery

## 2018-01-10 ENCOUNTER — Inpatient Hospital Stay (HOSPITAL_COMMUNITY): Payer: BLUE CROSS/BLUE SHIELD

## 2018-01-10 ENCOUNTER — Encounter (HOSPITAL_COMMUNITY): Payer: Self-pay

## 2018-01-10 ENCOUNTER — Inpatient Hospital Stay (HOSPITAL_COMMUNITY): Payer: BLUE CROSS/BLUE SHIELD | Admitting: Anesthesiology

## 2018-01-10 ENCOUNTER — Other Ambulatory Visit: Payer: Self-pay

## 2018-01-10 DIAGNOSIS — Z951 Presence of aortocoronary bypass graft: Secondary | ICD-10-CM

## 2018-01-10 HISTORY — PX: TEE WITHOUT CARDIOVERSION: SHX5443

## 2018-01-10 HISTORY — PX: CORONARY ARTERY BYPASS GRAFT: SHX141

## 2018-01-10 LAB — POCT I-STAT 3, ART BLOOD GAS (G3+)
ACID-BASE EXCESS: 1 mmol/L (ref 0.0–2.0)
Acid-base deficit: 2 mmol/L (ref 0.0–2.0)
Acid-base deficit: 3 mmol/L — ABNORMAL HIGH (ref 0.0–2.0)
Acid-base deficit: 1 mmol/L (ref 0.0–2.0)
Acid-base deficit: 3 mmol/L — ABNORMAL HIGH (ref 0.0–2.0)
Acid-base deficit: 3 mmol/L — ABNORMAL HIGH (ref 0.0–2.0)
Acid-base deficit: 3 mmol/L — ABNORMAL HIGH (ref 0.0–2.0)
BICARBONATE: 23.1 mmol/L (ref 20.0–28.0)
BICARBONATE: 23.7 mmol/L (ref 20.0–28.0)
Bicarbonate: 26.6 mmol/L (ref 20.0–28.0)
Bicarbonate: 22.2 mmol/L (ref 20.0–28.0)
Bicarbonate: 22.6 mmol/L (ref 20.0–28.0)
Bicarbonate: 22.7 mmol/L (ref 20.0–28.0)
Bicarbonate: 21.5 mmol/L (ref 20.0–28.0)
O2 SAT: 93 %
O2 SAT: 98 %
O2 SAT: 99 %
O2 Saturation: 100 %
O2 Saturation: 96 %
O2 Saturation: 97 %
O2 Saturation: 92 %
PCO2 ART: 41.5 mmHg (ref 32.0–48.0)
PCO2 ART: 38.8 mmHg (ref 32.0–48.0)
PH ART: 7.354 (ref 7.350–7.450)
PO2 ART: 356 mmHg — AB (ref 83.0–108.0)
PO2 ART: 86 mmHg (ref 83.0–108.0)
PO2 ART: 160 mmHg — AB (ref 83.0–108.0)
PO2 ART: 65 mmHg — AB (ref 83.0–108.0)
Patient temperature: 36.6
Patient temperature: 36.7
Patient temperature: 37.5
Patient temperature: 37
TCO2: 28 mmol/L (ref 22–32)
TCO2: 23 mmol/L (ref 22–32)
TCO2: 24 mmol/L (ref 22–32)
TCO2: 24 mmol/L (ref 22–32)
TCO2: 25 mmol/L (ref 22–32)
TCO2: 24 mmol/L (ref 22–32)
TCO2: 23 mmol/L (ref 22–32)
pCO2 arterial: 48.8 mmHg — ABNORMAL HIGH (ref 32.0–48.0)
pCO2 arterial: 40.2 mmHg (ref 32.0–48.0)
pCO2 arterial: 39.8 mmHg (ref 32.0–48.0)
pCO2 arterial: 42.3 mmHg (ref 32.0–48.0)
pCO2 arterial: 37.5 mmHg (ref 32.0–48.0)
pH, Arterial: 7.344 — ABNORMAL LOW (ref 7.350–7.450)
pH, Arterial: 7.35 (ref 7.350–7.450)
pH, Arterial: 7.36 (ref 7.350–7.450)
pH, Arterial: 7.392 (ref 7.350–7.450)
pH, Arterial: 7.341 — ABNORMAL LOW (ref 7.350–7.450)
pH, Arterial: 7.367 (ref 7.350–7.450)
pO2, Arterial: 69 mmHg — ABNORMAL LOW (ref 83.0–108.0)
pO2, Arterial: 100 mmHg (ref 83.0–108.0)
pO2, Arterial: 93 mmHg (ref 83.0–108.0)

## 2018-01-10 LAB — CREATININE, SERUM
Creatinine, Ser: 0.79 mg/dL (ref 0.61–1.24)
GFR calc Af Amer: >60 mL/min (ref >60)
GFR calc non Af Amer: >60 mL/min (ref >60)

## 2018-01-10 LAB — URINALYSIS, ROUTINE W REFLEX MICROSCOPIC
Bacteria, UA: NONE SEEN
Bilirubin Urine: NEGATIVE
Glucose, UA: NEGATIVE mg/dL
Hgb urine dipstick: NEGATIVE
Ketones, ur: NEGATIVE mg/dL
Leukocytes, UA: NEGATIVE
Nitrite: NEGATIVE
Protein, ur: NEGATIVE mg/dL
Specific Gravity, Urine: 1.023 (ref 1.005–1.030)
pH: 5 (ref 5.0–8.0)

## 2018-01-10 LAB — CBC
HCT: 36.4 % — ABNORMAL LOW (ref 39.0–52.0)
HEMATOCRIT: 39.6 % (ref 39.0–52.0)
HEMATOCRIT: 45.7 % (ref 39.0–52.0)
HEMOGLOBIN: 13.2 g/dL (ref 13.0–17.0)
HEMOGLOBIN: 15.3 g/dL (ref 13.0–17.0)
Hemoglobin: 12.3 g/dL — ABNORMAL LOW (ref 13.0–17.0)
MCH: 31.7 pg (ref 26.0–34.0)
MCH: 31.4 pg (ref 26.0–34.0)
MCH: 32.1 pg (ref 26.0–34.0)
MCHC: 33.5 g/dL (ref 30.0–36.0)
MCHC: 33.3 g/dL (ref 30.0–36.0)
MCHC: 33.8 g/dL (ref 30.0–36.0)
MCV: 94.3 fL (ref 78.0–100.0)
MCV: 94.8 fL (ref 78.0–100.0)
MCV: 95 fL (ref 78.0–100.0)
Platelets: 240 10*3/uL (ref 150–400)
Platelets: 155 10*3/uL (ref 150–400)
Platelets: 160 10*3/uL (ref 150–400)
RBC: 4.82 MIL/uL (ref 4.22–5.81)
RBC: 4.2 MIL/uL — ABNORMAL LOW (ref 4.22–5.81)
RBC: 3.83 MIL/uL — ABNORMAL LOW (ref 4.22–5.81)
RDW: 13 % (ref 11.5–15.5)
RDW: 13 % (ref 11.5–15.5)
RDW: 13.1 % (ref 11.5–15.5)
WBC: 14.7 10*3/uL — AB (ref 4.0–10.5)
WBC: 12.4 10*3/uL — AB (ref 4.0–10.5)
WBC: 10.2 10*3/uL (ref 4.0–10.5)

## 2018-01-10 LAB — POCT I-STAT, CHEM 8
BUN: 28 mg/dL — AB (ref 6–20)
BUN: 23 mg/dL — ABNORMAL HIGH (ref 6–20)
BUN: 23 mg/dL — ABNORMAL HIGH (ref 6–20)
BUN: 25 mg/dL — ABNORMAL HIGH (ref 6–20)
BUN: 23 mg/dL — AB (ref 6–20)
BUN: 21 mg/dL — ABNORMAL HIGH (ref 6–20)
CALCIUM ION: 1.17 mmol/L (ref 1.15–1.40)
CALCIUM ION: 0.91 mmol/L — AB (ref 1.15–1.40)
CHLORIDE: 101 mmol/L (ref 98–111)
CHLORIDE: 104 mmol/L (ref 98–111)
CREATININE: 0.8 mg/dL (ref 0.61–1.24)
CREATININE: 0.8 mg/dL (ref 0.61–1.24)
Calcium, Ion: 1 mmol/L — ABNORMAL LOW (ref 1.15–1.40)
Calcium, Ion: 1.04 mmol/L — ABNORMAL LOW (ref 1.15–1.40)
Calcium, Ion: 1.03 mmol/L — ABNORMAL LOW (ref 1.15–1.40)
Calcium, Ion: 1.06 mmol/L — ABNORMAL LOW (ref 1.15–1.40)
Chloride: 103 mmol/L (ref 98–111)
Chloride: 100 mmol/L (ref 98–111)
Chloride: 100 mmol/L (ref 98–111)
Chloride: 105 mmol/L (ref 98–111)
Creatinine, Ser: 0.7 mg/dL (ref 0.61–1.24)
Creatinine, Ser: 0.8 mg/dL (ref 0.61–1.24)
Creatinine, Ser: 0.8 mg/dL (ref 0.61–1.24)
Creatinine, Ser: 0.6 mg/dL — ABNORMAL LOW (ref 0.61–1.24)
Glucose, Bld: 109 mg/dL — ABNORMAL HIGH (ref 70–99)
Glucose, Bld: 94 mg/dL (ref 70–99)
Glucose, Bld: 142 mg/dL — ABNORMAL HIGH (ref 70–99)
Glucose, Bld: 146 mg/dL — ABNORMAL HIGH (ref 70–99)
Glucose, Bld: 137 mg/dL — ABNORMAL HIGH (ref 70–99)
Glucose, Bld: 107 mg/dL — ABNORMAL HIGH (ref 70–99)
HCT: 42 % (ref 39.0–52.0)
HCT: 36 % — ABNORMAL LOW (ref 39.0–52.0)
HEMATOCRIT: 32 % — AB (ref 39.0–52.0)
HEMATOCRIT: 32 % — AB (ref 39.0–52.0)
HEMATOCRIT: 33 % — AB (ref 39.0–52.0)
HEMATOCRIT: 33 % — AB (ref 39.0–52.0)
HEMOGLOBIN: 14.3 g/dL (ref 13.0–17.0)
HEMOGLOBIN: 10.9 g/dL — AB (ref 13.0–17.0)
HEMOGLOBIN: 11.2 g/dL — AB (ref 13.0–17.0)
Hemoglobin: 10.9 g/dL — ABNORMAL LOW (ref 13.0–17.0)
Hemoglobin: 11.2 g/dL — ABNORMAL LOW (ref 13.0–17.0)
Hemoglobin: 12.2 g/dL — ABNORMAL LOW (ref 13.0–17.0)
POTASSIUM: 4.5 mmol/L (ref 3.5–5.1)
POTASSIUM: 4.9 mmol/L (ref 3.5–5.1)
POTASSIUM: 3.7 mmol/L (ref 3.5–5.1)
Potassium: 3.9 mmol/L (ref 3.5–5.1)
Potassium: 4.1 mmol/L (ref 3.5–5.1)
Potassium: 4.3 mmol/L (ref 3.5–5.1)
SODIUM: 138 mmol/L (ref 135–145)
SODIUM: 139 mmol/L (ref 135–145)
SODIUM: 134 mmol/L — AB (ref 135–145)
SODIUM: 136 mmol/L (ref 135–145)
Sodium: 138 mmol/L (ref 135–145)
Sodium: 139 mmol/L (ref 135–145)
TCO2: 27 mmol/L (ref 22–32)
TCO2: 27 mmol/L (ref 22–32)
TCO2: 26 mmol/L (ref 22–32)
TCO2: 25 mmol/L (ref 22–32)
TCO2: 25 mmol/L (ref 22–32)
TCO2: 24 mmol/L (ref 22–32)

## 2018-01-10 LAB — BASIC METABOLIC PANEL
ANION GAP: 14 (ref 5–15)
BUN: 25 mg/dL — AB (ref 6–20)
CHLORIDE: 100 mmol/L (ref 98–111)
CO2: 23 mmol/L (ref 22–32)
Calcium: 9.3 mg/dL (ref 8.9–10.3)
Creatinine, Ser: 1.03 mg/dL (ref 0.61–1.24)
GFR calc Af Amer: >60 mL/min (ref >60)
GLUCOSE: 117 mg/dL — AB (ref 70–99)
POTASSIUM: 3.4 mmol/L — AB (ref 3.5–5.1)
Sodium: 137 mmol/L (ref 135–145)

## 2018-01-10 LAB — TYPE AND SCREEN
ABO/RH(D): A POS
Antibody Screen: NEGATIVE

## 2018-01-10 LAB — GLUCOSE, CAPILLARY
Glucose-Capillary: 118 mg/dL — ABNORMAL HIGH (ref 70–99)
Glucose-Capillary: 120 mg/dL — ABNORMAL HIGH (ref 70–99)
Glucose-Capillary: 123 mg/dL — ABNORMAL HIGH (ref 70–99)
Glucose-Capillary: 117 mg/dL — ABNORMAL HIGH (ref 70–99)
Glucose-Capillary: 94 mg/dL (ref 70–99)
Glucose-Capillary: 94 mg/dL (ref 70–99)
Glucose-Capillary: 113 mg/dL — ABNORMAL HIGH (ref 70–99)

## 2018-01-10 LAB — APTT: APTT: 24 s (ref 24–36)

## 2018-01-10 LAB — PROTIME-INR
INR: 0.96
INR: 1.24
PROTHROMBIN TIME: 15.5 s — AB (ref 11.4–15.2)
Prothrombin Time: 12.7 seconds (ref 11.4–15.2)

## 2018-01-10 LAB — HEMOGLOBIN AND HEMATOCRIT, BLOOD
HCT: 34.1 % — ABNORMAL LOW (ref 39.0–52.0)
Hemoglobin: 11.5 g/dL — ABNORMAL LOW (ref 13.0–17.0)

## 2018-01-10 LAB — POCT I-STAT 4, (NA,K, GLUC, HGB,HCT)
GLUCOSE: 113 mg/dL — AB (ref 70–99)
HCT: 38 % — ABNORMAL LOW (ref 39.0–52.0)
Hemoglobin: 12.9 g/dL — ABNORMAL LOW (ref 13.0–17.0)
Potassium: 3.7 mmol/L (ref 3.5–5.1)
Sodium: 139 mmol/L (ref 135–145)

## 2018-01-10 LAB — ABO/RH: ABO/RH(D): A POS

## 2018-01-10 LAB — PLATELET COUNT: Platelets: 152 10*3/uL (ref 150–400)

## 2018-01-10 LAB — MAGNESIUM: Magnesium: 3 mg/dL — ABNORMAL HIGH (ref 1.7–2.4)

## 2018-01-10 IMAGING — DX DG CHEST 1V PORT
1 series · 1 of 1 positions shown · non-contrast
Comparison: [DATE].

CLINICAL DATA: Status post CABG.

EXAM:
PORTABLE CHEST 1 VIEW

[chest]
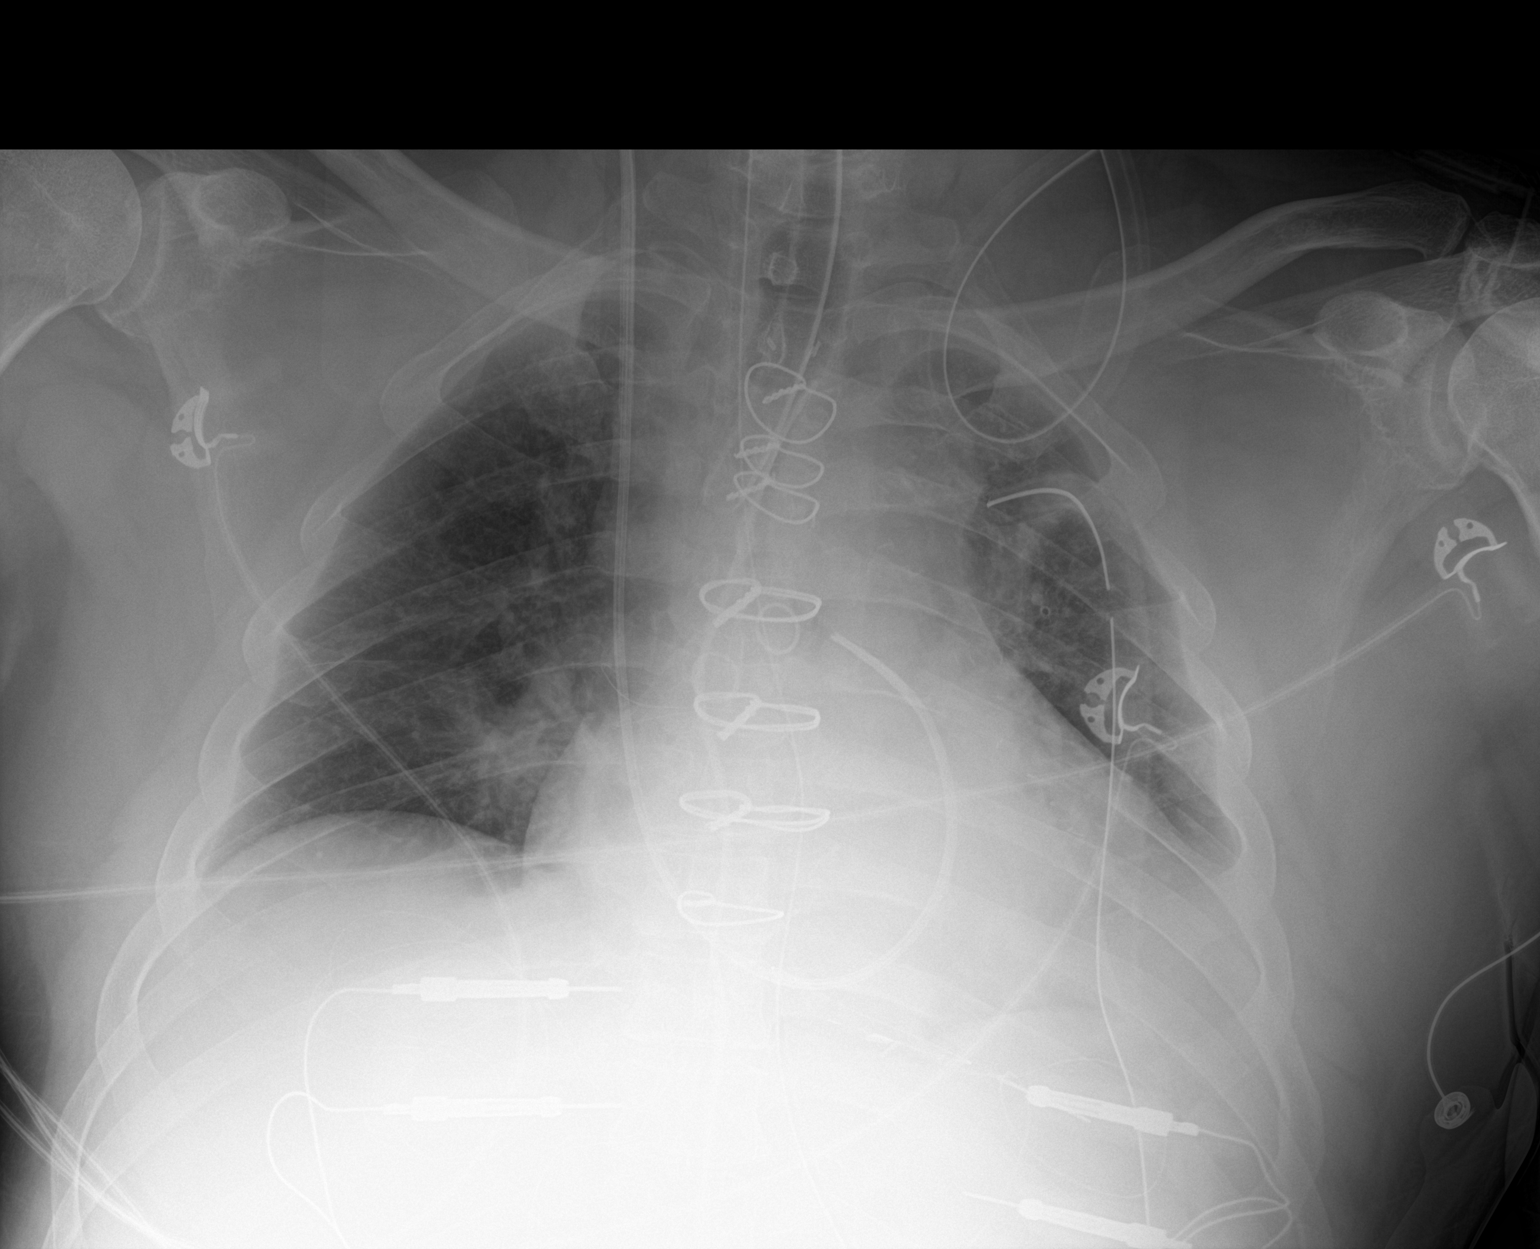

[1 of 1 positions shown; findings below may reference images not displayed]

FINDINGS: ET tube, Swan-Ganz catheter, chest tube, mediastinal tube and
nasogastric tube all in good position. Cardiomegaly. Mild vascular
congestion. LEFT lower lobe subsegmental atelectasis. No
pneumothorax. Epicardial pacing leads.
IMPRESSION: Satisfactory postoperative CABG chest.

## 2018-01-10 SURGERY — CORONARY ARTERY BYPASS GRAFTING (CABG)
Anesthesia: General | Site: Chest

## 2018-01-10 MED ORDER — PHENYLEPHRINE HCL-NACL 20-0.9 MG/250ML-% IV SOLN
0.0000 ug/min | INTRAVENOUS | Status: DC
  Filled 2018-01-10: qty 250

## 2018-01-10 MED ORDER — ACETAMINOPHEN 650 MG RE SUPP
650.0000 mg | Freq: Once | RECTAL | Status: AC
  Administered 2018-01-10: 650 mg via RECTAL

## 2018-01-10 MED ORDER — LACTATED RINGERS IV SOLN
INTRAVENOUS | Status: DC | PRN
  Administered 2018-01-10: 07:00:00 via INTRAVENOUS

## 2018-01-10 MED ORDER — LACTATED RINGERS IV SOLN: INTRAVENOUS | Status: DC

## 2018-01-10 MED ORDER — ALBUMIN HUMAN 5 % IV SOLN
250.0000 mL | INTRAVENOUS | Status: AC | PRN
  Administered 2018-01-10 (×2): 250 mL via INTRAVENOUS

## 2018-01-10 MED ORDER — ROCURONIUM BROMIDE 10 MG/ML (PF) SYRINGE
PREFILLED_SYRINGE | INTRAVENOUS | Status: DC | PRN
  Administered 2018-01-10 (×4): 50 mg via INTRAVENOUS

## 2018-01-10 MED ORDER — ACETAMINOPHEN 500 MG PO TABS
1000.0000 mg | ORAL_TABLET | Freq: Four times a day (QID) | ORAL | Status: DC
  Administered 2018-01-11 – 2018-01-15 (×19): 1000 mg via ORAL
  Filled 2018-01-10 (×19): qty 2

## 2018-01-10 MED ORDER — SODIUM CHLORIDE 0.9 % IV SOLN: 250.0000 mL | INTRAVENOUS | Status: DC

## 2018-01-10 MED ORDER — HEPARIN SODIUM (PORCINE) 1000 UNIT/ML IJ SOLN
INTRAMUSCULAR | Status: AC
  Filled 2018-01-10: qty 1

## 2018-01-10 MED ORDER — PROTAMINE SULFATE 10 MG/ML IV SOLN
INTRAVENOUS | Status: AC
  Filled 2018-01-10: qty 5

## 2018-01-10 MED ORDER — FAMOTIDINE IN NACL 20-0.9 MG/50ML-% IV SOLN: 20.0000 mg | Freq: Two times a day (BID) | INTRAVENOUS | Status: AC

## 2018-01-10 MED ORDER — MORPHINE SULFATE (PF) 2 MG/ML IV SOLN
1.0000 mg | INTRAVENOUS | Status: AC | PRN
  Administered 2018-01-10 (×3): 2 mg via INTRAVENOUS
  Filled 2018-01-10: qty 1
  Filled 2018-01-10: qty 2
  Filled 2018-01-10: qty 1

## 2018-01-10 MED ORDER — FENTANYL CITRATE (PF) 250 MCG/5ML IJ SOLN
INTRAMUSCULAR | Status: DC | PRN
  Administered 2018-01-10 (×2): 50 ug via INTRAVENOUS
  Administered 2018-01-10: 750 ug via INTRAVENOUS
  Administered 2018-01-10: 100 ug via INTRAVENOUS
  Administered 2018-01-10: 50 ug via INTRAVENOUS
  Administered 2018-01-10: 100 ug via INTRAVENOUS
  Administered 2018-01-10: 400 ug via INTRAVENOUS

## 2018-01-10 MED ORDER — LACTATED RINGERS IV SOLN
INTRAVENOUS | Status: DC
  Administered 2018-01-10 – 2018-01-11 (×2): via INTRAVENOUS

## 2018-01-10 MED ORDER — NITROGLYCERIN IN D5W 200-5 MCG/ML-% IV SOLN: 0.0000 ug/min | INTRAVENOUS | Status: DC

## 2018-01-10 MED ORDER — SODIUM CHLORIDE 0.9% FLUSH: 3.0000 mL | INTRAVENOUS | Status: DC | PRN

## 2018-01-10 MED ORDER — ACETAMINOPHEN 160 MG/5ML PO SOLN: 1000.0000 mg | Freq: Four times a day (QID) | ORAL | Status: DC

## 2018-01-10 MED ORDER — HEMOSTATIC AGENTS (NO CHARGE) OPTIME
TOPICAL | Status: DC | PRN
  Administered 2018-01-10 (×3): 1 via TOPICAL

## 2018-01-10 MED ORDER — HEPARIN SODIUM (PORCINE) 1000 UNIT/ML IJ SOLN
INTRAMUSCULAR | Status: DC | PRN
  Administered 2018-01-10: 35000 [IU] via INTRAVENOUS
  Administered 2018-01-10: 5000 [IU] via INTRAVENOUS

## 2018-01-10 MED ORDER — PANTOPRAZOLE SODIUM 40 MG PO TBEC
40.0000 mg | DELAYED_RELEASE_TABLET | Freq: Every day | ORAL | Status: DC
  Administered 2018-01-12 – 2018-01-15 (×4): 40 mg via ORAL
  Filled 2018-01-10 (×4): qty 1

## 2018-01-10 MED ORDER — PROTAMINE SULFATE 10 MG/ML IV SOLN
INTRAVENOUS | Status: AC
  Filled 2018-01-10: qty 25

## 2018-01-10 MED ORDER — ALBUMIN HUMAN 5 % IV SOLN
INTRAVENOUS | Status: DC | PRN
  Administered 2018-01-10: 12:00:00 via INTRAVENOUS

## 2018-01-10 MED ORDER — LEVALBUTEROL HCL 0.63 MG/3ML IN NEBU
0.6300 mg | INHALATION_SOLUTION | Freq: Three times a day (TID) | RESPIRATORY_TRACT | Status: DC
  Administered 2018-01-11 – 2018-01-12 (×5): 0.63 mg via RESPIRATORY_TRACT
  Filled 2018-01-10 (×5): qty 3

## 2018-01-10 MED ORDER — MIDAZOLAM HCL 10 MG/2ML IJ SOLN
INTRAMUSCULAR | Status: AC
  Filled 2018-01-10: qty 2

## 2018-01-10 MED ORDER — POTASSIUM CHLORIDE 10 MEQ/50ML IV SOLN
10.0000 meq | INTRAVENOUS | Status: AC
  Administered 2018-01-10 (×3): 10 meq via INTRAVENOUS

## 2018-01-10 MED ORDER — FENTANYL CITRATE (PF) 250 MCG/5ML IJ SOLN
INTRAMUSCULAR | Status: AC
  Filled 2018-01-10: qty 25

## 2018-01-10 MED ORDER — MIDAZOLAM HCL 5 MG/5ML IJ SOLN
INTRAMUSCULAR | Status: DC | PRN
  Administered 2018-01-10: 1 mg via INTRAVENOUS
  Administered 2018-01-10: 3 mg via INTRAVENOUS
  Administered 2018-01-10: 4 mg via INTRAVENOUS
  Administered 2018-01-10: 2 mg via INTRAVENOUS

## 2018-01-10 MED ORDER — FENTANYL CITRATE (PF) 250 MCG/5ML IJ SOLN
INTRAMUSCULAR | Status: AC
  Filled 2018-01-10: qty 5

## 2018-01-10 MED ORDER — METOPROLOL TARTRATE 5 MG/5ML IV SOLN
2.5000 mg | INTRAVENOUS | Status: DC | PRN
  Administered 2018-01-12: 5 mg via INTRAVENOUS
  Administered 2018-01-13: 2.5 mg via INTRAVENOUS
  Filled 2018-01-10 (×2): qty 5

## 2018-01-10 MED ORDER — SODIUM CHLORIDE 0.9 % IV SOLN
INTRAVENOUS | Status: DC
  Administered 2018-01-10: 14:00:00 via INTRAVENOUS

## 2018-01-10 MED ORDER — CHLORHEXIDINE GLUCONATE 0.12 % MT SOLN
15.0000 mL | OROMUCOSAL | Status: AC
  Administered 2018-01-10: 15 mL via OROMUCOSAL

## 2018-01-10 MED ORDER — TRAMADOL HCL 50 MG PO TABS
50.0000 mg | ORAL_TABLET | ORAL | Status: DC | PRN
  Administered 2018-01-11 – 2018-01-14 (×6): 100 mg via ORAL
  Filled 2018-01-10 (×6): qty 2

## 2018-01-10 MED ORDER — METOCLOPRAMIDE HCL 5 MG/ML IJ SOLN
10.0000 mg | Freq: Four times a day (QID) | INTRAMUSCULAR | Status: AC
  Administered 2018-01-10 – 2018-01-14 (×18): 10 mg via INTRAVENOUS
  Filled 2018-01-10 (×18): qty 2

## 2018-01-10 MED ORDER — MAGNESIUM SULFATE 4 GM/100ML IV SOLN
4.0000 g | Freq: Once | INTRAVENOUS | Status: AC
  Administered 2018-01-10: 4 g via INTRAVENOUS
  Filled 2018-01-10: qty 100

## 2018-01-10 MED ORDER — PROPOFOL 10 MG/ML IV BOLUS
INTRAVENOUS | Status: DC | PRN
  Administered 2018-01-10: 30 mg via INTRAVENOUS
  Administered 2018-01-10: 50 mg via INTRAVENOUS

## 2018-01-10 MED ORDER — BISACODYL 10 MG RE SUPP: 10.0000 mg | Freq: Every day | RECTAL | Status: DC

## 2018-01-10 MED ORDER — 0.9 % SODIUM CHLORIDE (POUR BTL) OPTIME
TOPICAL | Status: DC | PRN
  Administered 2018-01-10: 1000 mL

## 2018-01-10 MED ORDER — VANCOMYCIN HCL IN DEXTROSE 1-5 GM/200ML-% IV SOLN
1000.0000 mg | Freq: Once | INTRAVENOUS | Status: AC
  Administered 2018-01-10: 1000 mg via INTRAVENOUS
  Filled 2018-01-10: qty 200

## 2018-01-10 MED ORDER — OXYCODONE HCL 5 MG PO TABS
5.0000 mg | ORAL_TABLET | ORAL | Status: DC | PRN
  Administered 2018-01-11 – 2018-01-13 (×13): 10 mg via ORAL
  Administered 2018-01-13: 5 mg via ORAL
  Administered 2018-01-14 – 2018-01-15 (×4): 10 mg via ORAL
  Filled 2018-01-10 (×8): qty 2
  Filled 2018-01-10: qty 1
  Filled 2018-01-10 (×9): qty 2

## 2018-01-10 MED ORDER — ASPIRIN 81 MG PO CHEW
324.0000 mg | CHEWABLE_TABLET | Freq: Every day | ORAL | Status: DC
  Administered 2018-01-15: 324 mg
  Filled 2018-01-10: qty 4

## 2018-01-10 MED ORDER — LEVALBUTEROL HCL 0.63 MG/3ML IN NEBU
0.6300 mg | INHALATION_SOLUTION | Freq: Four times a day (QID) | RESPIRATORY_TRACT | Status: DC
  Administered 2018-01-10: 0.63 mg via RESPIRATORY_TRACT
  Filled 2018-01-10: qty 3

## 2018-01-10 MED ORDER — PROPOFOL 10 MG/ML IV BOLUS
INTRAVENOUS | Status: AC
  Filled 2018-01-10: qty 20

## 2018-01-10 MED ORDER — DEXMEDETOMIDINE HCL IN NACL 200 MCG/50ML IV SOLN
0.0000 ug/kg/h | INTRAVENOUS | Status: DC
  Filled 2018-01-10: qty 50

## 2018-01-10 MED ORDER — BISACODYL 5 MG PO TBEC
10.0000 mg | DELAYED_RELEASE_TABLET | Freq: Every day | ORAL | Status: DC
  Administered 2018-01-11 – 2018-01-15 (×4): 10 mg via ORAL
  Filled 2018-01-10 (×4): qty 2

## 2018-01-10 MED ORDER — ONDANSETRON HCL 4 MG/2ML IJ SOLN: 4.0000 mg | Freq: Four times a day (QID) | INTRAMUSCULAR | Status: DC | PRN

## 2018-01-10 MED ORDER — ACETAMINOPHEN 160 MG/5ML PO SOLN: 650.0000 mg | Freq: Once | ORAL | Status: AC

## 2018-01-10 MED ORDER — METOPROLOL TARTRATE 12.5 MG HALF TABLET: 12.5000 mg | ORAL_TABLET | Freq: Two times a day (BID) | ORAL | Status: DC

## 2018-01-10 MED ORDER — IPRATROPIUM-ALBUTEROL 0.5-2.5 (3) MG/3ML IN SOLN: 3.0000 mL | Freq: Two times a day (BID) | RESPIRATORY_TRACT | Status: DC

## 2018-01-10 MED ORDER — SODIUM CHLORIDE 0.9% FLUSH
3.0000 mL | Freq: Two times a day (BID) | INTRAVENOUS | Status: DC
  Administered 2018-01-11 – 2018-01-13 (×5): 3 mL via INTRAVENOUS

## 2018-01-10 MED ORDER — INSULIN REGULAR BOLUS VIA INFUSION
0.0000 [IU] | Freq: Three times a day (TID) | INTRAVENOUS | Status: DC
  Filled 2018-01-10: qty 10

## 2018-01-10 MED ORDER — MIDAZOLAM HCL 2 MG/2ML IJ SOLN: 2.0000 mg | INTRAMUSCULAR | Status: DC | PRN

## 2018-01-10 MED ORDER — LACTATED RINGERS IV SOLN: 500.0000 mL | Freq: Once | INTRAVENOUS | Status: DC | PRN

## 2018-01-10 MED ORDER — PROTAMINE SULFATE 10 MG/ML IV SOLN
INTRAVENOUS | Status: DC | PRN
  Administered 2018-01-10: 340 mg via INTRAVENOUS
  Administered 2018-01-10: 10 mg via INTRAVENOUS

## 2018-01-10 MED ORDER — MORPHINE SULFATE (PF) 2 MG/ML IV SOLN
2.0000 mg | INTRAVENOUS | Status: DC | PRN
  Administered 2018-01-10: 2 mg via INTRAVENOUS
  Administered 2018-01-11: 4 mg via INTRAVENOUS
  Administered 2018-01-11: 2 mg via INTRAVENOUS
  Administered 2018-01-11 (×2): 4 mg via INTRAVENOUS
  Administered 2018-01-12: 2 mg via INTRAVENOUS
  Administered 2018-01-12: 4 mg via INTRAVENOUS
  Filled 2018-01-10 (×4): qty 2
  Filled 2018-01-10 (×3): qty 1

## 2018-01-10 MED ORDER — METOPROLOL TARTRATE 25 MG/10 ML ORAL SUSPENSION: 12.5000 mg | Freq: Two times a day (BID) | ORAL | Status: DC

## 2018-01-10 MED ORDER — SODIUM CHLORIDE 0.9 % IV SOLN
INTRAVENOUS | Status: DC
  Filled 2018-01-10: qty 1

## 2018-01-10 MED ORDER — PLASMA-LYTE 148 IV SOLN
INTRAVENOUS | Status: DC | PRN
  Administered 2018-01-10: 500 mL via INTRAVASCULAR

## 2018-01-10 MED ORDER — SODIUM CHLORIDE 0.45 % IV SOLN
INTRAVENOUS | Status: DC | PRN
  Administered 2018-01-10: 14:00:00 via INTRAVENOUS

## 2018-01-10 MED ORDER — PHENYLEPHRINE HCL 10 MG/ML IJ SOLN
INTRAMUSCULAR | Status: DC | PRN
  Administered 2018-01-10: 80 ug via INTRAVENOUS

## 2018-01-10 MED ORDER — DOCUSATE SODIUM 100 MG PO CAPS
200.0000 mg | ORAL_CAPSULE | Freq: Every day | ORAL | Status: DC
  Administered 2018-01-11 – 2018-01-15 (×4): 200 mg via ORAL
  Filled 2018-01-10 (×4): qty 2

## 2018-01-10 MED ORDER — ASPIRIN EC 325 MG PO TBEC
325.0000 mg | DELAYED_RELEASE_TABLET | Freq: Every day | ORAL | Status: DC
  Administered 2018-01-11 – 2018-01-14 (×4): 325 mg via ORAL
  Filled 2018-01-10 (×4): qty 1

## 2018-01-10 MED ORDER — SODIUM CHLORIDE 0.9 % IV SOLN
1.5000 g | Freq: Two times a day (BID) | INTRAVENOUS | Status: AC
  Administered 2018-01-10 – 2018-01-12 (×4): 1.5 g via INTRAVENOUS
  Filled 2018-01-10 (×4): qty 1.5

## 2018-01-10 SURGICAL SUPPLY — 77 items
BAG DECANTER FOR FLEXI CONT (MISCELLANEOUS) ×2 IMPLANT
BANDAGE ACE 4X5 VEL STRL LF (GAUZE/BANDAGES/DRESSINGS) ×2 IMPLANT
BANDAGE ACE 6X5 VEL STRL LF (GAUZE/BANDAGES/DRESSINGS) ×2 IMPLANT
BLADE STERNUM SYSTEM 6 (BLADE) ×2 IMPLANT
BLADE SURG 11 STRL SS (BLADE) ×2 IMPLANT
BNDG GAUZE ELAST 4 BULKY (GAUZE/BANDAGES/DRESSINGS) ×2 IMPLANT
CANISTER SUCT 3000ML PPV (MISCELLANEOUS) ×2 IMPLANT
CATH CPB KIT GERHARDT (MISCELLANEOUS) ×2 IMPLANT
CATH THORACIC 28FR (CATHETERS) ×2 IMPLANT
CRADLE DONUT ADULT HEAD (MISCELLANEOUS) ×2 IMPLANT
DERMABOND ADVANCED (GAUZE/BANDAGES/DRESSINGS) ×1
DERMABOND ADVANCED .7 DNX12 (GAUZE/BANDAGES/DRESSINGS) ×1 IMPLANT
DRAIN CHANNEL 28F RND 3/8 FF (WOUND CARE) ×2 IMPLANT
DRAPE CARDIOVASCULAR INCISE (DRAPES) ×1
DRAPE SLUSH/WARMER DISC (DRAPES) ×2 IMPLANT
DRAPE SRG 135X102X78XABS (DRAPES) ×1 IMPLANT
DRSG AQUACEL AG ADV 3.5X14 (GAUZE/BANDAGES/DRESSINGS) ×2 IMPLANT
ELECT BLADE 4.0 EZ CLEAN MEGAD (MISCELLANEOUS) ×2
ELECT REM PT RETURN 9FT ADLT (ELECTROSURGICAL) ×4
ELECTRODE BLDE 4.0 EZ CLN MEGD (MISCELLANEOUS) ×1 IMPLANT
ELECTRODE REM PT RTRN 9FT ADLT (ELECTROSURGICAL) ×2 IMPLANT
FELT TEFLON 1X6 (MISCELLANEOUS) ×2 IMPLANT
FLOSEAL 5ML (HEMOSTASIS) ×2 IMPLANT
GAUZE SPONGE 4X4 12PLY STRL (GAUZE/BANDAGES/DRESSINGS) ×4 IMPLANT
GAUZE SPONGE 4X4 12PLY STRL LF (GAUZE/BANDAGES/DRESSINGS) ×4 IMPLANT
GLOVE BIO SURGEON STRL SZ 6.5 (GLOVE) ×6 IMPLANT
GLOVE BIOGEL M 6.5 STRL (GLOVE) ×16 IMPLANT
GLOVE BIOGEL PI IND STRL 6.5 (GLOVE) ×2 IMPLANT
GLOVE BIOGEL PI IND STRL 7.5 (GLOVE) ×3 IMPLANT
GLOVE BIOGEL PI IND STRL 8.5 (GLOVE) ×1 IMPLANT
GLOVE BIOGEL PI INDICATOR 6.5 (GLOVE) ×2
GLOVE BIOGEL PI INDICATOR 7.5 (GLOVE) ×3
GLOVE BIOGEL PI INDICATOR 8.5 (GLOVE) ×1
GOWN STRL REUS W/ TWL LRG LVL3 (GOWN DISPOSABLE) ×10 IMPLANT
GOWN STRL REUS W/TWL LRG LVL3 (GOWN DISPOSABLE) ×10
HEMOSTAT POWDER SURGIFOAM 1G (HEMOSTASIS) ×6 IMPLANT
HEMOSTAT SURGICEL 2X14 (HEMOSTASIS) ×4 IMPLANT
KIT BASIN OR (CUSTOM PROCEDURE TRAY) ×2 IMPLANT
KIT CATH SUCT 8FR (CATHETERS) ×2 IMPLANT
KIT SUCTION CATH 14FR (SUCTIONS) ×6 IMPLANT
KIT TURNOVER KIT B (KITS) ×2 IMPLANT
KIT VASOVIEW HEMOPRO VH 3000 (KITS) ×2 IMPLANT
LEAD PACING MYOCARDI (MISCELLANEOUS) ×2 IMPLANT
MARKER GRAFT CORONARY BYPASS (MISCELLANEOUS) ×6 IMPLANT
NS IRRIG 1000ML POUR BTL (IV SOLUTION) ×10 IMPLANT
PACK E OPEN HEART (SUTURE) ×2 IMPLANT
PACK OPEN HEART (CUSTOM PROCEDURE TRAY) ×2 IMPLANT
PAD ARMBOARD 7.5X6 YLW CONV (MISCELLANEOUS) ×4 IMPLANT
PAD ELECT DEFIB RADIOL ZOLL (MISCELLANEOUS) ×2 IMPLANT
PENCIL BUTTON HOLSTER BLD 10FT (ELECTRODE) ×2 IMPLANT
POWDER SURGICEL 3.0 GRAM (HEMOSTASIS) ×2 IMPLANT
PUNCH AORTIC ROTATE  4.5MM 8IN (MISCELLANEOUS) ×2 IMPLANT
SET CARDIOPLEGIA MPS 5001102 (MISCELLANEOUS) ×2 IMPLANT
SOLUTION ANTI FOG 6CC (MISCELLANEOUS) ×2 IMPLANT
SPONGE LAP 18X18 X RAY DECT (DISPOSABLE) ×6 IMPLANT
SUT BONE WAX W31G (SUTURE) ×2 IMPLANT
SUT MNCRL AB 4-0 PS2 18 (SUTURE) ×4 IMPLANT
SUT PROLENE 3 0 SH1 36 (SUTURE) ×6 IMPLANT
SUT PROLENE 4 0 TF (SUTURE) ×4 IMPLANT
SUT PROLENE 6 0 CC (SUTURE) ×4 IMPLANT
SUT PROLENE 7 0 BV1 MDA (SUTURE) ×4 IMPLANT
SUT PROLENE 7.0 RB 3 (SUTURE) ×8 IMPLANT
SUT PROLENE 8 0 BV175 6 (SUTURE) ×12 IMPLANT
SUT STEEL 6MS V (SUTURE) ×2 IMPLANT
SUT STEEL SZ 6 DBL 3X14 BALL (SUTURE) ×2 IMPLANT
SUT VIC AB 1 CTX 18 (SUTURE) ×4 IMPLANT
SUT VIC AB 2-0 CT1 27 (SUTURE) ×1
SUT VIC AB 2-0 CT1 TAPERPNT 27 (SUTURE) ×1 IMPLANT
SYSTEM SAHARA CHEST DRAIN ATS (WOUND CARE) ×2 IMPLANT
TAPE CLOTH SURG 4X10 WHT LF (GAUZE/BANDAGES/DRESSINGS) ×6 IMPLANT
TAPE PAPER 2X10 WHT MICROPORE (GAUZE/BANDAGES/DRESSINGS) ×2 IMPLANT
TOWEL GREEN STERILE (TOWEL DISPOSABLE) ×2 IMPLANT
TOWEL GREEN STERILE FF (TOWEL DISPOSABLE) ×2 IMPLANT
TRAY FOLEY SLVR 16FR TEMP STAT (SET/KITS/TRAYS/PACK) ×2 IMPLANT
TUBING INSUFFLATION (TUBING) ×2 IMPLANT
UNDERPAD 30X30 (UNDERPADS AND DIAPERS) ×2 IMPLANT
WATER STERILE IRR 1000ML POUR (IV SOLUTION) ×4 IMPLANT

## 2018-01-10 NOTE — Progress Notes (Signed)
1658: Rapid wean started at this time, pt tolerating well.

## 2018-01-10 NOTE — Anesthesia Procedure Notes (Signed)
Procedure Name: Intubation Date/Time: 01/10/2018 7:56 AM Performed by: Clearnce Sorrel, CRNA Pre-anesthesia Checklist: Patient identified, Emergency Drugs available, Suction available, Patient being monitored and Timeout performed Patient Re-evaluated:Patient Re-evaluated prior to induction Oxygen Delivery Method: Circle system utilized Preoxygenation: Pre-oxygenation with 100% oxygen Induction Type: IV induction Ventilation: Mask ventilation without difficulty and Oral airway inserted - appropriate to patient size Laryngoscope Size: Mac and 4 Grade View: Grade I Tube type: Oral Tube size: 8.0 mm Number of attempts: 1 Airway Equipment and Method: Stylet Placement Confirmation: ETT inserted through vocal cords under direct vision,  positive ETCO2 and breath sounds checked- equal and bilateral Secured at: 24 cm Tube secured with: Tape Dental Injury: Teeth and Oropharynx as per pre-operative assessment

## 2018-01-10 NOTE — Brief Op Note (Signed)
      [address]       [place] [id number]             [phone]     01/10/2018  1:59 PM  PATIENT:  Edward Powell  47 y.o. male  PRE-OPERATIVE DIAGNOSIS:  CAD  POST-OPERATIVE DIAGNOSIS:  CAD  PROCEDURE:  Procedure(s) with comments:  CORONARY ARTERY BYPASS GRAFTING x 4 -LIMA to LAD -SEQ SVG to intermediate and distal cx  -SVG to PDA  ENDOSCOPIC HARVEST OF GREATER SAPHENOUS VEIN -Left Leg -Right Leg Opened, not harvested  TRANSESOPHAGEAL ECHOCARDIOGRAM (TEE) (N/A)  SURGEON:  Surgeon(s) and Role:    * Delight OvensGerhardt, Nahsir Venezia B, MD - Primary  PHYSICIAN ASSISTANT: Lowella DandyErin Barrett PA-C  ANESTHESIA:   general  EBL:  550 mL   BLOOD ADMINISTERED: CELLSAVER  DRAINS: Left Pleural Chest Tube, Mediastinal Chest Drians   LOCAL MEDICATIONS USED:  NONE  SPECIMEN:  No Specimen  DISPOSITION OF SPECIMEN:  N/A  COUNTS:  YES   DICTATION: .Dragon Dictation  PLAN OF CARE: Admit to inpatient   PATIENT DISPOSITION:  ICU - intubated and hemodynamically stable.   Delay start of Pharmacological VTE agent (>24hrs) due to surgical blood loss or risk of bleeding: yes

## 2018-01-10 NOTE — Progress Notes (Signed)
Pt experiencing abdominal spasms from epicardial pacer. MD made aware of situation and informed that CI is lower when pacer is off. MD stated okay to turn pacer off and have lower CI.

## 2018-01-10 NOTE — Progress Notes (Signed)
Recruitment performed per Dr. Tyrone SageGerhardt. Spo2 started at 92 and after 2 minutes of recruitment Spo2 rose to 100%. Will wean Fio2 and PEEP as tolerated.

## 2018-01-10 NOTE — Anesthesia Postprocedure Evaluation (Signed)
Anesthesia Post Note  Patient: Ignatius SpeckingDarin W Godette  Procedure(s) Performed: CORONARY ARTERY BYPASS GRAFTING (CABG) (N/A Chest) TRANSESOPHAGEAL ECHOCARDIOGRAM (TEE) (N/A Chest)     Patient location during evaluation: SICU Anesthesia Type: General Level of consciousness: sedated Pain management: pain level controlled Vital Signs Assessment: post-procedure vital signs reviewed and stable Respiratory status: patient remains intubated per anesthesia plan Cardiovascular status: stable Postop Assessment: no apparent nausea or vomiting Anesthetic complications: no    Last Vitals:  Vitals:   01/10/18 1645 01/10/18 1700  BP:  102/65  Pulse: 80 68  Resp: 16 15  Temp: 37.2 C 37.4 C  SpO2: 97% 97%    Last Pain:  Vitals:   01/10/18 0533  TempSrc: Oral  PainSc:                  Brayley Mackowiak DAVID

## 2018-01-10 NOTE — Plan of Care (Signed)
  Problem: Cardiac: Goal: Ability to achieve and maintain adequate cardiopulmonary perfusion will improve Outcome: Progressing Pt is adequately maintaining perfusion and is hemodynamically stable .

## 2018-01-10 NOTE — Progress Notes (Signed)
[facility] BRIEF SICU PROGRESS NOTE  Day of Surgery  S/P Procedure(s) (LRB): CORONARY ARTERY BYPASS GRAFTING (CABG) (N/A) TRANSESOPHAGEAL ECHOCARDIOGRAM (TEE) (N/A)   Sedated on vent NSR w/ stable hemodynamics O2 sats 100% Chest tube output low UOP excellent Labs okay  Plan: Continue routine early postop  Purcell Nailslarence H Owen, MD 01/10/2018 5:00 PM

## 2018-01-10 NOTE — Anesthesia Procedure Notes (Signed)
Arterial Line Insertion Start/End7/24/2019 7:10 AM, 01/10/2018 7:10 AM Performed by: CRNA  Patient location: Pre-op. Preanesthetic checklist: patient identified, IV checked, site marked, risks and benefits discussed, surgical consent, monitors and equipment checked, pre-op evaluation, timeout performed and anesthesia consent Lidocaine 1% used for infiltration Left, radial was placed Catheter size: 20 Fr Hand hygiene performed  and maximum sterile barriers used   Attempts: 1 Procedure performed without using ultrasound guided technique. Following insertion, dressing applied. Post procedure assessment: normal and unchanged

## 2018-01-10 NOTE — Progress Notes (Signed)
      [address]       [place] [id number]             [phone]     Patient ID: Edward Powell, male   DOB: 08/31/1970, 47 y.o.   MRN: [id number] Pre Procedure note for inpatients:   Edward Powell has been scheduled for Procedure(s): CORONARY ARTERY BYPASS GRAFTING (CABG) (N/A) TRANSESOPHAGEAL ECHOCARDIOGRAM (TEE) (N/A) today. The various methods of treatment have been discussed with the patient. After consideration of the risks, benefits and treatment options the patient has consented to the planned procedure.   The patient has been seen and labs reviewed. There are no changes in the patient's condition to prevent proceeding with the planned procedure today.  Recent labs:  Lab Results  Component Value Date   WBC 12.4 (H) 01/10/2018   HGB 15.3 01/10/2018   HCT 45.7 01/10/2018   PLT 240 01/10/2018   GLUCOSE 117 (H) 01/10/2018   CHOL 318 (H) 01/09/2018   TRIG 265 (H) 01/09/2018   HDL 58 01/09/2018   LDLCALC 207 (H) 01/09/2018   ALT 103 (H) 01/09/2018   AST 44 (H) 01/09/2018   NA 137 01/10/2018   K 3.4 (L) 01/10/2018   CL 100 01/10/2018   CREATININE 1.03 01/10/2018   BUN 25 (H) 01/10/2018   CO2 23 01/10/2018   INR 0.96 01/09/2018   HGBA1C 5.7 (H) 01/09/2018    Delight OvensEdward B Talley Kreiser, MD 01/10/2018 7:11 AM

## 2018-01-10 NOTE — Progress Notes (Signed)
  Echocardiogram Echocardiogram Transesophageal has been performed.  Edward PartridgeBrooke S Terrace Powell 01/10/2018, 8:36 AM

## 2018-01-10 NOTE — Anesthesia Procedure Notes (Signed)
Central Venous Catheter Insertion Performed by: Lewie LoronGermeroth, Rainer Mounce, MD, anesthesiologist Start/End7/24/2019 7:00 AM, 01/10/2018 7:05 AM Patient location: Pre-op. Preanesthetic checklist: patient identified, IV checked, site marked, risks and benefits discussed, surgical consent, monitors and equipment checked, pre-op evaluation, timeout performed and anesthesia consent Hand hygiene performed  and maximum sterile barriers used  PA cath was placed.Swan type:thermodilution PA Cath depth:41 Procedure performed without using ultrasound guided technique. Attempts: 1 Patient tolerated the procedure well with no immediate complications.

## 2018-01-10 NOTE — Anesthesia Procedure Notes (Addendum)
Central Venous Catheter Insertion Performed by: Lewie LoronGermeroth, Storm Dulski, MD, anesthesiologist Start/End7/24/2019 6:45 AM, 01/10/2018 7:05 AM Patient location: Pre-op. Preanesthetic checklist: patient identified, IV checked, site marked, risks and benefits discussed, surgical consent, monitors and equipment checked, pre-op evaluation, timeout performed and anesthesia consent Position: Trendelenburg Lidocaine 1% used for infiltration and patient sedated Hand hygiene performed , maximum sterile barriers used  and Seldinger technique used Catheter size: 8.5 Fr Total catheter length 10. Central line was placed.Sheath introducer Swan type:thermodilution PA Cath depth:50 Procedure performed using ultrasound guided technique. Ultrasound Notes:anatomy identified, needle tip was noted to be adjacent to the nerve/plexus identified, no ultrasound evidence of intravascular and/or intraneural injection and image(s) printed for medical record Attempts: 1 Following insertion, line sutured, dressing applied and Biopatch. Post procedure assessment: blood return through all ports, free fluid flow and no air  Patient tolerated the procedure well with no immediate complications.

## 2018-01-10 NOTE — Anesthesia Preprocedure Evaluation (Signed)
Anesthesia Evaluation  Patient identified by MRN, date of birth, ID band Patient awake    Reviewed: Allergy & Precautions, NPO status , Patient's Chart, lab work & pertinent test results  Airway Mallampati: I  TM Distance: >3 FB Neck ROM: Full    Dental   Pulmonary Current Smoker,    Pulmonary exam normal        Cardiovascular hypertension, Pt. on medications + Past MI  Normal cardiovascular exam     Neuro/Psych    GI/Hepatic   Endo/Other    Renal/GU      Musculoskeletal   Abdominal   Peds  Hematology   Anesthesia Other Findings   Reproductive/Obstetrics                             Anesthesia Physical Anesthesia Plan  ASA: III  Anesthesia Plan: General   Post-op Pain Management:    Induction: Intravenous  PONV Risk Score and Plan: 1 and Treatment may vary due to age or medical condition  Airway Management Planned: Oral ETT  Additional Equipment: Arterial line, CVP, PA Cath, TEE and Ultrasound Guidance Line Placement  Intra-op Plan:   Post-operative Plan: Post-operative intubation/ventilation  Informed Consent: I have reviewed the patients History and Physical, chart, labs and discussed the procedure including the risks, benefits and alternatives for the proposed anesthesia with the patient or authorized representative who has indicated his/her understanding and acceptance.     Plan Discussed with: CRNA and Surgeon  Anesthesia Plan Comments:         Anesthesia Quick Evaluation

## 2018-01-10 NOTE — Procedures (Signed)
Extubation Procedure Note  Patient Details:   Name: Edward Powell DOB: October 14, 1970 MRN: [id number]   Airway Documentation:    Vent end date: 01/10/18 Vent end time: 1748   Evaluation  O2 sats: stable throughout Complications: No apparent complications Patient did tolerate procedure well. Bilateral Breath Sounds: Clear, Diminished   Pt extubated per rapid wean protocol. VC 1L, NIF -30.  Pt had +cuff leak.  Pt able to voice and has strong cough, no distress noted at this time  Edward Powell, Leza Apsey 01/10/2018, 5:54 PM

## 2018-01-10 NOTE — Transfer of Care (Signed)
Immediate Anesthesia Transfer of Care Note  Patient: Edward Powell  Procedure(s) Performed: CORONARY ARTERY BYPASS GRAFTING (CABG) (N/A Chest) TRANSESOPHAGEAL ECHOCARDIOGRAM (TEE) (N/A Chest)  Patient Location: SICU  Anesthesia Type:General  Level of Consciousness: Patient remains intubated per anesthesia plan  Airway & Oxygen Therapy: Patient remains intubated per anesthesia plan  Post-op Assessment: Report given to RN and Post -op Vital signs reviewed and stable  Post vital signs: Reviewed and stable  Last Vitals:  Vitals Value Taken Time  BP 113/63 01/10/2018  1:49 PM  Temp    Pulse 80 01/10/2018  1:49 PM  Resp 12 01/10/2018  1:49 PM  SpO2 92 % 01/10/2018  1:49 PM    Last Pain:  Vitals:   01/10/18 0533  TempSrc: Oral  PainSc:       Patients Stated Pain Goal: 0 (01/09/18 1800)  Complications: No apparent anesthesia complications

## 2018-01-10 NOTE — Progress Notes (Signed)
Transported pt on vent from OR to 2H02 with OR team due to low SPo2 without complications. Spo2 remained 91% during transport.

## 2018-01-11 ENCOUNTER — Encounter (HOSPITAL_COMMUNITY): Payer: Self-pay | Admitting: Cardiothoracic Surgery

## 2018-01-11 ENCOUNTER — Inpatient Hospital Stay (HOSPITAL_COMMUNITY): Payer: BLUE CROSS/BLUE SHIELD

## 2018-01-11 ENCOUNTER — Telehealth: Payer: Self-pay | Admitting: Cardiovascular Disease

## 2018-01-11 DIAGNOSIS — Z951 Presence of aortocoronary bypass graft: Secondary | ICD-10-CM

## 2018-01-11 LAB — CBC
HCT: 39.5 % (ref 39.0–52.0)
HEMATOCRIT: 42.6 % (ref 39.0–52.0)
HEMOGLOBIN: 13.1 g/dL (ref 13.0–17.0)
HEMOGLOBIN: 14 g/dL (ref 13.0–17.0)
MCH: 32 pg (ref 26.0–34.0)
MCH: 31.9 pg (ref 26.0–34.0)
MCHC: 33.2 g/dL (ref 30.0–36.0)
MCHC: 32.9 g/dL (ref 30.0–36.0)
MCV: 96.3 fL (ref 78.0–100.0)
MCV: 97 fL (ref 78.0–100.0)
Platelets: 146 10*3/uL — ABNORMAL LOW (ref 150–400)
Platelets: 170 10*3/uL (ref 150–400)
RBC: 4.1 MIL/uL — ABNORMAL LOW (ref 4.22–5.81)
RBC: 4.39 MIL/uL (ref 4.22–5.81)
RDW: 13.1 % (ref 11.5–15.5)
RDW: 13 % (ref 11.5–15.5)
WBC: 11.8 10*3/uL — ABNORMAL HIGH (ref 4.0–10.5)
WBC: 19.9 10*3/uL — ABNORMAL HIGH (ref 4.0–10.5)

## 2018-01-11 LAB — BASIC METABOLIC PANEL
Anion gap: 8 (ref 5–15)
BUN: 15 mg/dL (ref 6–20)
CHLORIDE: 106 mmol/L (ref 98–111)
CO2: 23 mmol/L (ref 22–32)
CREATININE: 0.81 mg/dL (ref 0.61–1.24)
Calcium: 7.7 mg/dL — ABNORMAL LOW (ref 8.9–10.3)
GFR calc non Af Amer: >60 mL/min (ref >60)
GLUCOSE: 126 mg/dL — AB (ref 70–99)
Potassium: 4.1 mmol/L (ref 3.5–5.1)
Sodium: 137 mmol/L (ref 135–145)

## 2018-01-11 LAB — POCT I-STAT, CHEM 8
BUN: 20 mg/dL (ref 6–20)
CALCIUM ION: 1.16 mmol/L (ref 1.15–1.40)
CHLORIDE: 102 mmol/L (ref 98–111)
CREATININE: 0.9 mg/dL (ref 0.61–1.24)
Glucose, Bld: 130 mg/dL — ABNORMAL HIGH (ref 70–99)
HCT: 42 % (ref 39.0–52.0)
Hemoglobin: 14.3 g/dL (ref 13.0–17.0)
Potassium: 4.5 mmol/L (ref 3.5–5.1)
Sodium: 137 mmol/L (ref 135–145)
TCO2: 23 mmol/L (ref 22–32)

## 2018-01-11 LAB — CREATININE, SERUM
Creatinine, Ser: 1.06 mg/dL (ref 0.61–1.24)
GFR calc Af Amer: >60 mL/min
GFR calc non Af Amer: >60 mL/min

## 2018-01-11 LAB — MAGNESIUM
Magnesium: 2.5 mg/dL — ABNORMAL HIGH (ref 1.7–2.4)
Magnesium: 2.5 mg/dL — ABNORMAL HIGH (ref 1.7–2.4)

## 2018-01-11 LAB — GLUCOSE, CAPILLARY
GLUCOSE-CAPILLARY: 126 mg/dL — AB (ref 70–99)
Glucose-Capillary: 109 mg/dL — ABNORMAL HIGH (ref 70–99)
Glucose-Capillary: 109 mg/dL — ABNORMAL HIGH (ref 70–99)
Glucose-Capillary: 144 mg/dL — ABNORMAL HIGH (ref 70–99)
Glucose-Capillary: 132 mg/dL — ABNORMAL HIGH (ref 70–99)

## 2018-01-11 IMAGING — DX DG CHEST 1V PORT
1 series · 1 of 1 positions shown · non-contrast
Comparison: [DATE].

CLINICAL DATA: Sore chest.  Chest tube.

EXAM:
PORTABLE CHEST 1 VIEW

[chest]
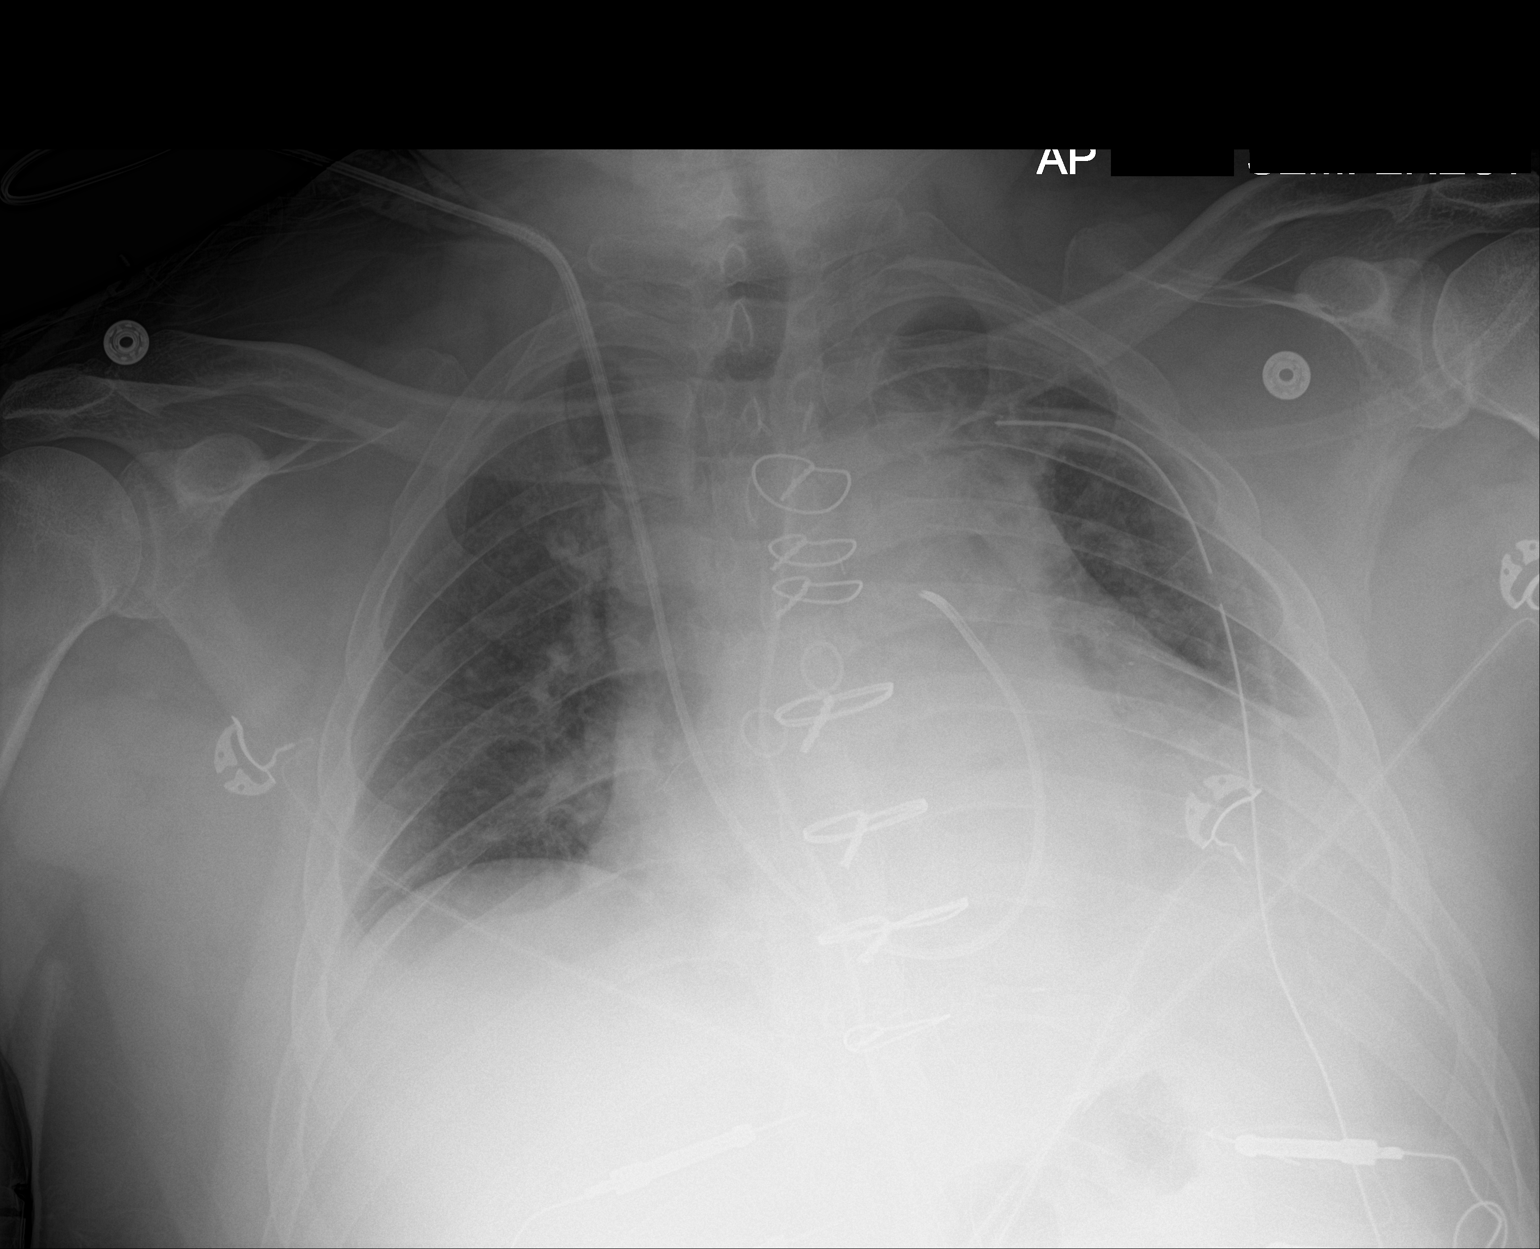

[1 of 1 positions shown; findings below may reference images not displayed]

FINDINGS: Interim extubation and removal of NG tube. Swan-Ganz catheter,
mediastinal drainage catheter, left chest tube in stable position.
No pneumothorax. Bibasilar atelectasis. Small bilateral pleural
effusions. Prior CABG. Cardiomegaly with mild pulmonary vascular
prominence. No acute bony abnormality.
IMPRESSION: 1. Interim extubation removal of NG tube. Swan-Ganz catheter,
mediastinal drainage catheter, left chest tube in stable position.

2.  Prior CABG.  Cardiomegaly.  Mild pulmonary venous congestion.

3. Low lung volumes with mild bibasilar atelectasis. Small bilateral
pleural effusions.

## 2018-01-11 MED ORDER — INSULIN DETEMIR 100 UNIT/ML ~~LOC~~ SOLN
10.0000 [IU] | Freq: Once | SUBCUTANEOUS | Status: AC
  Administered 2018-01-11: 10 [IU] via SUBCUTANEOUS
  Filled 2018-01-11: qty 0.1

## 2018-01-11 MED ORDER — FUROSEMIDE 10 MG/ML IJ SOLN
40.0000 mg | Freq: Two times a day (BID) | INTRAMUSCULAR | Status: AC
  Administered 2018-01-11 (×2): 40 mg via INTRAVENOUS
  Filled 2018-01-11: qty 4

## 2018-01-11 MED ORDER — INSULIN ASPART 100 UNIT/ML ~~LOC~~ SOLN
0.0000 [IU] | SUBCUTANEOUS | Status: DC
  Administered 2018-01-11 – 2018-01-12 (×5): 2 [IU] via SUBCUTANEOUS

## 2018-01-11 MED ORDER — INSULIN ASPART 100 UNIT/ML ~~LOC~~ SOLN
0.0000 [IU] | SUBCUTANEOUS | Status: DC
  Administered 2018-01-11: 2 [IU] via SUBCUTANEOUS

## 2018-01-11 MED ORDER — METOPROLOL TARTRATE 25 MG PO TABS
25.0000 mg | ORAL_TABLET | Freq: Two times a day (BID) | ORAL | Status: DC
  Administered 2018-01-11 – 2018-01-12 (×4): 25 mg via ORAL
  Filled 2018-01-11 (×4): qty 1

## 2018-01-11 MED ORDER — POTASSIUM CHLORIDE CRYS ER 20 MEQ PO TBCR
20.0000 meq | EXTENDED_RELEASE_TABLET | Freq: Two times a day (BID) | ORAL | Status: AC
  Administered 2018-01-11 (×2): 20 meq via ORAL
  Filled 2018-01-11 (×2): qty 1

## 2018-01-11 MED ORDER — INSULIN DETEMIR 100 UNIT/ML ~~LOC~~ SOLN
10.0000 [IU] | Freq: Every day | SUBCUTANEOUS | Status: DC
  Administered 2018-01-12: 10 [IU] via SUBCUTANEOUS
  Filled 2018-01-11 (×2): qty 0.1

## 2018-01-11 MED ORDER — METOPROLOL TARTRATE 25 MG/10 ML ORAL SUSPENSION: 25.0000 mg | Freq: Two times a day (BID) | ORAL | Status: DC

## 2018-01-11 MED ORDER — VITAMIN B-1 100 MG PO TABS
100.0000 mg | ORAL_TABLET | Freq: Every day | ORAL | Status: DC
  Administered 2018-01-11 – 2018-01-15 (×5): 100 mg via ORAL
  Filled 2018-01-11 (×5): qty 1

## 2018-01-11 MED FILL — Magnesium Sulfate Inj 50%: INTRAMUSCULAR | Qty: 10 | Status: AC

## 2018-01-11 MED FILL — Heparin Sodium (Porcine) Inj 1000 Unit/ML: INTRAMUSCULAR | Qty: 30 | Status: AC

## 2018-01-11 MED FILL — Potassium Chloride Inj 2 mEq/ML: INTRAVENOUS | Qty: 40 | Status: AC

## 2018-01-11 NOTE — Progress Notes (Addendum)
[facility] DAILY ICU PROGRESS NOTE                   [address]            [place] [address]          [phone]   1 Day Post-Op Procedure(s) (LRB): CORONARY ARTERY BYPASS GRAFTING (CABG) (N/A) TRANSESOPHAGEAL ECHOCARDIOGRAM (TEE) (N/A)  Total Length of Stay:  LOS: 2 days   Subjective: Patient with sternal, incisional pain this am.  Objective: Vital signs in last 24 hours: Temp:  [97.7 F (36.5 C)-99.9 F (37.7 C)] 99 F (37.2 C) (07/25 0700) Pulse Rate:  [66-101] 101 (07/25 0700) Cardiac Rhythm: Atrial paced (07/24 2000) Resp:  [11-29] 23 (07/25 0700) BP: (97-146)/(63-108) 146/108 (07/25 0700) SpO2:  [91 %-100 %] 93 % (07/25 0739) Arterial Line BP: (74-160)/(48-89) 128/74 (07/25 0700) FiO2 (%):  [40 %-100 %] 40 % (07/24 1719) Weight:  [227 lb 11.8 oz (103.3 kg)] 227 lb 11.8 oz (103.3 kg) (07/25 0500)  Filed Weights   01/10/18 0400 01/10/18 0533 01/11/18 0500  Weight: 218 lb 7.6 oz (99.1 kg) 218 lb 7.6 oz (99.1 kg) 227 lb 11.8 oz (103.3 kg)    Weight change: 9 lb 4.2 oz (4.2 kg)   Hemodynamic parameters for last 24 hours: PAP: (21-56)/(5-28) 29/12 CO:  [3.7 L/min-7.6 L/min] 7.6 L/min CI:  [1.7 L/min/m2-3.6 L/min/m2] 3.6 L/min/m2  Intake/Output from previous day: 07/24 0701 - 07/25 0700 In: 4970.3 [I.V.:3255.9; Blood:350; IV Piggyback:1364.4] Out: 4235 [Urine:3075; Blood:550; Chest Tube:610]  Intake/Output this shift: Total I/O In: 0.3 [I.V.:0.3] Out: 80 [Urine:40; Chest Tube:40]  Current Meds: Scheduled Meds: . acetaminophen  1,000 mg Oral Q6H   Or  . acetaminophen (TYLENOL) oral liquid 160 mg/5 mL  1,000 mg Per Tube Q6H  . aspirin EC  325 mg Oral Daily   Or  . aspirin  324 mg Per Tube Daily  . atorvastatin  80 mg Oral q1800  . bisacodyl  10 mg Oral Daily   Or  . bisacodyl  10 mg Rectal Daily  . docusate sodium  200 mg Oral Daily  . insulin aspart  0-24 Units Subcutaneous Q4H  . levalbuterol  0.63 mg Nebulization TID  .  metoCLOPramide (REGLAN) injection  10 mg Intravenous Q6H  . metoprolol tartrate  12.5 mg Oral BID   Or  . metoprolol tartrate  12.5 mg Per Tube BID  . [START ON 01/12/2018] pantoprazole  40 mg Oral Daily  . sodium chloride flush  3 mL Intravenous Q12H   Continuous Infusions: . sodium chloride 10 mL/hr at 01/11/18 0702  . sodium chloride    . sodium chloride 10 mL/hr at 01/10/18 1429  . albumin human 250 mL (01/10/18 1445)  . cefUROXime (ZINACEF)  IV 1.5 g (01/11/18 0804)  . dexmedetomidine (PRECEDEX) IV infusion Stopped (01/11/18 0521)  . famotidine (PEPCID) IV Stopped (01/10/18 1436)  . lactated ringers    . lactated ringers Stopped (01/10/18 1432)  . lactated ringers Stopped (01/11/18 0539)  . nitroGLYCERIN    . phenylephrine (NEO-SYNEPHRINE) Adult infusion Stopped (01/10/18 1833)   PRN Meds:.sodium chloride, albumin human, lactated ringers, metoprolol tartrate, midazolam, morphine injection, ondansetron (ZOFRAN) IV, oxyCODONE, sodium chloride flush, traMADol  General appearance: alert, cooperative and no distress Neurologic: intact Heart: RRR, rub with chest tubes in place Lungs: Diminished at bases Abdomen: Soft, non tender, sporadic bowel sounds Extremities: Bilateral LE edema Wound: Aquacel intact;Bilateral LE dressings dry  Lab Results: CBC: Recent Labs  01/10/18 2008 01/10/18 2010 01/11/18 0430  WBC 10.2  --  11.8*  HGB 12.3* 12.2* 13.1  HCT 36.4* 36.0* 39.5  PLT 160  --  146*   BMET:  Recent Labs    01/10/18 0036  01/10/18 2010 01/11/18 0430  NA 137   < > 139 137  K 3.4*   < > 4.3 4.1  CL 100   < > 105 106  CO2 23  --   --  23  GLUCOSE 117*   < > 107* 126*  BUN 25*   < > 21* 15  CREATININE 1.03   < > 0.60* 0.81  CALCIUM 9.3  --   --  7.7*   < > = values in this interval not displayed.    CMET: Lab Results  Component Value Date   WBC 11.8 (H) 01/11/2018   HGB 13.1 01/11/2018   HCT 39.5 01/11/2018   PLT 146 (L) 01/11/2018   GLUCOSE 126 (H)  01/11/2018   CHOL 318 (H) 01/09/2018   TRIG 265 (H) 01/09/2018   HDL 58 01/09/2018   LDLCALC 207 (H) 01/09/2018   ALT 103 (H) 01/09/2018   AST 44 (H) 01/09/2018   NA 137 01/11/2018   K 4.1 01/11/2018   CL 106 01/11/2018   CREATININE 0.81 01/11/2018   BUN 15 01/11/2018   CO2 23 01/11/2018   INR 1.24 01/10/2018   HGBA1C 5.7 (H) 01/09/2018      PT/INR:  Recent Labs    01/10/18 1355  LABPROT 15.5*  INR 1.24   Radiology: Dg Chest Port 1 View  Result Date: 01/11/2018 CLINICAL DATA:  Sore chest.  Chest tube. EXAM: PORTABLE CHEST 1 VIEW COMPARISON:  01/10/2018. FINDINGS: Interim extubation and removal of NG tube. Swan-Ganz catheter, mediastinal drainage catheter, left chest tube in stable position. No pneumothorax. Bibasilar atelectasis. Small bilateral pleural effusions. Prior CABG. Cardiomegaly with mild pulmonary vascular prominence. No acute bony abnormality. IMPRESSION: 1. Interim extubation removal of NG tube. Swan-Ganz catheter, mediastinal drainage catheter, left chest tube in stable position. 2.  Prior CABG.  Cardiomegaly.  Mild pulmonary venous congestion. 3. Low lung volumes with mild bibasilar atelectasis. Small bilateral pleural effusions. Electronically Signed   By: Maisie Fushomas  Register   On: 01/11/2018 06:29   Dg Chest Port 1 View  Result Date: 01/10/2018 CLINICAL DATA:  Status post CABG. EXAM: PORTABLE CHEST 1 VIEW COMPARISON:  01/08/2018. FINDINGS: ET tube, Swan-Ganz catheter, chest tube, mediastinal tube and nasogastric tube all in good position. Cardiomegaly. Mild vascular congestion. LEFT lower lobe subsegmental atelectasis. No pneumothorax. Epicardial pacing leads. IMPRESSION: Satisfactory postoperative CABG chest. Electronically Signed   By: Elsie StainJohn T Curnes M.D.   On: 01/10/2018 14:07     Assessment/Plan: S/P Procedure(s) (LRB): CORONARY ARTERY BYPASS GRAFTING (CABG) (N/A) TRANSESOPHAGEAL ECHOCARDIOGRAM (TEE) (N/A)  1. CV-Co/CI 5.1/2.4. On Lopressor 12.5 mg bid and  will increase to 25 mg bid for better BP and HR control.  2. Pulmonary-On 5 liters of oxygen via [place]-will wean over next few days. Chest tubes with 610 cc output since surgery. CXR this am shows patient is rotated to the right, low lung volumes, cardiomegaly, mild pulmonary venous congestion, and bibasilar atelectasis and pleural effusions.Per Dr. Tyrone SageGerhardt, remove mediastinal tube only.  Encourage incentive spirometer. 3. Volume overload-Will give Lasix 40 mg IV bid 4. CBGs 94/113/109. Will transition off Insulin drip. Pre op HGA1C 6.2. He has pre diabetes and will require further surveillance of HGA1C after discharge. 5. Mild thrombocytopenia-platelets 146,000 this am. 6.  Remove swan, a line-please see progression orders   Donielle Margaretann Loveless PA-C 01/11/2018 8:32 AM   bp elevated, respiratory status stable, work on pulmonary toilet Diurese I have seen and examined Rylend W Waters and agree with the above assessment  and plan.  Delight Ovens MD Beeper [phone] Office [phone] 01/11/2018 9:20 AM

## 2018-01-11 NOTE — Telephone Encounter (Signed)
Patient currently admitted at this time. 

## 2018-01-11 NOTE — Telephone Encounter (Signed)
[facility] initial consult Ar[facility]   Needs 2 wks fu 7/30   Scheduled 8/15Arida

## 2018-01-11 NOTE — Discharge Summary (Addendum)
Physician Discharge Summary       [address] [place].[address]       [place] [address]             [phone]    Patient ID: Edward Powell MRN: [id number] DOB/AGE: 1970-06-21 47 y.o.  Admit date: 01/09/2018 Discharge date: 01/15/2018  Admission Diagnoses: 1. Unstable angina 2. Coronary artery disease  Discharge Diagnoses:  1. S/P CABG x 4 2. ABL anemia 3. Brief runs NSVT 4. History of hypertension 5. History tobacco abuse 6. History of alcohol abuse   Procedure (s): LEFT HEART CATH AND CORONARY ANGIOGRAPHY by Dr. Okey Dupre on 01/09/2018:   Vessel is large.  Dist LM lesion 50% stenosed  Dist LM lesion is 50% stenosed. The lesion is eccentric.  Left Anterior Descending  Vessel is large.  Ost LAD lesion 60% stenosed  Ost LAD lesion is 60% stenosed. The lesion is eccentric.  Prox LAD to Mid LAD lesion 80% stenosed  Prox LAD to Mid LAD lesion is 80% stenosed. Focal napkin ring lesion at at takeoff of first major septal branch.  First Diagonal Branch  Vessel is small in size.  Second Diagonal Branch  Vessel is small in size.  Third Diagonal Branch  Vessel is small in size.  Ramus Intermedius  Vessel is moderate in size. Vessel is angiographically normal.  Left Circumflex  Vessel is moderate in size.  Prox Cx lesion 100% stenosed  Prox Cx lesion is 100% stenosed.  Second Obtuse Marginal Branch  Vessel is small in size.  Collaterals  2nd Mrg filled by collaterals from Dist LAD.    Right Coronary Artery  Vessel is moderate in size.  Prox RCA lesion 70% stenosed  Prox RCA lesion is 70% stenosed.  Mid RCA-1 lesion 40% stenosed  Mid RCA-1 lesion is 40% stenosed.  Mid RCA-2 lesion 90% stenosed  Mid RCA-2 lesion is 90% stenosed.  Dist RCA lesion 60% stenosed  Dist RCA lesion is 60% stenosed    Coronary artery bypass grafting x4 with the left internal mammary to the left anterior descending coronary artery, sequential reverse saphenous vein graft to the intermediate  and distal circumflex, reverse saphenous vein graft to the  posterior descending with left thigh endoscopic greater saphenous vein harvesting by Dr. Tyrone Sage on 01/10/2018.    History of Presenting Illness: Edward Powell is a 47 yo male with known history of HTN, Alcohol abuse (20+ oz of liquor per week) and nicotine abuse.  He presented to the ED with complaints of chest pain.  He described the pain as his chest being on fire.  This was associated with shortness of breath, nausea, diaphoresis, and light-headedness.  He admitted he has been feeling poorly over the past month and has been coughing up some yellow sputum.  He was told he may have had pneumonia and was treated with antibiotics.  Workup in the ED showed his troponin level to be 0.8.  He was treated with ASA and placed on a Heparin and NTG drip.  EKG did not exhibit significant changes.  He was hypertensive with SBP in the 190s.  He was admitted for further care and Cardiology consult was requested.  He was evaluated by Dr. Kirke Corin who ruled patient in for NSTEMI.  It was felt cardiac catheterization would be indicated and the patient was agreeable to proceed.  Catheterization was performed today by Dr. Okey Dupre and showed severe multivessel CAD.  It was felt coronary bypass grafting would be indicated and the patient  [facility].  Currently the patient is chest pain free.  He also denies shortness of breath.  He continues to cough but states that this has been improving.  He denies fevers, chills.  He admits to smoking 1 ppd per week.  He also admits to heavy drinking.  He works a Psychiatristhigh stress job and is concerned about returning and the effect it will have on his health.  He is nervous about having surgery.    Dr. Tyrone SageGerhardt reviewed with patient's history and examination, echocardiogram, chest x-ray, Doppler studies, and cardiac catheterization.  With the patient's acute presentation and significant three-vessel  coronary artery disease, Dr. Tyrone SageGerhardt agreed that coronary artery bypass grafting offers the best long-term solution for the patient risks and options were discussed with he and his family in detail. Potential risks, benefits, and complications of the surgery were discussed with the patient and he agreed to proceed with surgery. He underwent a CABG x 4 on 01/10/2018.   Brief Hospital Course:  The patient was extubated the evening of surgery without difficulty. He/she remained afebrile and hemodynamically stable. Theone MurdochSwan Ganz, a line, chest tubes, and foley were removed early in the post operative course. Lopressor was started and titrated accordingly. He was volume over loaded and diuresed. He had mild ABL anemia. He did not require a transfusion. His last H and H was 11.1 and 34.3. He had a history of alcohol abuse. He was given Thiamine and monitored for withdrawal symptpoms (which he did not have). He was weaned off the insulin drip.  The patient's HGA1C pre op was 6.2. 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  He will need further surveillance of his HGA1C as an outpatient. The patient was felt surgically stable for transfer from the ICU to [facility] for further convalescence on 01/13/2018. He continues to progress with cardiac rehab. He initially required several liters of oxygen via  but was later weaned to room air. He has been tolerating a diet and has had a bowel movement. Epicardial pacing wires have been removed. Nurse informed me patient had increased heart rate and brief run of SVT (apparently has had a few post op). He was also more hypertensive. I discussed with cardiology and Dr. Tyrone SageGerhardt. Coreg was increased to 25 mg bid and Lisinopril to 20 mg daily. Also, cardiology recommended decreasing ecasa to 81 mg daily and starting Plavix 75 mg daily, as patient had NSTEMI on admission. As discussed with Dr. Tyrone SageGerhardt, ok to start. Per cardiology and as discussed with Dr. Tyrone SageGerhardt, stable for discharge  today.  Latest Vital Signs: Blood pressure (!) 132/92, pulse 89, temperature 98.9 F (37.2 C), temperature source Oral, resp. rate 12, height 5\' 8"  (1.727 m), weight 221 lb 6.4 oz (100.4 kg), SpO2 96 %.  Physical Exam: Cardiovascular: RRR Pulmonary: Clear to auscultation bilaterally;  Abdomen: Soft, non tender, bowel sounds present. Extremities: Mild bilateral lower extremity edema. Wounds: Clean and dry.  No erythema or signs of infection.  Discharge Condition: Stable and discharged to home.  Recent laboratory studies:  Lab Results  Component Value Date   WBC 10.9 (H) 01/14/2018   HGB 11.1 (L) 01/14/2018   HCT 34.3 (L) 01/14/2018   MCV 98.3 01/14/2018   PLT 147 (L) 01/14/2018   Lab Results  Component Value Date   NA 137 01/15/2018   K 3.8 01/15/2018   CL 102 01/15/2018   CO2 26 01/15/2018   CREATININE 0.88 01/15/2018   GLUCOSE 124 (  H) 01/15/2018    Diagnostic Studies: Dg Chest 2 View  Result Date: 01/14/2018 CLINICAL DATA:  Follow-up atelectasis, chest pain and shortness of breath EXAM: CHEST - 2 VIEW COMPARISON:  01/13/2018 FINDINGS: Cardiac shadow remains enlarged. Postsurgical changes are again seen. Mild atelectatic changes are noted in the right perihilar region. Significant improved aeration in the bases is noted particularly on the left. No pneumothorax is seen. IMPRESSION: Mild residual right perihilar atelectatic changes. Overall improved aeration is noted. Electronically Signed   By: Alcide Clever M.D.   On: 01/14/2018 07:43    Discharge Instructions    Amb Referral to Cardiac Rehabilitation   Complete by:  As directed    Diagnosis:  CABG   CABG X ___:  4      Discharge Medications: Allergies as of 01/15/2018   No Known Allergies     Medication List    STOP taking these medications   amLODipine 5 MG tablet Commonly known as:  NORVASC   azithromycin 250 MG tablet Commonly known as:  ZITHROMAX   heparin 100-0.45 UNIT/ML-% infusion   losartan 25 MG  tablet Commonly known as:  COZAAR   morphine 2 MG/ML injection   nitroGLYCERIN 0.4 MG SL tablet Commonly known as:  NITROSTAT   ondansetron 4 MG tablet Commonly known as:  ZOFRAN     TAKE these medications   acetaminophen 325 MG tablet Commonly known as:  TYLENOL Take 2 tablets (650 mg total) by mouth every 6 (six) hours as needed for mild pain (or Fever >/= 101).   aspirin 81 MG EC tablet Take 1 tablet (81 mg total) by mouth daily.   atorvastatin 80 MG tablet Commonly known as:  LIPITOR Take 1 tablet (80 mg total) by mouth daily at 6 PM.   carvedilol 25 MG tablet Commonly known as:  COREG Take 1 tablet (25 mg total) by mouth 2 (two) times daily with a meal. What changed:    medication strength  how much to take   clopidogrel 75 MG tablet Commonly known as:  PLAVIX Take 1 tablet (75 mg total) by mouth daily.   furosemide 40 MG tablet Commonly known as:  LASIX Take 1 tablet (40 mg total) by mouth daily. For 5 days then stop.   ipratropium-albuterol 0.5-2.5 (3) MG/3ML Soln Commonly known as:  DUONEB Take 3 mLs by nebulization every 6 (six) hours.   lisinopril 20 MG tablet Commonly known as:  PRINIVIL,ZESTRIL Take 1 tablet (20 mg total) by mouth daily. Start taking on:  01/16/2018   oxyCODONE 5 MG immediate release tablet Commonly known as:  Oxy IR/ROXICODONE Take 5 mg by mouth every 4-6 hours PRN severe pain.   potassium chloride SA 20 MEQ tablet Commonly known as:  K-DUR,KLOR-CON Take 1 tablet (20 mEq total) by mouth daily. For 5 days then stop.      The patient has been discharged on:   1.Beta Blocker:  Yes [ x  ]                              No   [   ]                              If No, reason:  2.Ace Inhibitor/ARB: Yes [ x  ]  No  [    ]                                     If No, reason:  3.Statin:   Yes [  x ]                  No  [   ]                  If No, reason:  4.Ecasa:  Yes  [x   ]                   No   [   ]                  If No, reason:  Follow Up Appointments: Follow-up Information    Iran Ouch, MD Follow up on 02/01/2018.   Specialty:  Cardiology Why:  Please arrive 15 minutes early for your 3:40pm appointment Contact information: [address] [place] [address] [phone]        Delight Ovens, MD. Go on 02/20/2018.   Specialty:  Cardiothoracic Surgery Why:  PA/LAT CXR to be taken (at [facility] which is in the same building as Dr. Kenna Gilbert office) on 02/20/2018 a 12:30 pmt;Appointment time is at 1:00 pm Contact information: [address] [place] [address] [phone]        Medical doctor Follow up.   Why:  Please obtain a medicl doctor (if you do not have one) for further monitoring of pre op HGA1C 5.7 (pre diabetes)          Signed: Lelon Huh ZimmermanPA-C 01/15/2018, 12:38 PM

## 2018-01-11 NOTE — Progress Notes (Signed)
CTS  Patient examined and record reviewed.Hemodynamics stable,labs satisfactory.Patient had stable day.Continue current care. Kathlee Nationseter Van Trigt III 01/11/2018

## 2018-01-12 ENCOUNTER — Inpatient Hospital Stay (HOSPITAL_COMMUNITY): Payer: BLUE CROSS/BLUE SHIELD

## 2018-01-12 DIAGNOSIS — I200 Unstable angina: Secondary | ICD-10-CM

## 2018-01-12 LAB — CBC
HCT: 38.8 % — ABNORMAL LOW (ref 39.0–52.0)
HEMOGLOBIN: 12.6 g/dL — AB (ref 13.0–17.0)
MCH: 32 pg (ref 26.0–34.0)
MCHC: 32.5 g/dL (ref 30.0–36.0)
MCV: 98.5 fL (ref 78.0–100.0)
Platelets: 142 10*3/uL — ABNORMAL LOW (ref 150–400)
RBC: 3.94 MIL/uL — ABNORMAL LOW (ref 4.22–5.81)
RDW: 13.2 % (ref 11.5–15.5)
WBC: 19.2 10*3/uL — ABNORMAL HIGH (ref 4.0–10.5)

## 2018-01-12 LAB — BASIC METABOLIC PANEL
Anion gap: 9 (ref 5–15)
BUN: 17 mg/dL (ref 6–20)
CO2: 26 mmol/L (ref 22–32)
CREATININE: 0.99 mg/dL (ref 0.61–1.24)
Calcium: 8.5 mg/dL — ABNORMAL LOW (ref 8.9–10.3)
Chloride: 101 mmol/L (ref 98–111)
GFR calc Af Amer: >60 mL/min (ref >60)
GFR calc non Af Amer: >60 mL/min (ref >60)
GLUCOSE: 118 mg/dL — AB (ref 70–99)
Potassium: 4.9 mmol/L (ref 3.5–5.1)
Sodium: 136 mmol/L (ref 135–145)

## 2018-01-12 LAB — GLUCOSE, CAPILLARY
GLUCOSE-CAPILLARY: 115 mg/dL — AB (ref 70–99)
GLUCOSE-CAPILLARY: 119 mg/dL — AB (ref 70–99)
GLUCOSE-CAPILLARY: 98 mg/dL (ref 70–99)
Glucose-Capillary: 120 mg/dL — ABNORMAL HIGH (ref 70–99)
Glucose-Capillary: 108 mg/dL — ABNORMAL HIGH (ref 70–99)
Glucose-Capillary: 118 mg/dL — ABNORMAL HIGH (ref 70–99)
Glucose-Capillary: 125 mg/dL — ABNORMAL HIGH (ref 70–99)

## 2018-01-12 IMAGING — DX DG CHEST 1V PORT
1 series · 1 of 1 positions shown · non-contrast
Comparison: [DATE]

CLINICAL DATA: Follow-up left chest tube

EXAM:
PORTABLE CHEST 1 VIEW

[chest]
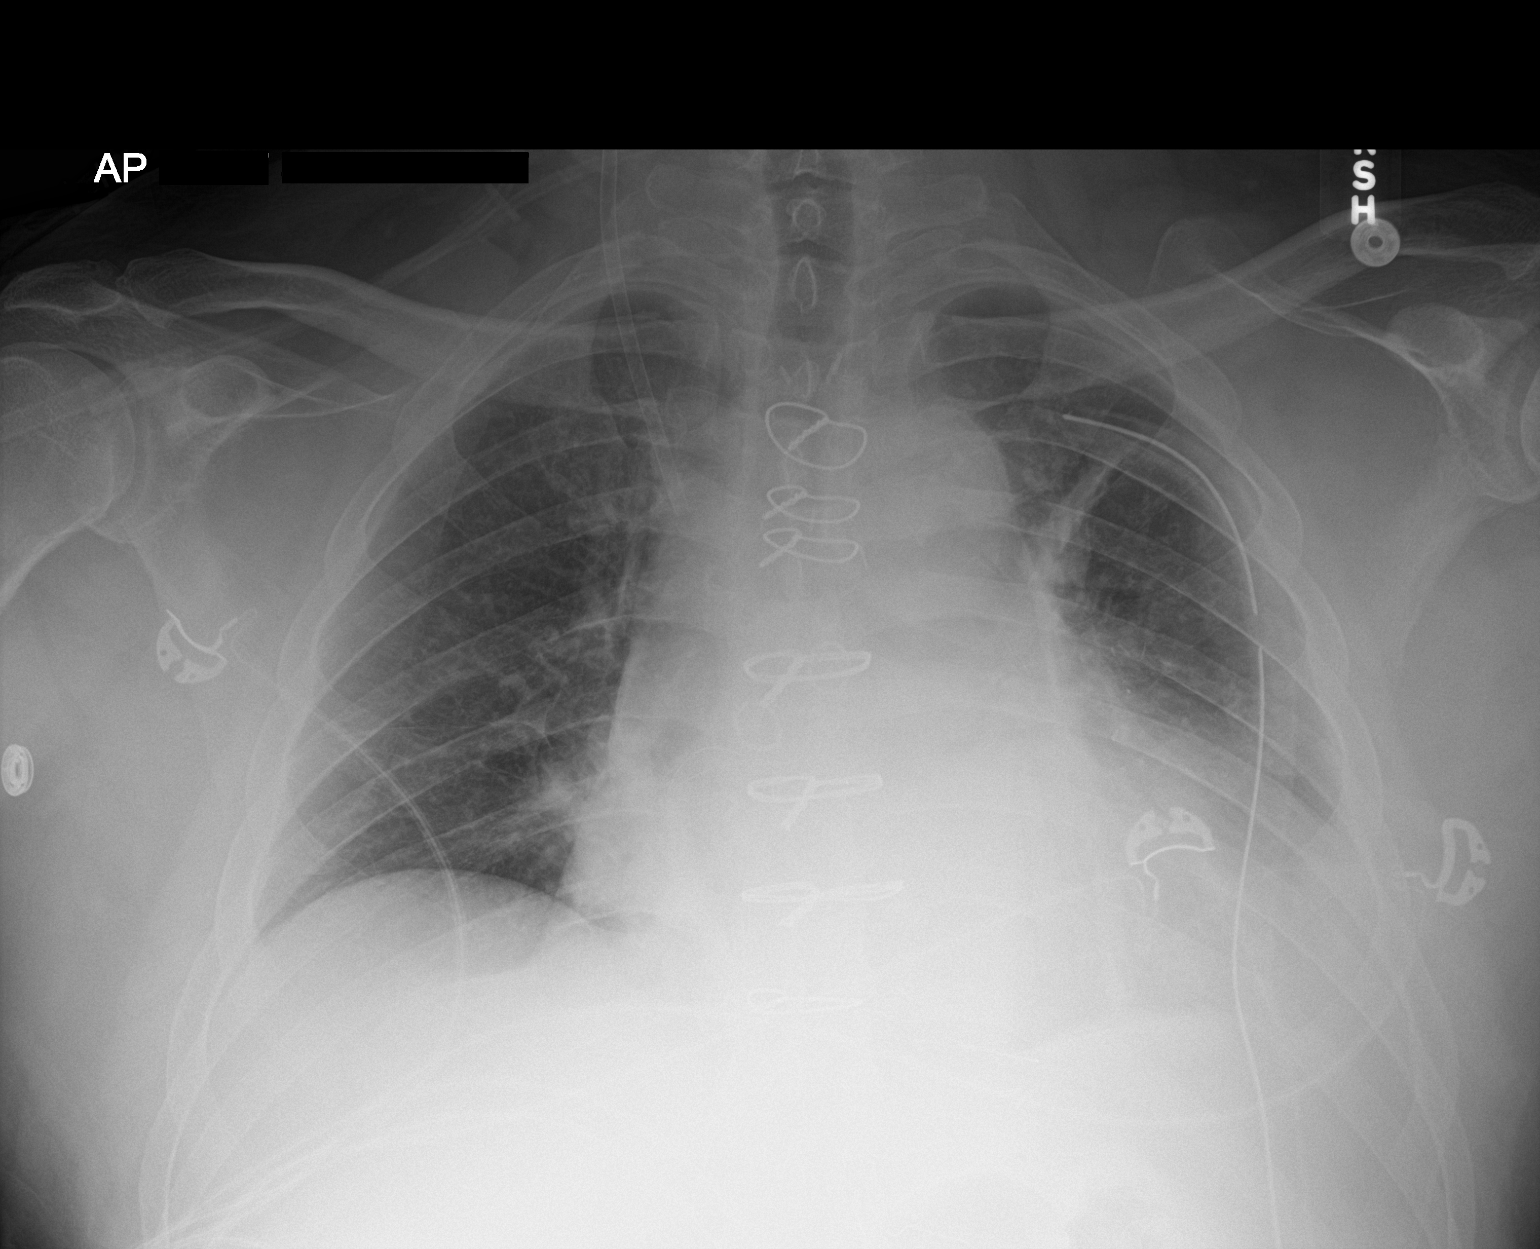

[1 of 1 positions shown; findings below may reference images not displayed]

FINDINGS: Swan-Ganz catheter has been removed as has a mediastinal drain. Left
thoracostomy catheter remains in place. No pneumothorax is seen.
Mild left basilar atelectasis is noted. The right lung is clear.
IMPRESSION: No pneumothorax is noted.

Mild left basilar atelectasis is noted.

## 2018-01-12 MED ORDER — LEVALBUTEROL HCL 0.63 MG/3ML IN NEBU
0.6300 mg | INHALATION_SOLUTION | Freq: Two times a day (BID) | RESPIRATORY_TRACT | Status: DC
  Administered 2018-01-13: 0.63 mg via RESPIRATORY_TRACT
  Filled 2018-01-12 (×2): qty 3

## 2018-01-12 MED ORDER — LEVALBUTEROL HCL 0.63 MG/3ML IN NEBU: 0.6300 mg | INHALATION_SOLUTION | Freq: Four times a day (QID) | RESPIRATORY_TRACT | Status: DC | PRN

## 2018-01-12 MED ORDER — LISINOPRIL 5 MG PO TABS
5.0000 mg | ORAL_TABLET | Freq: Every day | ORAL | Status: DC
  Administered 2018-01-12 – 2018-01-14 (×3): 5 mg via ORAL
  Filled 2018-01-12 (×3): qty 1

## 2018-01-12 MED ORDER — POTASSIUM CHLORIDE CRYS ER 20 MEQ PO TBCR
20.0000 meq | EXTENDED_RELEASE_TABLET | Freq: Once | ORAL | Status: AC
  Administered 2018-01-12: 20 meq via ORAL
  Filled 2018-01-12: qty 1

## 2018-01-12 MED ORDER — FUROSEMIDE 10 MG/ML IJ SOLN
80.0000 mg | Freq: Two times a day (BID) | INTRAMUSCULAR | Status: AC
  Administered 2018-01-12 (×2): 80 mg via INTRAVENOUS
  Filled 2018-01-12 (×2): qty 8

## 2018-01-12 NOTE — Progress Notes (Signed)
      [address]       [place] [id number]             [phone]      POD # 2 CABG  Resting comfortably at present  BP 109/77   Pulse 74   Temp 98.4 F (36.9 C) (Oral)   Resp 18   Ht 5\' 8"  (1.727 m)   Wt 226 lb 4.8 oz (102.6 kg)   SpO2 96%   BMI 34.41 kg/m   Intake/Output Summary (Last 24 hours) at 01/12/2018 1658 Last data filed at 01/12/2018 1200 Gross per 24 hour  Intake 950.98 ml  Output 1239 ml  Net -288.02 ml   CBG well controlled  Doing well, continue diuresis  Viviann SpareSteven C. Dorris FetchHendrickson, MD [facility] [phone]

## 2018-01-12 NOTE — Telephone Encounter (Signed)
Currently admitted at this time. 

## 2018-01-12 NOTE — Care Management Note (Signed)
Case Management Note Donn PieriniKristi Alyssandra Hulsebus RN,BSN Unit [facility] 1[facility]22 Case Manager  [phone]  Patient Details  Name: Edward SpeckingDarin W Powell MRN: [id number] Date of Birth: Sep 22, 1970  Subjective/Objective:  Pt admitted s/p CABG                  Action/Plan: PTA pt lived at home, anticipate return home, CM to follow for transition of care needs  Expected Discharge Date:                  Expected Discharge Plan:  Home/Self Care  In-House Referral:     Discharge planning Services  CM Consult  Post Acute Care Choice:    Choice offered to:     DME Arranged:    DME Agency:     HH Arranged:    HH Agency:     Status of Service:  In process, will continue to follow  If discussed at Long Length of Stay Meetings, dates discussed:    Discharge Disposition:   Additional Comments:  Darrold SpanWebster, Lane Eland Hall, RN 01/12/2018, 2:54 PM

## 2018-01-12 NOTE — Op Note (Signed)
NAMIgnatius Powell: Sharman, Edward W. MEDICAL RECORD [id number] ACCOUNT [id number] DATE OF BIRTH:01-17-1971 FACILITY: [facility] LOCATION: [facility] PHYSICIAN:Jacobie Stamey Bari EdwardB. Talana Slatten, MD  OPERATIVE REPORT  DATE OF PROCEDURE:  01/10/2018  PREOPERATIVE DIAGNOSIS:  Three-vessel coronary artery disease with recent admission for unstable angina.  POSTOPERATIVE DIAGNOSIS:  Three-vessel coronary artery disease with recent admission for unstable angina.  SURGICAL PROCEDURE:  Coronary artery bypass grafting x4 with the left internal mammary to the left anterior descending coronary artery, sequential reverse saphenous vein graft to the intermediate and distal circumflex, reverse saphenous vein graft to the  posterior descending with left thigh endoscopic greater saphenous vein harvesting.  SURGEON:   Sheliah PlaneEdward Deannah Rossi, MD  FIRST ASSISTANT:  Lowella DandyErin Barrett, PA  BRIEF HISTORY:  The patient is a 47 year old male with no previous history of known coronary artery disease.  I presented the day prior to surgery with prolonged episode of chest pain with diaphoresis.  Was [facility] in [facility] and underwent cardiac  catheterization by Dr. Okey DupreEnd, which demonstrated significant 3-vessel coronary artery disease including at least a 50% left main.  He had diffuse disease throughout the right coronary artery, a relatively small circumflex system with the distal circumflex  branch totally occluded and complex disease of greater than 80% in the LAD.  Because of the patient's significant 3-vessel coronary artery disease, coronary artery bypass grafting was recommended.  Overall, ventricular function was preserved though the  patient did have elevated end diastolic pressures at the time of catheterization.  Risks and options were discussed with the patient in detail and he was agreeable and signed informed consent.  DESCRIPTION OF PROCEDURE:  With Swan-Ganz and arterial line monitors in place, the patient underwent general endotracheal  anesthesia without incident.  Skin the chest and legs was prepped with Betadine, draped in the usual sterile manner.  TEE probe was  placed by Anesthesia demonstrating preserved LV function and without valvular abnormalities.  Appropriate timeout was performed.  We then proceeded with a small incision at the right knee.  The vein at the right knee looked relatively small and  superficial.  An incision was made at the left knee area and the left greater saphenous vein was endoscopically harvested and was of good quality and caliber.  Median sternotomy was performed.  The left internal mammary artery was dissected down as a  pedicle graft.  The distal artery was divided and had good free flow.  Pericardium was opened.  Overall, ventricular function appeared preserved.  The patient was systemically heparinized.  The ascending aorta was cannulated.  The right atrium was  cannulated.  An aortic root vent cardioplegia needle was introduced into the ascending aorta.  The patient was placed on cardiopulmonary bypass at 2.4 liters per minute per meter square.  Sites and anastomoses were selected and dissected out of the  epicardium.  It was noted that the patient's circumflex system was relatively small.  There was an intermediate branch and then a small circumflex branch, which appeared stumped off on the preoperative films.  The distal vessel, not apparent on the  films, appeared adequate for bypass and was dissected out.  The distal right coronary artery was diffusely diseased.  The proximal posterior descending was adequate for bypass.  The patient's body temperature was cooled to 32 degrees.  Aortic crossclamp  was applied; 500 cc of cold blood potassium cardioplegia was administered with diastolic arrest of the heart.  Myocardial septal temperatures were monitored throughout the crossclamp.  Attention was turned first to the posterior descending  coronary  artery, which was opened and admitted a 1 mm probe  distally.  Using a running 7-0 Prolene, distal anastomosis was performed.  The heart was then elevated.  The intermediate vessel was opened and was relatively small.  It did admit a 1 mm probe distally.   Using diamond type side-to-side anastomosis with a running  8-0 Prolene, distal anastomosis was performed.  The distal extent of the same vein was then carried around to the distal circumflex branch which was approximately 1.2 mm in size.  Using a running 8-0 Prolene, distal anastomosis was performed.  Additional  cold blood cardioplegia was administered down the vein grafts.  Attention was then turned to the left anterior descending coronary artery:  The vessel was opened and the midportion.  A 1.5 mm probe passed distally using a running 8-0 Prolene, distal  anastomosis was performed with the Vangorden clamp still in place 2 punch aortotomies were performed and each of the 2 vein grafts were anastomosed to the ascending aorta.  Bulldog was removed from the mammary artery with prompt rise in myocardial septal  temperature the heart was allowed to passively fill and deair the proximal anastomoses were completed and the Kowalczyk clamp was removed with total crossclamp time of 103 minutes.  Sites of anastomoses were inspected and were free of bleeding.  The patient  was then rewarmed to 37 degrees.  He spontaneously converted to a sinus rhythm.  Sites and anastomoses were inspected and free of bleeding.  He was then ventilated and weaned from cardiopulmonary bypass without difficulty.  He remained hemodynamically  stable.  He was decannulated in the usual fashion.  Protamine sulfate was administered.  With the operative field hemostatic, atrial and ventricular pacing wires were applied.  Graft markers were applied.  A left pleural tube and Blake mediastinal drains  were left in place.  Pericardium was loosely reapproximated.  Sternum was closed with #6 stainless steel wire.  Fascia was closed with interrupted 0  Vicryl, running 3-0 Vicryl in subcutaneous tissue, 4-0 subcuticular stitch in skin edges.  Additional  dry dressings were applied.    Sponge and needle count was reported as correct at completion of the procedure.    The patient tolerated the procedure without obvious complication and was transferred to the Surgical Intensive Care Unit for further postoperative care.  The patient did not require any blood bank blood products during the operative procedure.    Total pump time was 136 minutes.    A PA was used as the first assistant during the procedure to assist with vein harvesting, cannulation, decannulation and construction of anastomoses.  AN/NUANCE  D:01/12/2018 T:01/12/2018 JOB:[id number]

## 2018-01-12 NOTE — Discharge Instructions (Signed)
° ° °Prediabetes °Prediabetes is the condition of having a blood sugar (blood glucose) level that is higher than it should be, but not high enough for you to be diagnosed with type 2 diabetes. Having prediabetes puts you at risk for developing type 2 diabetes (type 2 diabetes mellitus). Prediabetes may be called impaired glucose tolerance or impaired fasting glucose. °Prediabetes usually does not cause symptoms. Your health care provider can diagnose this condition with blood tests. You may be tested for prediabetes if you are overweight and if you have at least one other risk factor for prediabetes. °Risk factors for prediabetes include: °· Having a family member with type 2 diabetes. °· Being overweight or obese. °· Being older than age 45. °· Being of American-Indian, African-American, Hispanic/Latino, or Asian/Pacific Islander descent. °· Having an inactive (sedentary) lifestyle. °· Having a history of gestational diabetes or polycystic ovarian syndrome (PCOS). °· Having low levels of good cholesterol (HDL-C) or high levels of blood fats (triglycerides). °· Having high blood pressure. ° °What is blood glucose and how is blood glucose measured? ° °Blood glucose refers to the amount of glucose in your bloodstream. Glucose comes from eating foods that contain sugars and starches (carbohydrates) that the body breaks down into glucose. Your blood glucose level may be measured in mg/dL (milligrams per deciliter) or mmol/L (millimoles per liter). Your blood glucose may be checked with one or more of the following blood tests: °· A fasting blood glucose (FBG) test. You will not be allowed to eat (you will fast) for at least 8 hours before a blood sample is taken. °? A normal range for FBG is 70-100 mg/dl (3.9-5.6 mmol/L). °· An A1c (hemoglobin A1c) blood test. This test provides information about blood glucose control over the previous 2?3 months. °· An oral glucose tolerance test (OGTT). This test measures your blood  glucose twice: °? After fasting. This is your baseline level. °? Two hours after you drink a beverage that contains glucose. ° °You may be diagnosed with prediabetes: °· If your FBG is 100?125 mg/dL (5.6-6.9 mmol/L). °· If your A1c level is 5.7?6.4%. °· If your OGGT result is 140?199 mg/dL (7.8-11 mmol/L). ° °These blood tests may be repeated to confirm your diagnosis. °What happens if blood glucose is too high? °The pancreas produces a hormone (insulin) that helps move glucose from the bloodstream into cells. When cells in the body do not respond properly to insulin that the body makes (insulin resistance), excess glucose builds up in the blood instead of going into cells. As a result, high blood glucose (hyperglycemia) can develop, which can cause many complications. This is a symptom of prediabetes. °What can happen if blood glucose stays higher than normal for a long time? °Having high blood glucose for a long time is dangerous. Too much glucose in your blood can damage your nerves and blood vessels. Long-term damage can lead to complications from diabetes, which may include: °· Heart disease. °· Stroke. °· Blindness. °· Kidney disease. °· Depression. °· Poor circulation in the feet and legs, which could lead to surgical removal (amputation) in severe cases. ° °How can prediabetes be prevented from turning into type 2 diabetes? ° °To help prevent type 2 diabetes, take the following actions: °· Be physically active. °? Do moderate-intensity physical activity for at least 30 minutes on at least 5 days of the week, or as much as told by your health care provider. This could be brisk walking, biking, or water aerobics. °? Ask your health care   provider what activities are safe for you. A mix of physical activities may be best, such as walking, swimming, cycling, and strength training. °· Lose weight as told by your health care provider. °? Losing 5-7% of your body weight can reverse insulin resistance. °? Your health  care provider can determine how much weight loss is best for you and can help you lose weight safely. °· Follow a healthy meal plan. This includes eating lean proteins, complex carbohydrates, fresh fruits and vegetables, low-fat dairy products, and healthy fats. °? Follow instructions from your health care provider about eating or drinking restrictions. °? Make an appointment to see a diet and nutrition specialist (registered dietitian) to help you create a healthy eating plan that is right for you. °· Do not smoke or use any tobacco products, such as cigarettes, chewing tobacco, and e-cigarettes. If you need help quitting, ask your health care provider. °· Take over-the-counter and prescription medicines as told by your health care provider. You may be prescribed medicines that help lower the risk of type 2 diabetes. ° °This information is not intended to replace advice given to you by your health care provider. Make sure you discuss any questions you have with your health care provider. °Document Released: 09/28/2015 Document Revised: 11/12/2015 Document Reviewed: 07/28/2015 °Elsevier Interactive Patient Education © 2018 [employer] °Coronary Artery Bypass Grafting, Care After °These instructions give you information on caring for yourself after your procedure. Your doctor may also give you more specific instructions. Call your doctor if you have any problems or questions after your procedure. °Follow these instructions at home: °· Only take medicine as told by your doctor. Take medicines exactly as told. Do not stop taking medicines or start any new medicines without talking to your doctor first. °· Take your pulse as told by your doctor. °· Do deep breathing as told by your doctor. Use your breathing device (incentive spirometer), if given, to practice deep breathing several times a day. Support your chest with a pillow or your arms when you take deep breaths or cough. °· Keep the area clean, dry, and  protected where the surgery cuts (incisions) were made. Remove bandages (dressings) only as told by your doctor. If strips were applied to surgical area, do not take them off. They fall off on their own. °· Check the surgery area daily for puffiness (swelling), redness, or leaking fluid. °· If surgery cuts were made in your legs: °? Avoid crossing your legs. °? Avoid sitting for long periods of time. Change positions every 30 minutes. °? Raise your legs when you are sitting. Place them on pillows. °· Wear stockings that help keep blood clots from forming in your legs (compression stockings). °· Only take sponge baths until your doctor says it is okay to take showers. Pat the surgery area dry. Do not rub the surgery area with a washcloth or towel. Do not bathe, swim, or use a hot tub until your doctor says it is okay. °· Eat foods that are high in fiber. These include raw fruits and vegetables, whole grains, beans, and nuts. Choose lean meats. Avoid canned, processed, and fried foods. °· Drink enough fluids to keep your pee (urine) clear or pale yellow. °· Weigh yourself every day. °· Rest and limit activity as told by your doctor. You may be told to: °? Stop any activity if you have chest pain, shortness of breath, changes in heartbeat, or dizziness. Get help right away if this happens. °? Move around often   for short amounts of time or take short walks as told by your doctor. Gradually become more active. You may need help to strengthen your muscles and build endurance. °? Avoid lifting, pushing, or pulling anything heavier than 10 pounds (4.5 kg) for at least 6 weeks after surgery. °· Do not drive until your doctor says it is okay. °· Ask your doctor when you can go back to work. °· Ask your doctor when you can begin sexual activity again. °· Follow up with your doctor as told. °Contact a doctor if: °· You have puffiness, redness, more pain, or fluid draining from the incision site. °· You have a fever. °· You have  puffiness in your ankles or legs. °· You have pain in your legs. °· You gain 2 or more pounds (0.9 kg) a day. °· You feel sick to your stomach (nauseous) or throw up (vomit). °· You have watery poop (diarrhea). °Get help right away if: °· You have chest pain that goes to your jaw or arms. °· You have shortness of breath. °· You have a fast or irregular heartbeat. °· You notice a "clicking" in your breastbone when you move. °· You have numbness or weakness in your arms or legs. °· You feel dizzy or light-headed. °This information is not intended to replace advice given to you by your health care provider. Make sure you discuss any questions you have with your health care provider. °Document Released: 06/11/2013 Document Revised: 11/12/2015 Document Reviewed: 11/13/2012 °Elsevier Interactive Patient Education © 2017 [employer] ° °

## 2018-01-12 NOTE — Progress Notes (Addendum)
[facility] DAILY ICU PROGRESS NOTE                   [address]            [place] [address]          [phone]   2 Days Post-Op Procedure(s) (LRB): CORONARY ARTERY BYPASS GRAFTING (CABG) (N/A) TRANSESOPHAGEAL ECHOCARDIOGRAM (TEE) (N/A)  Total Length of Stay:  LOS: 3 days   Subjective: Patient sitting in chair this am. He has sternal incisional pain and pain at left chest tube.  Objective: Vital signs in last 24 hours: Temp:  [97.8 F (36.6 C)-99 F (37.2 C)] 98.1 F (36.7 C) (07/26 0727) Pulse Rate:  [65-106] 82 (07/26 0700) Cardiac Rhythm: Normal sinus rhythm (07/25 2000) Resp:  [9-29] 21 (07/26 0700) BP: (112-158)/(73-120) 138/84 (07/26 0700) SpO2:  [87 %-95 %] 95 % (07/26 0700) Arterial Line BP: (114-152)/(69-99) 152/99 (07/25 1400) Weight:  [226 lb 4.8 oz (102.6 kg)] 226 lb 4.8 oz (102.6 kg) (07/26 0500)  Filed Weights   01/10/18 0533 01/11/18 0500 01/12/18 0500  Weight: 218 lb 7.6 oz (99.1 kg) 227 lb 11.8 oz (103.3 kg) 226 lb 4.8 oz (102.6 kg)    Weight change: -1 lb 7 oz (-0.651 kg)   Hemodynamic parameters for last 24 hours: PAP: (31)/(17) 31/17  Intake/Output from previous day: 07/25 0701 - 07/26 0700 In: 1409.4 [P.O.:1000; I.V.:209.4; IV Piggyback:200] Out: 1475 [Urine:1225; Chest Tube:250]  Intake/Output this shift: No intake/output data recorded.  Current Meds: Scheduled Meds: . acetaminophen  1,000 mg Oral Q6H   Or  . acetaminophen (TYLENOL) oral liquid 160 mg/5 mL  1,000 mg Per Tube Q6H  . aspirin EC  325 mg Oral Daily   Or  . aspirin  324 mg Per Tube Daily  . atorvastatin  80 mg Oral q1800  . bisacodyl  10 mg Oral Daily   Or  . bisacodyl  10 mg Rectal Daily  . docusate sodium  200 mg Oral Daily  . insulin aspart  0-24 Units Subcutaneous Q4H  . insulin detemir  10 Units Subcutaneous Daily  . levalbuterol  0.63 mg Nebulization TID  . metoCLOPramide (REGLAN) injection  10 mg Intravenous Q6H  . metoprolol tartrate  25 mg  Oral BID   Or  . metoprolol tartrate  25 mg Per Tube BID  . pantoprazole  40 mg Oral Daily  . sodium chloride flush  3 mL Intravenous Q12H  . thiamine  100 mg Oral Daily   Continuous Infusions: . sodium chloride Stopped (01/11/18 0903)  . sodium chloride    . sodium chloride 10 mL/hr at 01/10/18 1429  . cefUROXime (ZINACEF)  IV Stopped (01/11/18 2038)  . dexmedetomidine (PRECEDEX) IV infusion Stopped (01/11/18 0521)  . lactated ringers    . lactated ringers Stopped (01/10/18 1432)  . lactated ringers Stopped (01/12/18 0317)  . nitroGLYCERIN    . phenylephrine (NEO-SYNEPHRINE) Adult infusion Stopped (01/10/18 1833)   PRN Meds:.sodium chloride, lactated ringers, metoprolol tartrate, midazolam, morphine injection, ondansetron (ZOFRAN) IV, oxyCODONE, sodium chloride flush, traMADol  General appearance: alert, cooperative and no distress Neurologic: intact Heart: RRR Lungs: Diminished at bases Abdomen: Soft, non tender, sporadic bowel sounds Extremities: Bilateral LE edema Wound: Aquacel intact;Bilateral LE dressings dry  Lab Results: CBC: Recent Labs    01/11/18 1622 01/11/18 1630 01/12/18 0422  WBC 19.9*  --  19.2*  HGB 14.0 14.3 12.6*  HCT 42.6 42.0 38.8*  PLT 170  --  142*  BMET:  Recent Labs    01/11/18 0430  01/11/18 1630 01/12/18 0422  NA 137  --  137 136  K 4.1  --  4.5 4.9  CL 106  --  102 101  CO2 23  --   --  26  GLUCOSE 126*  --  130* 118*  BUN 15  --  20 17  CREATININE 0.81   < > 0.90 0.99  CALCIUM 7.7*  --   --  8.5*   < > = values in this interval not displayed.    CMET: Lab Results  Component Value Date   WBC 19.2 (H) 01/12/2018   HGB 12.6 (L) 01/12/2018   HCT 38.8 (L) 01/12/2018   PLT 142 (L) 01/12/2018   GLUCOSE 118 (H) 01/12/2018   CHOL 318 (H) 01/09/2018   TRIG 265 (H) 01/09/2018   HDL 58 01/09/2018   LDLCALC 207 (H) 01/09/2018   ALT 103 (H) 01/09/2018   AST 44 (H) 01/09/2018   NA 136 01/12/2018   K 4.9 01/12/2018   CL 101  01/12/2018   CREATININE 0.99 01/12/2018   BUN 17 01/12/2018   CO2 26 01/12/2018   INR 1.24 01/10/2018   HGBA1C 5.7 (H) 01/09/2018      PT/INR:  Recent Labs    01/10/18 1355  LABPROT 15.5*  INR 1.24   Radiology: Dg Chest Port 1 View  Result Date: 01/12/2018 CLINICAL DATA:  Follow-up left chest tube EXAM: PORTABLE CHEST 1 VIEW COMPARISON:  01/11/2018 FINDINGS: Swan-Ganz catheter has been removed as has a mediastinal drain. Left thoracostomy catheter remains in place. No pneumothorax is seen. Mild left basilar atelectasis is noted. The right lung is clear. IMPRESSION: No pneumothorax is noted. Mild left basilar atelectasis is noted. Electronically Signed   By: Alcide CleverMark  Powell M.D.   On: 01/12/2018 07:22     Assessment/Plan: S/P Procedure(s) (LRB): CORONARY ARTERY BYPASS GRAFTING (CABG) (N/A) TRANSESOPHAGEAL ECHOCARDIOGRAM (TEE) (N/A)  1. CV-On Lopressor 25 mg bid. He is hypertensive this am so will start Lisinopril.  2. Pulmonary-On 4 liters of oxygen via [place]-will wean over next few days. Chest tubes with 250 cc output last 24 horus. CXR this am shows  low lung volumes, cardiomegaly, , and bibasilar atelectasis L>R, and pleural effusions. Remove remaining chest tube.  Encourage incentive spirometer. 3. Volume overload-Will give Lasix 80 mg IV bid again today as discussed with Dr. Tyrone SageGerhardt 4. CBGs 132/120/108. On scheduled Insulin. Pre op HGA1C 6.2. He has pre diabetes and will require further surveillance of HGA1C after discharge. 5. Mild thrombocytopenia-platelets 142,000 this am. 6. History of etoh abuse-on thiamine 7. Mild ABL anemia-H and H slightly decreased to 12.6 and 38.8 8. Remove foley 9. Potassium 4.9 so will only give lose dose potassium supplement with increased IV bid Lasix doses today  Edward Margaretann LovelessM Zimmerman PA-C 01/12/2018 8:04 AM   bp still up, add ace Continue diuresis, lved elevated at time of cath I have seen and examined Edward Powell and agree with the above  assessment  and plan.  Delight OvensEdward B Arihana Ambrocio MD [facility] [phone] Office [phone] 01/12/2018 8:29 AM

## 2018-01-13 ENCOUNTER — Inpatient Hospital Stay (HOSPITAL_COMMUNITY): Payer: BLUE CROSS/BLUE SHIELD

## 2018-01-13 LAB — GLUCOSE, CAPILLARY
GLUCOSE-CAPILLARY: 103 mg/dL — AB (ref 70–99)
GLUCOSE-CAPILLARY: 103 mg/dL — AB (ref 70–99)
GLUCOSE-CAPILLARY: 109 mg/dL — AB (ref 70–99)
Glucose-Capillary: 123 mg/dL — ABNORMAL HIGH (ref 70–99)
Glucose-Capillary: 109 mg/dL — ABNORMAL HIGH (ref 70–99)

## 2018-01-13 LAB — CBC
HCT: 35 % — ABNORMAL LOW (ref 39.0–52.0)
Hemoglobin: 11.6 g/dL — ABNORMAL LOW (ref 13.0–17.0)
MCH: 32.3 pg (ref 26.0–34.0)
MCHC: 33.1 g/dL (ref 30.0–36.0)
MCV: 97.5 fL (ref 78.0–100.0)
PLATELETS: 139 10*3/uL — AB (ref 150–400)
RBC: 3.59 MIL/uL — AB (ref 4.22–5.81)
RDW: 13.2 % (ref 11.5–15.5)
WBC: 14.7 10*3/uL — ABNORMAL HIGH (ref 4.0–10.5)

## 2018-01-13 LAB — BASIC METABOLIC PANEL
ANION GAP: 8 (ref 5–15)
BUN: 21 mg/dL — ABNORMAL HIGH (ref 6–20)
CALCIUM: 8.4 mg/dL — AB (ref 8.9–10.3)
CO2: 28 mmol/L (ref 22–32)
Chloride: 100 mmol/L (ref 98–111)
Creatinine, Ser: 1.07 mg/dL (ref 0.61–1.24)
GFR calc Af Amer: >60 mL/min (ref >60)
Glucose, Bld: 111 mg/dL — ABNORMAL HIGH (ref 70–99)
POTASSIUM: 3.8 mmol/L (ref 3.5–5.1)
SODIUM: 136 mmol/L (ref 135–145)

## 2018-01-13 IMAGING — DX DG CHEST 1V PORT
1 series · 1 of 1 positions shown · non-contrast
Comparison: One-view chest x-ray [DATE]

CLINICAL DATA: CABG 3 days ago.  Pneumothorax.

EXAM:
PORTABLE CHEST 1 VIEW

[chest ap]
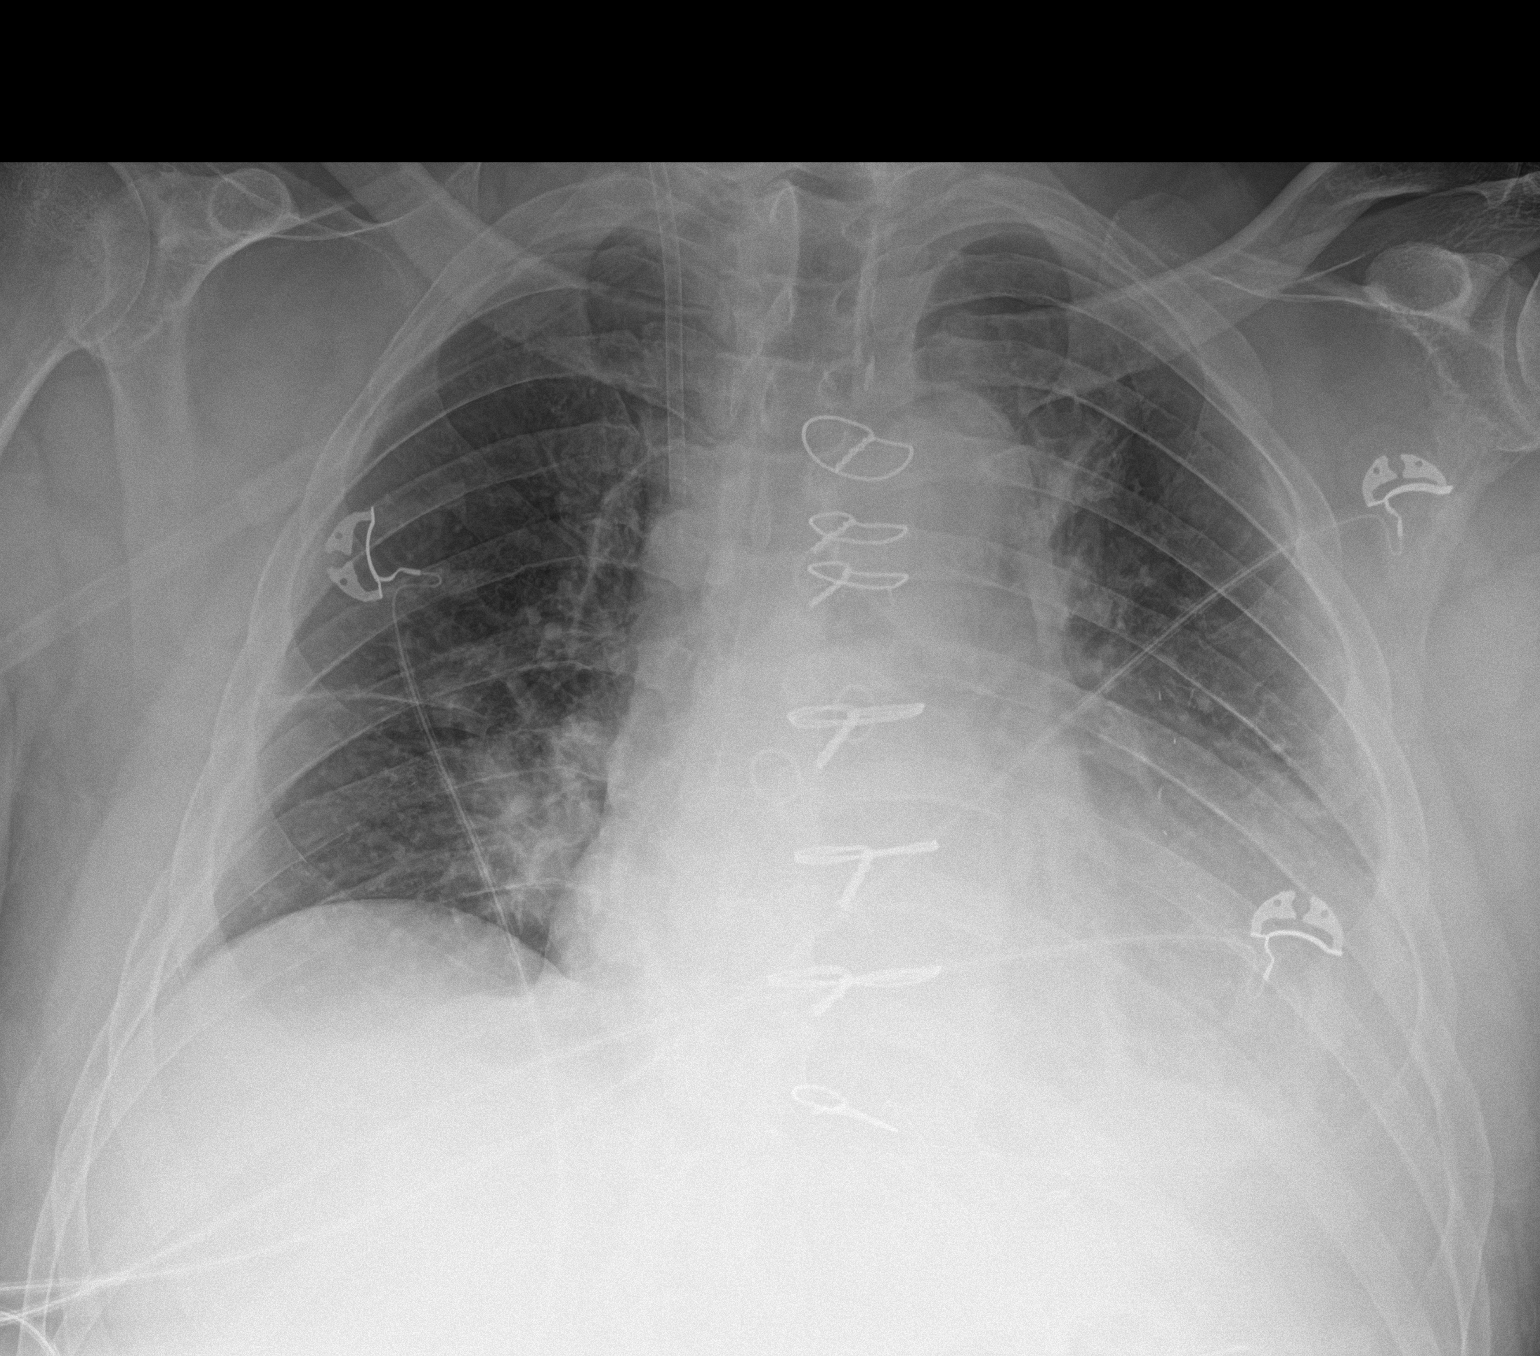

[1 of 1 positions shown; findings below may reference images not displayed]

FINDINGS: The heart is enlarged. Lung volumes remain low. Left-sided chest
tube was removed. There is no pneumothorax. Right IJ sheath remains.
Left basilar airspace disease, likely atelectasis remains. Left
greater than right pleural effusions remain.
IMPRESSION: 1. Left greater than right pleural effusions and atelectasis remain.
2. Interval removal left-sided chest tube without pneumothorax.

## 2018-01-13 MED ORDER — FUROSEMIDE 40 MG PO TABS
40.0000 mg | ORAL_TABLET | Freq: Every day | ORAL | Status: DC
  Administered 2018-01-13 – 2018-01-15 (×3): 40 mg via ORAL
  Filled 2018-01-13 (×3): qty 1

## 2018-01-13 MED ORDER — SODIUM CHLORIDE 0.9% FLUSH
3.0000 mL | INTRAVENOUS | Status: DC | PRN
  Administered 2018-01-14: 3 mL via INTRAVENOUS
  Filled 2018-01-13: qty 3

## 2018-01-13 MED ORDER — SODIUM CHLORIDE 0.9% FLUSH
3.0000 mL | Freq: Two times a day (BID) | INTRAVENOUS | Status: DC
  Administered 2018-01-13 – 2018-01-14 (×4): 3 mL via INTRAVENOUS

## 2018-01-13 MED ORDER — CARVEDILOL 12.5 MG PO TABS
12.5000 mg | ORAL_TABLET | Freq: Two times a day (BID) | ORAL | Status: DC
  Administered 2018-01-13 – 2018-01-15 (×5): 12.5 mg via ORAL
  Filled 2018-01-13 (×5): qty 1

## 2018-01-13 MED ORDER — INSULIN ASPART 100 UNIT/ML ~~LOC~~ SOLN: 0.0000 [IU] | Freq: Three times a day (TID) | SUBCUTANEOUS | Status: DC

## 2018-01-13 MED ORDER — POTASSIUM CHLORIDE CRYS ER 20 MEQ PO TBCR
20.0000 meq | EXTENDED_RELEASE_TABLET | Freq: Two times a day (BID) | ORAL | Status: DC
  Administered 2018-01-13 – 2018-01-14 (×4): 20 meq via ORAL
  Filled 2018-01-13 (×4): qty 1

## 2018-01-13 MED ORDER — MOVING RIGHT ALONG BOOK
Freq: Once | Status: AC
  Administered 2018-01-13: 1
  Filled 2018-01-13: qty 1

## 2018-01-13 MED ORDER — SODIUM CHLORIDE 0.9 % IV SOLN: 250.0000 mL | INTRAVENOUS | Status: DC | PRN

## 2018-01-13 MED ORDER — ALUM & MAG HYDROXIDE-SIMETH 200-200-20 MG/5ML PO SUSP: 15.0000 mL | Freq: Four times a day (QID) | ORAL | Status: DC | PRN

## 2018-01-13 MED ORDER — MAGNESIUM HYDROXIDE 400 MG/5ML PO SUSP: 30.0000 mL | Freq: Every day | ORAL | Status: DC | PRN

## 2018-01-13 NOTE — Progress Notes (Signed)
Report called to Judeth CornfieldStephanie, RN on 4E. Will transfer pt with all belongings.  Herma ArdMOSELEY, Gaynel Schaafsma F, RN

## 2018-01-13 NOTE — Progress Notes (Signed)
3 Days Post-Op Procedure(s) (LRB): CORONARY ARTERY BYPASS GRAFTING (CABG) (N/A) TRANSESOPHAGEAL ECHOCARDIOGRAM (TEE) (N/A) Subjective: Feels better Still c/o numbness left upper outer thigh Objective: Vital signs in last 24 hours: Temp:  [96.4 F (35.8 C)-98.6 F (37 C)] 98.6 F (37 C) (07/27 0748) Pulse Rate:  [72-102] 87 (07/27 0713) Cardiac Rhythm: Normal sinus rhythm (07/27 0400) Resp:  [10-20] 16 (07/27 0713) BP: (109-163)/(65-120) 126/65 (07/27 0713) SpO2:  [94 %-98 %] 97 % (07/27 0713) Weight:  [218 lb 14.7 oz (99.3 kg)] 218 lb 14.7 oz (99.3 kg) (07/27 0500)  Hemodynamic parameters for last 24 hours:    Intake/Output from previous day: 07/26 0701 - 07/27 0700 In: 346.9 [P.O.:240; I.V.:6.9; IV Piggyback:100] Out: 1269 [Urine:1265; Chest Tube:4] Intake/Output this shift: No intake/output data recorded.  General appearance: alert, cooperative and no distress Neurologic: motor intact Heart: regular rate and rhythm Lungs: diminished breath sounds bibasilar Abdomen: normal findings: soft, non-tender  Lab Results: Recent Labs    01/12/18 0422 01/13/18 0306  WBC 19.2* 14.7*  HGB 12.6* 11.6*  HCT 38.8* 35.0*  PLT 142* 139*   BMET:  Recent Labs    01/12/18 0422 01/13/18 0306  NA 136 136  K 4.9 3.8  CL 101 100  CO2 26 28  GLUCOSE 118* 111*  BUN 17 21*  CREATININE 0.99 1.07  CALCIUM 8.5* 8.4*    PT/INR:  Recent Labs    01/10/18 1355  LABPROT 15.5*  INR 1.24   ABG    Component Value Date/Time   PHART 7.367 01/10/2018 1857   HCO3 21.5 01/10/2018 1857   TCO2 23 01/11/2018 1630   ACIDBASEDEF 3.0 (H) 01/10/2018 1857   O2SAT 92.0 01/10/2018 1857   CBG (last 3)  Recent Labs    01/12/18 2329 01/13/18 0310 01/13/18 0747  GLUCAP 98 103* 123*    Assessment/Plan: S/P Procedure(s) (LRB): CORONARY ARTERY BYPASS GRAFTING (CABG) (N/A) TRANSESOPHAGEAL ECHOCARDIOGRAM (TEE) (N/A) Plan for transfer to step-down: see transfer orders  Doing well POD #  3 CV- stable, change back to coreg, continue lisinopril  Continue ASA, atorvastatin  RESP- continue IS, flutter. Xopenex PRN  RENAL- creatinine Ok, diuresed well yesterday, change to PO lasix  ENDO- CBG well controlled, change to AC,HS  Continue cardiac rehab   LOS: 4 days    Loreli SlotSteven C Britne Borelli 01/13/2018

## 2018-01-13 NOTE — Progress Notes (Signed)
      [address]       [place] [id number]             [phone]      POD # 3  Awaiting bed on step down  No new issues today  BP 115/71   Pulse 88   Temp 98.9 F (37.2 C) (Axillary)   Resp 15   Ht 5\' 8"  (1.727 m)   Wt 218 lb 14.7 oz (99.3 kg)   SpO2 97%   BMI 33.29 kg/m   Intake/Output Summary (Last 24 hours) at 01/13/2018 1632 Last data filed at 01/13/2018 0500 Gross per 24 hour  Intake 0 ml  Output 750 ml  Net -750 ml    Viviann SpareSteven C. Dorris FetchHendrickson, MD [facility] [phone]

## 2018-01-13 NOTE — Progress Notes (Signed)
SVT 180 noted on bedside ekg.  Pt asymptomatic.  States he was eating his eggs fast and "choked" a bit.  Lopressor 2.5 mg IV given.  Return of NSR noted.  Will continue to closely monitor.

## 2018-01-14 ENCOUNTER — Inpatient Hospital Stay (HOSPITAL_COMMUNITY): Payer: BLUE CROSS/BLUE SHIELD

## 2018-01-14 LAB — BASIC METABOLIC PANEL
ANION GAP: 9 (ref 5–15)
BUN: 18 mg/dL (ref 6–20)
CHLORIDE: 101 mmol/L (ref 98–111)
CO2: 26 mmol/L (ref 22–32)
Calcium: 8.2 mg/dL — ABNORMAL LOW (ref 8.9–10.3)
Creatinine, Ser: 0.89 mg/dL (ref 0.61–1.24)
GFR calc Af Amer: >60 mL/min (ref >60)
GLUCOSE: 95 mg/dL (ref 70–99)
POTASSIUM: 3.7 mmol/L (ref 3.5–5.1)
Sodium: 136 mmol/L (ref 135–145)

## 2018-01-14 LAB — GLUCOSE, CAPILLARY
GLUCOSE-CAPILLARY: 101 mg/dL — AB (ref 70–99)
GLUCOSE-CAPILLARY: 103 mg/dL — AB (ref 70–99)
Glucose-Capillary: 117 mg/dL — ABNORMAL HIGH (ref 70–99)
Glucose-Capillary: 115 mg/dL — ABNORMAL HIGH (ref 70–99)

## 2018-01-14 LAB — CBC
HCT: 34.3 % — ABNORMAL LOW (ref 39.0–52.0)
HEMOGLOBIN: 11.1 g/dL — AB (ref 13.0–17.0)
MCH: 31.8 pg (ref 26.0–34.0)
MCHC: 32.4 g/dL (ref 30.0–36.0)
MCV: 98.3 fL (ref 78.0–100.0)
PLATELETS: 147 10*3/uL — AB (ref 150–400)
RBC: 3.49 MIL/uL — AB (ref 4.22–5.81)
RDW: 13.3 % (ref 11.5–15.5)
WBC: 10.9 10*3/uL — AB (ref 4.0–10.5)

## 2018-01-14 IMAGING — CR DG CHEST 2V
2 series · 2 of 2 positions shown · non-contrast
Comparison: [DATE]

CLINICAL DATA: Follow-up atelectasis, chest pain and shortness of
breath

EXAM:
CHEST - 2 VIEW

[chest ap]
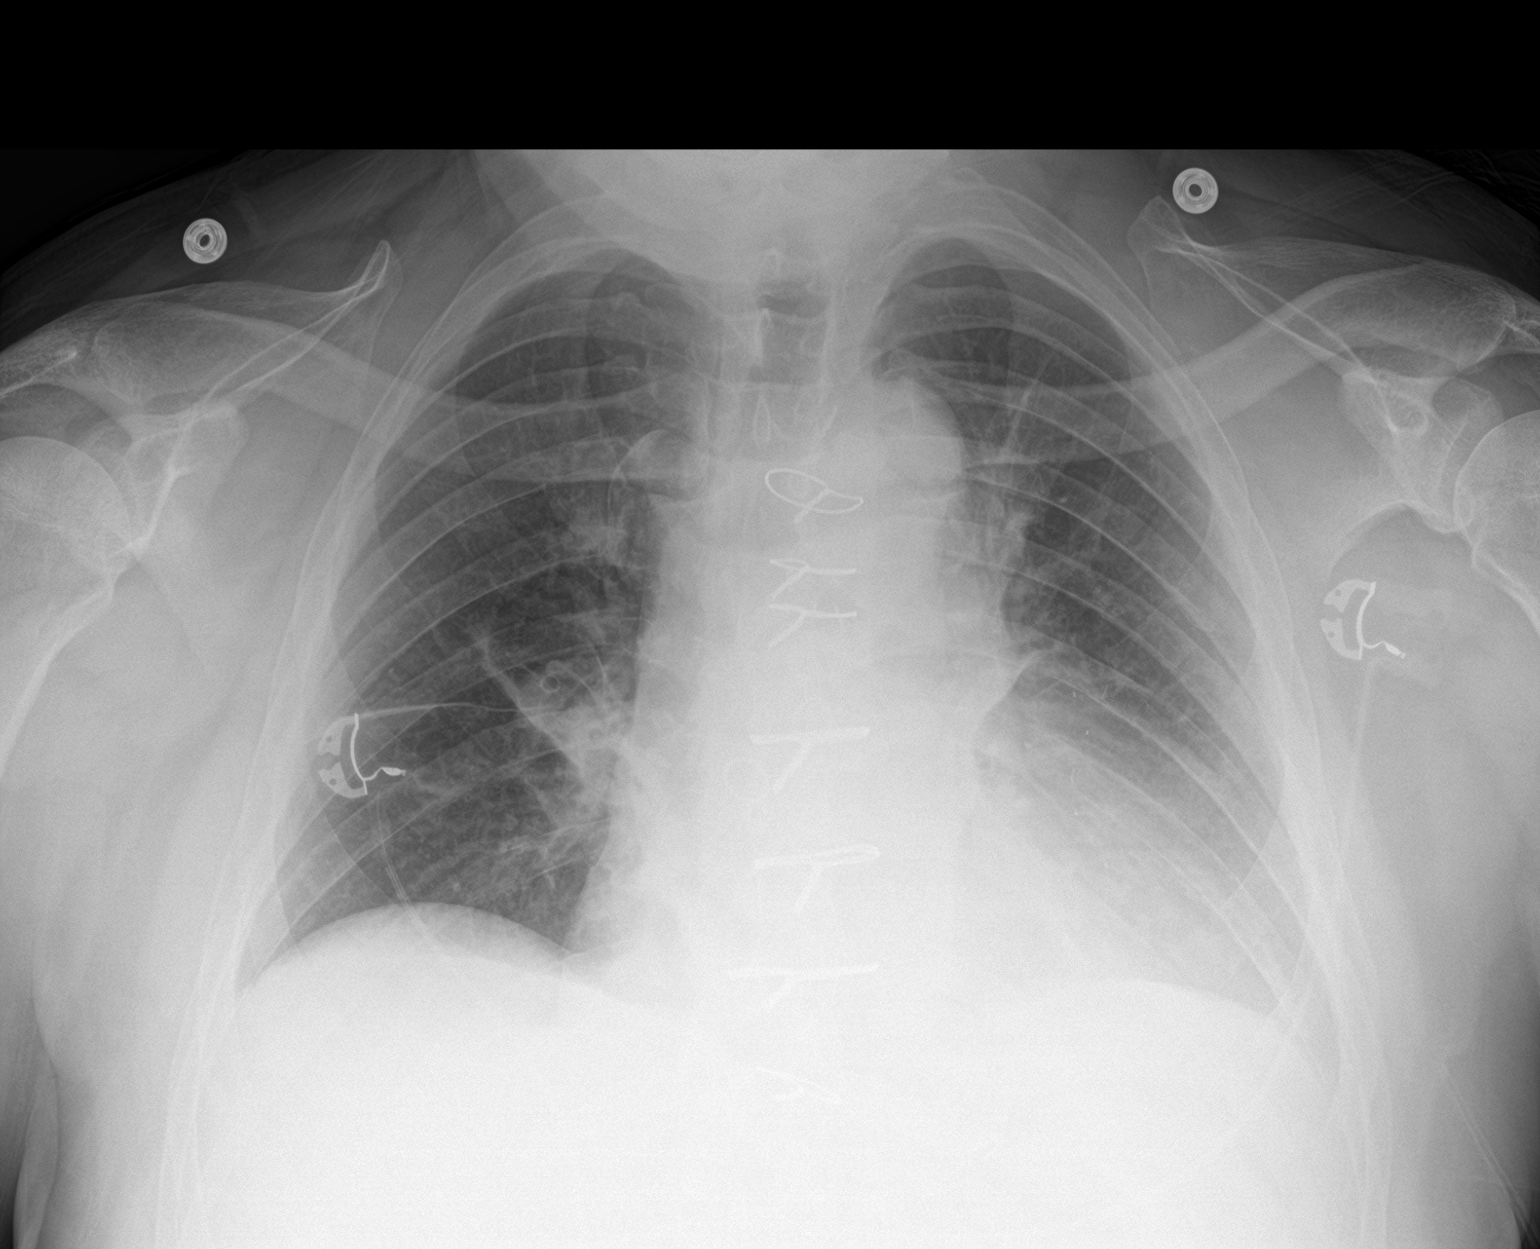

[chest lat]
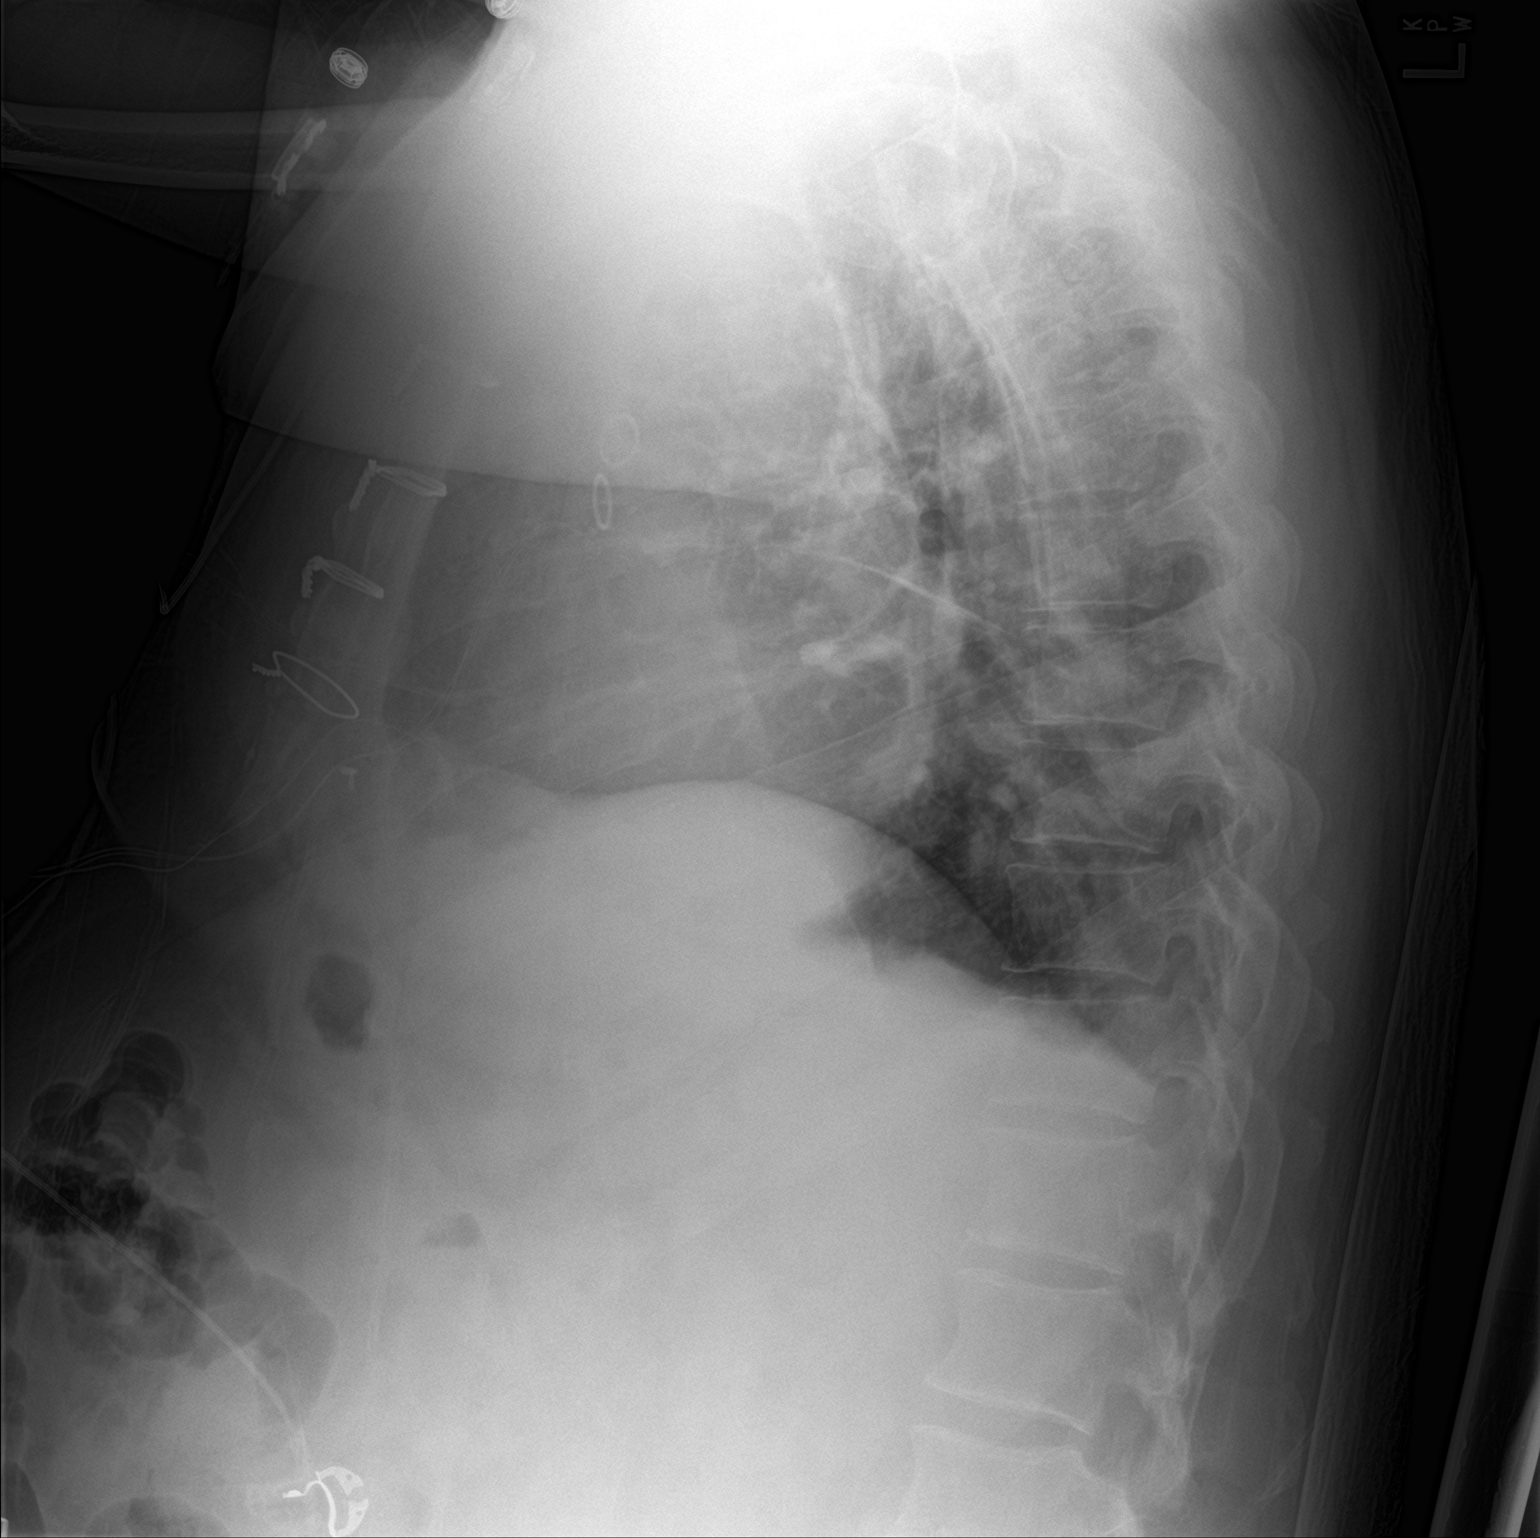

[2 of 2 positions shown; findings below may reference images not displayed]

FINDINGS: Cardiac shadow remains enlarged. Postsurgical changes are again
seen. Mild atelectatic changes are noted in the right perihilar
region. Significant improved aeration in the bases is noted
particularly on the left. No pneumothorax is seen.
IMPRESSION: Mild residual right perihilar atelectatic changes. Overall improved
aeration is noted.

## 2018-01-14 NOTE — Progress Notes (Signed)
Patient EPW pulled per protocol and as ordered. All ends intact patient reminded to lie supine approximately one hour. Will continue to monitor patient. Loreen Bankson, Randall AnKristin Jessup RN

## 2018-01-14 NOTE — Progress Notes (Addendum)
[address]       [place] [address]             [phone]      4 Days Post-Op Procedure(s) (LRB): CORONARY ARTERY BYPASS GRAFTING (CABG) (N/A) TRANSESOPHAGEAL ECHOCARDIOGRAM (TEE) (N/A) Subjective: Some SOB at times, mostly feels as though he is making good progress  Objective: Vital signs in last 24 hours: Temp:  [98 F (36.7 C)-99 F (37.2 C)] 98.1 F (36.7 C) (07/28 0519) Pulse Rate:  [67-89] 87 (07/27 2242) Cardiac Rhythm: Normal sinus rhythm (07/28 0700) Resp:  [8-24] 22 (07/28 0519) BP: (95-140)/(62-94) 140/94 (07/28 0519) SpO2:  [93 %-98 %] 93 % (07/28 0519) Weight:  [100 kg (220 lb 7.4 oz)-100.6 kg (221 lb 12.8 oz)] 100.6 kg (221 lb 12.8 oz) (07/28 0513)  Hemodynamic parameters for last 24 hours:    Intake/Output from previous day: 07/27 0701 - 07/28 0700 In: 600 [P.O.:600] Out: 300 [Urine:300] Intake/Output this shift: No intake/output data recorded.  General appearance: alert, cooperative and no distress Heart: regular rate and rhythm Lungs: mildly dim right base Abdomen: benign Extremities: + BLE edema Wound: incis healing well  Lab Results: Recent Labs    01/13/18 0306 01/14/18 0418  WBC 14.7* 10.9*  HGB 11.6* 11.1*  HCT 35.0* 34.3*  PLT 139* 147*   BMET:  Recent Labs    01/13/18 0306 01/14/18 0418  NA 136 136  K 3.8 3.7  CL 100 101  CO2 28 26  GLUCOSE 111* 95  BUN 21* 18  CREATININE 1.07 0.89  CALCIUM 8.4* 8.2*    PT/INR: No results for input(s): LABPROT, INR in the last 72 hours. ABG    Component Value Date/Time   PHART 7.367 01/10/2018 1857   HCO3 21.5 01/10/2018 1857   TCO2 23 01/11/2018 1630   ACIDBASEDEF 3.0 (H) 01/10/2018 1857   O2SAT 92.0 01/10/2018 1857   CBG (last 3)  Recent Labs    01/13/18 1607 01/13/18 2209 01/14/18 0723  GLUCAP 109* 109* 101*    Meds Scheduled Meds: . acetaminophen  1,000 mg Oral Q6H   Or  . acetaminophen (TYLENOL) oral liquid 160 mg/5 mL  1,000 mg Per Tube  Q6H  . aspirin EC  325 mg Oral Daily   Or  . aspirin  324 mg Per Tube Daily  . atorvastatin  80 mg Oral q1800  . bisacodyl  10 mg Oral Daily   Or  . bisacodyl  10 mg Rectal Daily  . carvedilol  12.5 mg Oral BID WC  . docusate sodium  200 mg Oral Daily  . furosemide  40 mg Oral Daily  . insulin aspart  0-15 Units Subcutaneous TID WC  . lisinopril  5 mg Oral Daily  . metoCLOPramide (REGLAN) injection  10 mg Intravenous Q6H  . pantoprazole  40 mg Oral Daily  . potassium chloride  20 mEq Oral BID  . sodium chloride flush  3 mL Intravenous Q12H  . thiamine  100 mg Oral Daily   Continuous Infusions: . sodium chloride     PRN Meds:.sodium chloride, alum & mag hydroxide-simeth, levalbuterol, magnesium hydroxide, ondansetron (ZOFRAN) IV, oxyCODONE, sodium chloride flush, traMADol  Xrays Dg Chest 2 View  Result Date: 01/14/2018 CLINICAL DATA:  Follow-up atelectasis, chest pain and shortness of breath EXAM: CHEST - 2 VIEW COMPARISON:  01/13/2018 FINDINGS: Cardiac shadow remains enlarged. Postsurgical changes are again seen. Mild atelectatic changes are noted in the right perihilar region. Significant improved aeration  in the bases is noted particularly on the left. No pneumothorax is seen. IMPRESSION: Mild residual right perihilar atelectatic changes. Overall improved aeration is noted. Electronically Signed   By: Alcide CleverMark  Lukens M.D.   On: 01/14/2018 07:43   Dg Chest Port 1 View  Result Date: 01/13/2018 CLINICAL DATA:  CABG 3 days ago.  Pneumothorax. EXAM: PORTABLE CHEST 1 VIEW COMPARISON:  One-view chest x-ray 01/12/2018 FINDINGS: The heart is enlarged. Lung volumes remain low. Left-sided chest tube was removed. There is no pneumothorax. Right IJ sheath remains. Left basilar airspace disease, likely atelectasis remains. Left greater than right pleural effusions remain. IMPRESSION: 1. Left greater than right pleural effusions and atelectasis remain. 2. Interval removal left-sided chest tube without  pneumothorax. Electronically Signed   By: Marin Robertshristopher  Mattern M.D.   On: 01/13/2018 07:45    Assessment/Plan: S/P Procedure(s) (LRB): CORONARY ARTERY BYPASS GRAFTING (CABG) (N/A) TRANSESOPHAGEAL ECHOCARDIOGRAM (TEE) (N/A) 1 good overall progress 2 some volume overload cont to diuresis 3 SBP is variable 100-140's- cont to monitor on current RX 4 sinus rhythm- d/c epw's today 5 BS adeq control - discussed a heart healthy diet/lifestyle, he has pretty advanced metabolic syndrome. Discussed smoking cessation. Currently on high dose statin.  6 H/H is stable 7 renal fxn is stable 8 cont pulm rx/toilet 9 cont cardiac rehab 10 poss home in am  LOS: 5 days    Edward Powell 01/14/2018 Patient seen and examined, agree with above Possibly home in AM  SnellingSteven C. Dorris FetchHendrickson, MD [facility] [phone]

## 2018-01-14 NOTE — Progress Notes (Signed)
Patient ambulated in hallway with family. Rolene Andrades Jessup RN  

## 2018-01-14 NOTE — Progress Notes (Signed)
Exit care documents given to patient and family on heart healthy diet and seasoning with out salt. Antaniya Venuti, Randall AnKristin Jessup RN

## 2018-01-15 DIAGNOSIS — I2510 Atherosclerotic heart disease of native coronary artery without angina pectoris: Secondary | ICD-10-CM

## 2018-01-15 DIAGNOSIS — I25118 Atherosclerotic heart disease of native coronary artery with other forms of angina pectoris: Secondary | ICD-10-CM

## 2018-01-15 DIAGNOSIS — I25119 Atherosclerotic heart disease of native coronary artery with unspecified angina pectoris: Secondary | ICD-10-CM

## 2018-01-15 LAB — GLUCOSE, CAPILLARY
GLUCOSE-CAPILLARY: 123 mg/dL — AB (ref 70–99)
Glucose-Capillary: 125 mg/dL — ABNORMAL HIGH (ref 70–99)

## 2018-01-15 LAB — BASIC METABOLIC PANEL
Anion gap: 9 (ref 5–15)
BUN: 13 mg/dL (ref 6–20)
CHLORIDE: 102 mmol/L (ref 98–111)
CO2: 26 mmol/L (ref 22–32)
CREATININE: 0.88 mg/dL (ref 0.61–1.24)
Calcium: 8.7 mg/dL — ABNORMAL LOW (ref 8.9–10.3)
GFR calc non Af Amer: >60 mL/min (ref >60)
Glucose, Bld: 124 mg/dL — ABNORMAL HIGH (ref 70–99)
Potassium: 3.8 mmol/L (ref 3.5–5.1)
SODIUM: 137 mmol/L (ref 135–145)

## 2018-01-15 LAB — MAGNESIUM: Magnesium: 2.1 mg/dL (ref 1.7–2.4)

## 2018-01-15 MED ORDER — LISINOPRIL 20 MG PO TABS: 20.0000 mg | ORAL_TABLET | Freq: Every day | ORAL | 1 refills | Status: DC

## 2018-01-15 MED ORDER — LISINOPRIL 10 MG PO TABS
10.0000 mg | ORAL_TABLET | Freq: Every day | ORAL | Status: DC
  Administered 2018-01-15: 10 mg via ORAL
  Filled 2018-01-15: qty 1

## 2018-01-15 MED ORDER — ASPIRIN 325 MG PO TBEC: 325.0000 mg | DELAYED_RELEASE_TABLET | Freq: Every day | ORAL | 0 refills | Status: DC

## 2018-01-15 MED ORDER — CARVEDILOL 25 MG PO TABS: 25.0000 mg | ORAL_TABLET | Freq: Two times a day (BID) | ORAL | 1 refills | Status: DC

## 2018-01-15 MED ORDER — POTASSIUM CHLORIDE CRYS ER 20 MEQ PO TBCR: 20.0000 meq | EXTENDED_RELEASE_TABLET | Freq: Every day | ORAL | 0 refills | Status: DC

## 2018-01-15 MED ORDER — CLOPIDOGREL BISULFATE 75 MG PO TABS: 75.0000 mg | ORAL_TABLET | Freq: Every day | ORAL | 1 refills | Status: DC

## 2018-01-15 MED ORDER — OXYCODONE HCL 5 MG PO TABS: ORAL_TABLET | ORAL | 0 refills | Status: DC

## 2018-01-15 MED ORDER — POTASSIUM CHLORIDE CRYS ER 20 MEQ PO TBCR
20.0000 meq | EXTENDED_RELEASE_TABLET | Freq: Every day | ORAL | Status: DC
  Administered 2018-01-15: 20 meq via ORAL
  Filled 2018-01-15: qty 1

## 2018-01-15 MED ORDER — LISINOPRIL 10 MG PO TABS: 20.0000 mg | ORAL_TABLET | Freq: Every day | ORAL | Status: DC

## 2018-01-15 MED ORDER — LISINOPRIL 10 MG PO TABS: 10.0000 mg | ORAL_TABLET | Freq: Every day | ORAL | 1 refills | Status: DC

## 2018-01-15 MED ORDER — CARVEDILOL 25 MG PO TABS: 25.0000 mg | ORAL_TABLET | Freq: Two times a day (BID) | ORAL | Status: DC

## 2018-01-15 MED ORDER — FUROSEMIDE 40 MG PO TABS: 40.0000 mg | ORAL_TABLET | Freq: Every day | ORAL | 0 refills | Status: DC

## 2018-01-15 MED FILL — Sodium Bicarbonate IV Soln 8.4%: INTRAVENOUS | Qty: 50 | Status: AC

## 2018-01-15 MED FILL — Mannitol IV Soln 20%: INTRAVENOUS | Qty: 500 | Status: AC

## 2018-01-15 MED FILL — Heparin Sodium (Porcine) Inj 1000 Unit/ML: INTRAMUSCULAR | Qty: 20 | Status: AC

## 2018-01-15 MED FILL — Sodium Chloride IV Soln 0.9%: INTRAVENOUS | Qty: 2000 | Status: AC

## 2018-01-15 MED FILL — Electrolyte-R (PH 7.4) Solution: INTRAVENOUS | Qty: 3000 | Status: AC

## 2018-01-15 MED FILL — Lidocaine HCl(Cardiac) IV PF Soln Pref Syr 100 MG/5ML (2%): INTRAVENOUS | Qty: 5 | Status: AC

## 2018-01-15 NOTE — Progress Notes (Signed)
Progress Note  Patient Name: Edward Powell Date of Encounter: 01/15/2018  Primary Cardiologist: Lorine Bears, MD   Subjective   Doing well--has had difficulty sleeping, noticed occasional palpitations with bowel movement, etc. Breathing well, sore at incision but otherwise no concerns.  Inpatient Medications    Scheduled Meds: . acetaminophen  1,000 mg Oral Q6H   Or  . acetaminophen (TYLENOL) oral liquid 160 mg/5 mL  1,000 mg Per Tube Q6H  . aspirin EC  325 mg Oral Daily   Or  . aspirin  324 mg Per Tube Daily  . atorvastatin  80 mg Oral q1800  . bisacodyl  10 mg Oral Daily   Or  . bisacodyl  10 mg Rectal Daily  . carvedilol  12.5 mg Oral BID WC  . docusate sodium  200 mg Oral Daily  . furosemide  40 mg Oral Daily  . lisinopril  10 mg Oral Daily  . metoCLOPramide (REGLAN) injection  10 mg Intravenous Q6H  . pantoprazole  40 mg Oral Daily  . potassium chloride  20 mEq Oral Daily  . sodium chloride flush  3 mL Intravenous Q12H  . thiamine  100 mg Oral Daily   Continuous Infusions: . sodium chloride     PRN Meds: sodium chloride, alum & mag hydroxide-simeth, levalbuterol, magnesium hydroxide, ondansetron (ZOFRAN) IV, oxyCODONE, sodium chloride flush, traMADol   Vital Signs    Vitals:   01/14/18 1749 01/14/18 1753 01/14/18 2047 01/15/18 0410  BP: 115/75 115/75 102/80 (!) 152/91  Pulse: 89     Resp:  18 16 12   Temp:   98.7 F (37.1 C) 98.9 F (37.2 C)  TempSrc:   Oral Oral  SpO2:   96% 96%  Weight:    221 lb 6.4 oz (100.4 kg)  Height:        Intake/Output Summary (Last 24 hours) at 01/15/2018 0914 Last data filed at 01/14/2018 2048 Gross per 24 hour  Intake 240 ml  Output -  Net 240 ml   Filed Weights   01/13/18 2230 01/14/18 0513 01/15/18 0410  Weight: 220 lb 7.4 oz (100 kg) 221 lb 12.8 oz (100.6 kg) 221 lb 6.4 oz (100.4 kg)    Telemetry    Short runs (~6 beats) of likely afob, 3 beats NSVT, brief run of ventricular bigeminy overnight - Personally  Reviewed  ECG    ECG 01/11/18 NSR- Personally Reviewed  Physical Exam   GEN: No acute distress.   Neck: supple, no JVD Cardiac: regular S1 and S2, no murmurs, rubs, or gallops. Healing sternal wound Respiratory: Clear to auscultation bilaterally. GI: Soft, nontender, non-distended. Bowel sounds normal MS: No edema; No deformity. Neuro:  Nonfocal, moves all limbs independently Psych: Normal affect   Labs    Chemistry Recent Labs  Lab 01/09/18 1528  01/12/18 0422 01/13/18 0306 01/14/18 0418  NA 138   < > 136 136 136  K 4.3   < > 4.9 3.8 3.7  CL 101   < > 101 100 101  CO2 27   < > 26 28 26   GLUCOSE 151*   < > 118* 111* 95  BUN 14   < > 17 21* 18  CREATININE 0.99   < > 0.99 1.07 0.89  CALCIUM 9.7   < > 8.5* 8.4* 8.2*  PROT 7.5  --   --   --   --   ALBUMIN 3.7  --   --   --   --  AST 44*  --   --   --   --   ALT 103*  --   --   --   --   ALKPHOS 77  --   --   --   --   BILITOT 0.9  --   --   --   --   GFRNONAA >60   < > >60 >60 >60  GFRAA >60   < > >60 >60 >60  ANIONGAP 10   < > 9 8 9    < > = values in this interval not displayed.     Hematology Recent Labs  Lab 01/12/18 0422 01/13/18 0306 01/14/18 0418  WBC 19.2* 14.7* 10.9*  RBC 3.94* 3.59* 3.49*  HGB 12.6* 11.6* 11.1*  HCT 38.8* 35.0* 34.3*  MCV 98.5 97.5 98.3  MCH 32.0 32.3 31.8  MCHC 32.5 33.1 32.4  RDW 13.2 13.2 13.3  PLT 142* 139* 147*    Cardiac Enzymes Recent Labs  Lab 01/08/18 1302 01/08/18 1645 01/09/18 0510  TROPONINI 0.75* 0.76* 0.43*   No results for input(s): TROPIPOC in the last 168 hours.   BNPNo results for input(s): BNP, PROBNP in the last 168 hours.   DDimer No results for input(s): DDIMER in the last 168 hours.   Radiology    Dg Chest 2 View  Result Date: 01/14/2018 CLINICAL DATA:  Follow-up atelectasis, chest pain and shortness of breath EXAM: CHEST - 2 VIEW COMPARISON:  01/13/2018 FINDINGS: Cardiac shadow remains enlarged. Postsurgical changes are again seen. Mild  atelectatic changes are noted in the right perihilar region. Significant improved aeration in the bases is noted particularly on the left. No pneumothorax is seen. IMPRESSION: Mild residual right perihilar atelectatic changes. Overall improved aeration is noted. Electronically Signed   By: Alcide Clever M.D.   On: 01/14/2018 07:43    Cardiac Studies  Echo 01/09/18 Left ventricle: The cavity size was normal. There was mild   concentric hypertrophy. Systolic function was normal. The   estimated ejection fraction was in the range of 55% to 60%. Wall   motion was normal; there were no regional wall motion   abnormalities. Doppler parameters are consistent with abnormal   left ventricular relaxation (grade 1 diastolic dysfunction). - Aortic valve: Transvalvular velocity was within the normal range.   There was no stenosis. There was no regurgitation. - Mitral valve: Transvalvular velocity was within the normal range.   There was no evidence for stenosis. There was trivial   regurgitation. - Right ventricle: The cavity size was normal. Wall thickness was   normal. Systolic function was normal. - Atrial septum: No defect or patent foramen ovale was identified   by color flow Doppler. - Tricuspid valve: There was no regurgitation.  LEFT HEART CATH AND CORONARY ANGIOGRAPHY by Dr. Okey Dupre on 01/09/2018:   Vessel is large.  Dist LM lesion 50% stenosed  Dist LM lesion is 50% stenosed. The lesion is eccentric.  Left Anterior Descending  Vessel is large.  Ost LAD lesion 60% stenosed  Ost LAD lesion is 60% stenosed. The lesion is eccentric.  Prox LAD to Mid LAD lesion 80% stenosed  Prox LAD to Mid LAD lesion is 80% stenosed. Focal napkin ring lesion at at takeoff of first major septal branch.  First Diagonal Branch  Vessel is small in size.  Second Diagonal Branch  Vessel is small in size.  Third Diagonal Branch  Vessel is small in size.  Ramus Intermedius  Vessel is moderate in size. Vessel  is  angiographically normal.  Left Circumflex  Vessel is moderate in size.  Prox Cx lesion 100% stenosed  Prox Cx lesion is 100% stenosed.  Second Obtuse Marginal Branch  Vessel is small in size.  Collaterals  2nd Mrg filled by collaterals from Dist LAD.    Right Coronary Artery  Vessel is moderate in size.  Prox RCA lesion 70% stenosed  Prox RCA lesion is 70% stenosed.  Mid RCA-1 lesion 40% stenosed  Mid RCA-1 lesion is 40% stenosed.  Mid RCA-2 lesion 90% stenosed  Mid RCA-2 lesion is 90% stenosed.  Dist RCA lesion 60% stenosed  Dist RCA lesion is 60% stenosed    Patient Profile     47 y.o. male with CAD, tobacco/alcohol use now s/o CABG x4 (LIMA-LAD, sequential SVG-intermediate and distal Lcx, SVG-PDA) on 01/10/18. Doing well, preparing for discharge.  Assessment & Plan    CAD s/p CABG as above, with HTN, HLD, tobacco and alcohol use: -medication reconciliation as below.  -Given that he presented as an NSTEMI, would prefer aspirin 81 mg and clopidogrel 75 mg for one year.  -continue high intensity statin, atorvastatin 80 mg -continue carvedilol and lisinopril. EF normal -likely can phase out furosemide or change to weight based dosing as he appears euvolemic today -cardiac rehab planned -tobacco cessation key to secondary prevention.     Time Spent Directly with Patient: I have spent a total of 30 minutes with the patient reviewing hospital notes, telemetry, EKGs, labs and examining the patient as well as establishing an assessment and plan that was discussed personally with the patient.  > 50% of time was spent in direct patient care.  Length of Stay:  LOS: 6 days   Jodelle RedBridgette Shukri Nistler, MD, PhD [facility]  [facility]   01/15/2018, 9:14 AM  [facility] will sign off.    CARDIOLOGY RECOMMENDATIONS:  Discharge is anticipated in the next 48 hours. Recommendations for medications and follow up:  Discharge Medications: Continue medications as they are  [facility]. Exceptions to the above:  Aspirin appears duplicated, continue aspirin at discharge  Given that he presented as an NSTEMI, ultimately would prefer aspirin 81 mg and clopidogrel 75 mg for one year  Continue atorvastatin 80 mg  Continue carvedilol 12.5 mg BID  Continue lasix, can adjust to weight based dosing as he appears euvolemic and he has a normal EF. May be able to stop completely.  Continue lisinopril 10 mg  Follow Up: The patient's Primary Cardiologist is Lorine BearsMuhammad Arida, MD  Follow up in the office in 2 week(s). Has appointment for 02/01/18  Signed,  Jodelle RedBridgette Germany Chelf, MD  9:21 AM 01/15/2018  [facility]  For questions or updates, please contact [facility] Please consult [email or web address] for contact info under Cardiology/STEMI.

## 2018-01-15 NOTE — Progress Notes (Signed)
CARDIAC REHAB PHASE I   PRE:  Rate/Rhythm: 93 Sr  BP:  Sitting: 144/98      SaO2: 93 RA  MODE:  Ambulation: 640 ft 113 peak HR  POST:  Rate/Rhythm: 98 SR  BP:  Sitting: 145/98    SaO2: 96 RA   Pt ambulated 62940ft in hallway independently with steady gait. Pt denies CP or SOB. Pt encouraged continued use of both IS and flutter. Pt instructed to shower daily and monitor incisions. Cardiac surgery booklet given along with heart healthy diet. Reviewed sternal precautions, pt states he has an in-the-tube sheet. Reviewed restrictions and exercise guidelines. Pt states he is going to stay with his parents after d/c. Pt declining fake cigarette and smoking cessation tip sheet at this time. Stressed importance of risk factor modification. Will send referral to [facility] [place].   [phone] Reynold Boweneresa  Karol Skarzynski, RN BSN 01/15/2018 8:43 AM

## 2018-01-15 NOTE — Progress Notes (Signed)
   RN reported multiple episodes of SVT this morning with HR up to the 170s. Telemetry reviewed - he has had multiple episodes of SVT, some bigeminy, and brief NSVT (max ~6 beats) in the past 24 hours. SVT episodes have been 1-2 minutes max and resolve spontaneously. Patient reported feeling intermittently tachycardic this morning. He is anxious to go home. Denies chest pain, dizziness, lightheadedness, or syncope. Discussed vagal maneuvers in the event that he feels his heart racing. Discussed with primary team - will increase coreg to 25mg  BID. Okay to discharge today from a cardiology standpoint.

## 2018-01-15 NOTE — Telephone Encounter (Signed)
Patient currently admitted at this time. 

## 2018-01-15 NOTE — Progress Notes (Addendum)
Nurse paged me while I was in OR that patient had a run of SVT with HR in the 170's (has had brief runs post op). Also had hypertension 168/104. We have asked cardiology to re evaluate, and  I increased his Lisinopril to 20 mg daily for better BP control. Patient unable to sleep and still wants to go home. Also, per Dr. Tyrone SageGerhardt, ok to decrease ecasa to 81 mg daily and start Plavix (s/p NSTEMI).

## 2018-01-15 NOTE — Progress Notes (Signed)
Received call from centralized tele that pt was having runs of atrial tach. Pt states he was washing up and took the monitor off. Pt walked with cardiac rehab with no issues. Then received another call from centralized tele stating that pt had 10 beats of Vtach, HR as high as 215. Assessed pt, found in no  distress. EKG showed NSR. HR ranging from 90s-120s. BP 168/104 manually. TCTS notified, gave orders to call cardiology. Cardiology notified, will check labs and round on pt. Will continue to monitor.

## 2018-01-15 NOTE — Progress Notes (Addendum)
      [address]       [place] [id number]             [phone]        5 Days Post-Op Procedure(s) (LRB): CORONARY ARTERY BYPASS GRAFTING (CABG) (N/A) TRANSESOPHAGEAL ECHOCARDIOGRAM (TEE) (N/A)  Subjective: Patient without complaints this am. He wants to go home.  Objective: Vital signs in last 24 hours: Temp:  [98.7 F (37.1 C)-98.9 F (37.2 C)] 98.9 F (37.2 C) (07/29 0410) Pulse Rate:  [80-97] 89 (07/28 1749) Cardiac Rhythm: Normal sinus rhythm (07/28 1900) Resp:  [12-19] 12 (07/29 0410) BP: (102-152)/(75-91) 152/91 (07/29 0410) SpO2:  [95 %-97 %] 96 % (07/29 0410) Weight:  [221 lb 6.4 oz (100.4 kg)] 221 lb 6.4 oz (100.4 kg) (07/29 0410)  Pre op weight 99.1 kg Current Weight  01/15/18 221 lb 6.4 oz (100.4 kg)       Intake/Output from previous day: 07/28 0701 - 07/29 0700 In: 240 [P.O.:240] Out: -    Physical Exam:  Cardiovascular: RRR Pulmonary: Clear to auscultation bilaterally;  Abdomen: Soft, non tender, bowel sounds present. Extremities: Mild bilateral lower extremity edema. Wounds: Clean and dry.  No erythema or signs of infection.  Lab Results: CBC: Recent Labs    01/13/18 0306 01/14/18 0418  WBC 14.7* 10.9*  HGB 11.6* 11.1*  HCT 35.0* 34.3*  PLT 139* 147*   BMET:  Recent Labs    01/13/18 0306 01/14/18 0418  NA 136 136  K 3.8 3.7  CL 100 101  CO2 28 26  GLUCOSE 111* 95  BUN 21* 18  CREATININE 1.07 0.89  CALCIUM 8.4* 8.2*    PT/INR:  Lab Results  Component Value Date   INR 1.24 01/10/2018   INR 0.96 01/09/2018   INR 0.94 01/08/2018   ABG:  INR: Will add last result for INR, ABG once components are confirmed Will add last 4 CBG results once components are confirmed  Assessment/Plan:  1. CV-On Coreg 12.5 mg bid and Lisinopril 5 mg daily . He is hypertensive this am so will increase Lisinopril.  2. Pulmonary-On room air. Encourage incentive spirometer. 3. Volume overload-On Lasix 40 mg daily 4. CBGs  117/115/125. On scheduled Insulin. Pre op HGA1C 6.2. He has pre diabetes and will require further surveillance of HGA1C after discharge. 5. Mild thrombocytopenia-last platelets 147,000  6. History of etoh abuse-on thiamine 7. Mild ABL anemia-Last H and H slightly decreased to 11.1 and 34.3 9. Likely discharge-patient states he has made arrangements to for someone to stay with him.    Donielle M ZimmermanPA-C 01/15/2018,7:30 AM [phone]  Brief svt today, seen by cardiology ok for d/c on asa and plavix   I have seen and examined Edward Powell and agree with the above assessment  and plan.  Delight OvensEdward B Escher Harr MD [place] [phone] Office [phone] 01/15/2018 2:55 PM

## 2018-01-17 NOTE — Telephone Encounter (Signed)
No service this number listed.

## 2018-01-18 NOTE — Telephone Encounter (Signed)
Attempted to call the patient. Message received that the patient's only contact # [phone] is not in service.   No DPR on file to speak with anyone else.  Will try back later.

## 2018-01-22 NOTE — Telephone Encounter (Signed)
Attempted to call the patient. Message received that the patient's only contact # [phone] is not in service.

## 2018-01-24 DIAGNOSIS — Z736 Limitation of activities due to disability: Secondary | ICD-10-CM

## 2018-01-25 ENCOUNTER — Other Ambulatory Visit: Payer: Self-pay | Admitting: *Deleted

## 2018-01-25 DIAGNOSIS — E781 Pure hyperglyceridemia: Secondary | ICD-10-CM

## 2018-01-25 DIAGNOSIS — J9811 Atelectasis: Secondary | ICD-10-CM

## 2018-01-25 MED ORDER — IPRATROPIUM-ALBUTEROL 0.5-2.5 (3) MG/3ML IN SOLN: 3.0000 mL | Freq: Four times a day (QID) | RESPIRATORY_TRACT | 0 refills | Status: DC

## 2018-01-25 MED ORDER — ATORVASTATIN CALCIUM 80 MG PO TABS
80.0000 mg | ORAL_TABLET | Freq: Every day | ORAL | 1 refills | Status: DC
Start: 2018-01-25 — End: 2018-02-01

## 2018-01-26 DIAGNOSIS — J45909 Unspecified asthma, uncomplicated: Secondary | ICD-10-CM | POA: Diagnosis not present

## 2018-02-01 ENCOUNTER — Encounter: Payer: Self-pay | Admitting: Cardiovascular Disease

## 2018-02-01 ENCOUNTER — Ambulatory Visit (INDEPENDENT_AMBULATORY_CARE_PROVIDER_SITE_OTHER): Payer: BLUE CROSS/BLUE SHIELD | Admitting: Cardiovascular Disease

## 2018-02-01 VITALS — BP 125/94 | HR 72 | Ht 68.0 in | Wt 201.5 lb

## 2018-02-01 DIAGNOSIS — I2510 Atherosclerotic heart disease of native coronary artery without angina pectoris: Secondary | ICD-10-CM | POA: Diagnosis not present

## 2018-02-01 DIAGNOSIS — I10 Essential (primary) hypertension: Secondary | ICD-10-CM

## 2018-02-01 DIAGNOSIS — E781 Pure hyperglyceridemia: Secondary | ICD-10-CM | POA: Diagnosis not present

## 2018-02-01 DIAGNOSIS — E782 Mixed hyperlipidemia: Secondary | ICD-10-CM | POA: Diagnosis not present

## 2018-02-01 MED ORDER — ATORVASTATIN CALCIUM 80 MG PO TABS: 80.0000 mg | ORAL_TABLET | Freq: Every day | ORAL | 3 refills | Status: DC

## 2018-02-01 MED ORDER — LISINOPRIL 20 MG PO TABS: 20.0000 mg | ORAL_TABLET | Freq: Every day | ORAL | 3 refills | Status: DC

## 2018-02-01 MED ORDER — CLOPIDOGREL BISULFATE 75 MG PO TABS: 75.0000 mg | ORAL_TABLET | Freq: Every day | ORAL | 3 refills | Status: DC

## 2018-02-01 MED ORDER — CARVEDILOL 25 MG PO TABS: 25.0000 mg | ORAL_TABLET | Freq: Two times a day (BID) | ORAL | 3 refills | Status: DC

## 2018-02-01 NOTE — Progress Notes (Signed)
Cardiology Office Note   Date:  02/01/2018   ID:  Edward Powell, DOB 08-20-1970, MRN [id number]  PCP:  [name], [name], MD  Cardiologist:   [name], MD   Chief Complaint  Patient presents with  . OTHER    F/u CABG c/o chest/back/leg pain. Meds reviewed verbally with pt.      History of Present Illness: Edward SpeckingDarin W Powell is a 47 y.o. male who presents for follow-up visit regarding coronary artery disease status post recent non-ST elevation myocardial infarction and CABG. He has known history of tobacco use, excessive alcohol use and untreated hypertension.  He had a recent 60 pound weight gain over 1 year.  He presented last month at [facility] with a small non-ST elevation myocardial infarction in the setting of severely elevated blood pressure.  Cardiac catheterization showed severe three-vessel coronary artery disease with normal ejection fraction. Echocardiogram showed an EF of 55 to 60%, grade 1 diastolic dysfunction and no significant valvular abnormalities.  The patient underwent CABG on July 24 with LIMA to LAD, sequential SVG to ramus and distal left circumflex and SVG to right PDA. He has been doing well since then with significant improvement in dyspnea.  No chest tightness.  He continues to have significant pain at the surgical inches incision site.  He improved his diet significantly and lost 20 pounds.  He is taking his medications regularly.    Past Medical History:  Diagnosis Date  . Alcohol abuse   . Hypertension    a. Untreated - had lost wt @ one point and BP improved.  . Tobacco abuse     Past Surgical History:  Procedure Laterality Date  . CORONARY ARTERY BYPASS GRAFT N/A 01/10/2018   Procedure: CORONARY ARTERY BYPASS GRAFTING (CABG);  Surgeon: Delight OvensGerhardt, Edward B, MD;  [facility];  Service: Open Heart Surgery;  Laterality: N/A;  Times 4 using left internal mammary artery to the LAD and endoscopically harvested left saphenous vein to OM1, OM2, and PLB  .  LEFT HEART CATH AND CORONARY ANGIOGRAPHY N/A 01/09/2018   Procedure: LEFT HEART CATH AND CORONARY ANGIOGRAPHY;  Surgeon: Yvonne KendallEnd, Christopher, MD;  Location: [facility] INVASIVE CV LAB;  Service: Cardiovascular;  Laterality: N/A;  . NO PAST SURGERIES    . TEE WITHOUT CARDIOVERSION N/A 01/10/2018   Procedure: TRANSESOPHAGEAL ECHOCARDIOGRAM (TEE);  Surgeon: Delight OvensGerhardt, Edward B, MD;  [facility];  Service: Open Heart Surgery;  Laterality: N/A;     Current Outpatient Medications  Medication Sig Dispense Refill  . acetaminophen (TYLENOL) 325 MG tablet Take 2 tablets (650 mg total) by mouth every 6 (six) hours as needed for mild pain (or Fever >/= 101).    Marland Kitchen. aspirin EC 81 MG EC tablet Take 1 tablet (81 mg total) by mouth daily.    Marland Kitchen. atorvastatin (LIPITOR) 80 MG tablet Take 1 tablet (80 mg total) by mouth daily at 6 PM. 30 tablet 1  . carvedilol (COREG) 25 MG tablet Take 1 tablet (25 mg total) by mouth 2 (two) times daily with a meal. 60 tablet 1  . clopidogrel (PLAVIX) 75 MG tablet Take 1 tablet (75 mg total) by mouth daily. 30 tablet 1  . ipratropium-albuterol (DUONEB) 0.5-2.5 (3) MG/3ML SOLN Take 3 mLs by nebulization every 6 (six) hours. 360 mL 0  . lisinopril (PRINIVIL,ZESTRIL) 20 MG tablet Take 1 tablet (20 mg total) by mouth daily. 30 tablet 1   No current facility-administered medications for this visit.     Allergies:  Patient has no known allergies.    Social History:  The patient  reports that he has quit smoking. He has never used smokeless tobacco. He reports that he drank about 15.0 - 20.0 standard drinks of alcohol per week. He reports that he has current or past drug history.   Family History:  The patient's family history includes Atrial fibrillation in his father; Breast cancer in his mother; Hypertension in his father and mother; Liver cancer in his father; Lung cancer in his father.    ROS:  Please see the history of present illness.   Otherwise, review of systems are positive for  none.   All other systems are reviewed and negative.    PHYSICAL EXAM: VS:  BP (!) 125/94 (BP Location: Left Arm, Patient Position: Sitting, Cuff Size: Normal)   Pulse 72   Ht 5\' 8"  (1.727 m)   Wt 201 lb 8 oz (91.4 kg)   BMI 30.64 kg/m  , BMI Body mass index is 30.64 kg/m. GEN: Well nourished, well developed, in no acute distress  HEENT: normal  Neck: no JVD, carotid bruits, or masses Cardiac: RRR; no murmurs, rubs, or gallops,no edema  Respiratory:  clear to auscultation bilaterally, normal work of breathing GI: soft, nontender, nondistended, + BS MS: no deformity or atrophy  Skin: warm and dry, no rash Neuro:  Strength and sensation are intact Psych: euthymic mood, full affect   EKG:  EKG is ordered today. The ekg ordered today demonstrates normal sinus rhythm with nonspecific T wave changes.   Recent Labs: 01/09/2018: ALT 103 01/14/2018: Hemoglobin 11.1; Platelets 147 01/15/2018: BUN 13; Creatinine, Ser 0.88; Magnesium 2.1; Potassium 3.8; Sodium 137    Lipid Panel    Component Value Date/Time   CHOL 318 (H) 01/09/2018 0510   TRIG 265 (H) 01/09/2018 0510   HDL 58 01/09/2018 0510   CHOLHDL 5.5 01/09/2018 0510   VLDL 53 (H) 01/09/2018 0510   LDLCALC 207 (H) 01/09/2018 0510      Wt Readings from Last 3 Encounters:  02/01/18 201 lb 8 oz (91.4 kg)  01/15/18 221 lb 6.4 oz (100.4 kg)  01/09/18 224 lb (101.6 kg)        No flowsheet data found.    ASSESSMENT AND PLAN:  1.  Coronary artery disease involving native coronary arteries with recent myocardial infarction followed by CABG for severe three-vessel coronary artery disease: He is regressing very well with no signs of volume overload.  I agree with dual antiplatelet therapy given his myocardial infarction. I encouraged him to attend cardiac rehab which can likely be started next month.  2.  Hyperlipidemia: He has severe mixed hyperlipidemia with LDL above 200 and triglyceride above 250.  He has improved his  lifestyle and diet significantly and was able to lose weight.  Continue high-dose atorvastatin.  Repeat fasting lipid and liver profile in a month from now.  3.  Essential hypertension: Blood pressure improved significantly on current medications.  4.  Obesity: The patient lost 23 pounds since last month and he has been following very healthy diet.  He is determined to lose more weight.    Disposition:   FU with me in 1 month  Signed,  Lorine BearsMuhammad Sophronia Varney, MD  02/01/2018 3:25 PM    [facility]

## 2018-02-01 NOTE — Patient Instructions (Addendum)
Medication Instructions: Your physician recommends that you continue on your current medications as directed. Please refer to the Current Medication list given to you today.  If you need a refill on your cardiac medications before your next appointment, please call your pharmacy.   Labwork: Your provider would like for you to return in one month (same day as your follow up appointment) to have the following labs drawn: FASTING lipid and liver.   Follow-Up: Your physician wants you to follow-up in one month with Dr. Kirke CorinArida.   Thank you for choosing [facility] at [facility]!

## 2018-02-02 ENCOUNTER — Telehealth: Payer: Self-pay | Admitting: Cardiovascular Disease

## 2018-02-02 ENCOUNTER — Other Ambulatory Visit: Payer: Self-pay | Admitting: *Deleted

## 2018-02-02 DIAGNOSIS — E781 Pure hyperglyceridemia: Secondary | ICD-10-CM

## 2018-02-02 MED ORDER — CARVEDILOL 25 MG PO TABS: 25.0000 mg | ORAL_TABLET | Freq: Two times a day (BID) | ORAL | 3 refills | Status: DC

## 2018-02-02 MED ORDER — CLOPIDOGREL BISULFATE 75 MG PO TABS: 75.0000 mg | ORAL_TABLET | Freq: Every day | ORAL | 3 refills | Status: DC

## 2018-02-02 MED ORDER — ATORVASTATIN CALCIUM 80 MG PO TABS: 80.0000 mg | ORAL_TABLET | Freq: Every day | ORAL | 3 refills | Status: DC

## 2018-02-02 MED ORDER — LISINOPRIL 20 MG PO TABS: 20.0000 mg | ORAL_TABLET | Freq: Every day | ORAL | 3 refills | Status: DC

## 2018-02-02 NOTE — Telephone Encounter (Signed)
Requested Prescriptions   Signed Prescriptions Disp Refills  . atorvastatin (LIPITOR) 80 MG tablet 90 tablet 3    Sig: Take 1 tablet (80 mg total) by mouth daily at 6 PM.    Authorizing Provider: ARIDA, MUHAMMAD A    Ordering User: Bonne Whack C  . carvedilol (COREG) 25 MG tablet 180 tablet 3    Sig: Take 1 tablet (25 mg total) by mouth 2 (two) times daily with a meal.    Authorizing Provider: ARIDA, MUHAMMAD A    Ordering User: Amara Manalang C  . clopidogrel (PLAVIX) 75 MG tablet 90 tablet 3    Sig: Take 1 tablet (75 mg total) by mouth daily.    Authorizing Provider: ARIDA, MUHAMMAD A    Ordering User: Anthonia Monger C  . lisinopril (PRINIVIL,ZESTRIL) 20 MG tablet 90 tablet 3    Sig: Take 1 tablet (20 mg total) by mouth daily.    Authorizing Provider: ARIDA, MUHAMMAD A    Ordering User: Zyanya Glaza C    

## 2018-02-02 NOTE — Telephone Encounter (Signed)
°*  STAT* If patient is at the pharmacy, call can be transferred to refill team.   1. Which medications need to be refilled? (please list name of each medication and dose if known)     All of them sent on 8/15 did not go through per [facility]  2. Which pharmacy/location (including street and city if local pharmacy) is medication to be sent to? [facility] [place]   3. Do they need a 30 day or 90 day supply?90

## 2018-02-02 NOTE — Telephone Encounter (Signed)
Requested Prescriptions   Signed Prescriptions Disp Refills  . atorvastatin (LIPITOR) 80 MG tablet 90 tablet 3    Sig: Take 1 tablet (80 mg total) by mouth daily at 6 PM.    Authorizing Provider: Lorine BearsARIDA, MUHAMMAD A    Ordering User: Bodi Palmeri C  . carvedilol (COREG) 25 MG tablet 180 tablet 3    Sig: Take 1 tablet (25 mg total) by mouth 2 (two) times daily with a meal.    Authorizing Provider: Lorine BearsARIDA, MUHAMMAD A    Ordering User: Quavion Boule C  . clopidogrel (PLAVIX) 75 MG tablet 90 tablet 3    Sig: Take 1 tablet (75 mg total) by mouth daily.    Authorizing Provider: Lorine BearsARIDA, MUHAMMAD A    Ordering User: Shawnie DapperLOPEZ, Editha Bridgeforth C  . lisinopril (PRINIVIL,ZESTRIL) 20 MG tablet 90 tablet 3    Sig: Take 1 tablet (20 mg total) by mouth daily.    Authorizing Provider: Lorine BearsARIDA, MUHAMMAD A    Ordering User: Kendrick FriesLOPEZ, Zakiya Sporrer C

## 2018-02-16 ENCOUNTER — Other Ambulatory Visit: Payer: Self-pay | Admitting: Cardiothoracic Surgery

## 2018-02-16 DIAGNOSIS — Z951 Presence of aortocoronary bypass graft: Secondary | ICD-10-CM

## 2018-02-20 ENCOUNTER — Ambulatory Visit
Admission: RE | Admit: 2018-02-20 | Discharge: 2018-02-20 | Disposition: A | Discharge status: Home / Self Care | Payer: BLUE CROSS/BLUE SHIELD | Source: Ambulatory Visit | Attending: Cardiothoracic Surgery | Admitting: Cardiothoracic Surgery

## 2018-02-20 ENCOUNTER — Ambulatory Visit (INDEPENDENT_AMBULATORY_CARE_PROVIDER_SITE_OTHER): Payer: Self-pay | Admitting: Physician Assistant

## 2018-02-20 ENCOUNTER — Other Ambulatory Visit: Payer: Self-pay

## 2018-02-20 VITALS — BP 90/60 | HR 82 | Resp 18 | Ht 68.0 in | Wt 197.0 lb

## 2018-02-20 DIAGNOSIS — I25119 Atherosclerotic heart disease of native coronary artery with unspecified angina pectoris: Secondary | ICD-10-CM

## 2018-02-20 DIAGNOSIS — Z951 Presence of aortocoronary bypass graft: Secondary | ICD-10-CM

## 2018-02-20 IMAGING — DX DG CHEST 2V
2 series · 2 of 2 positions shown · non-contrast
Comparison: [DATE]

CLINICAL DATA: Post CABG on [DATE], hypertension

EXAM:
CHEST - 2 VIEW

[dg chest 2 view (1 of 2)]
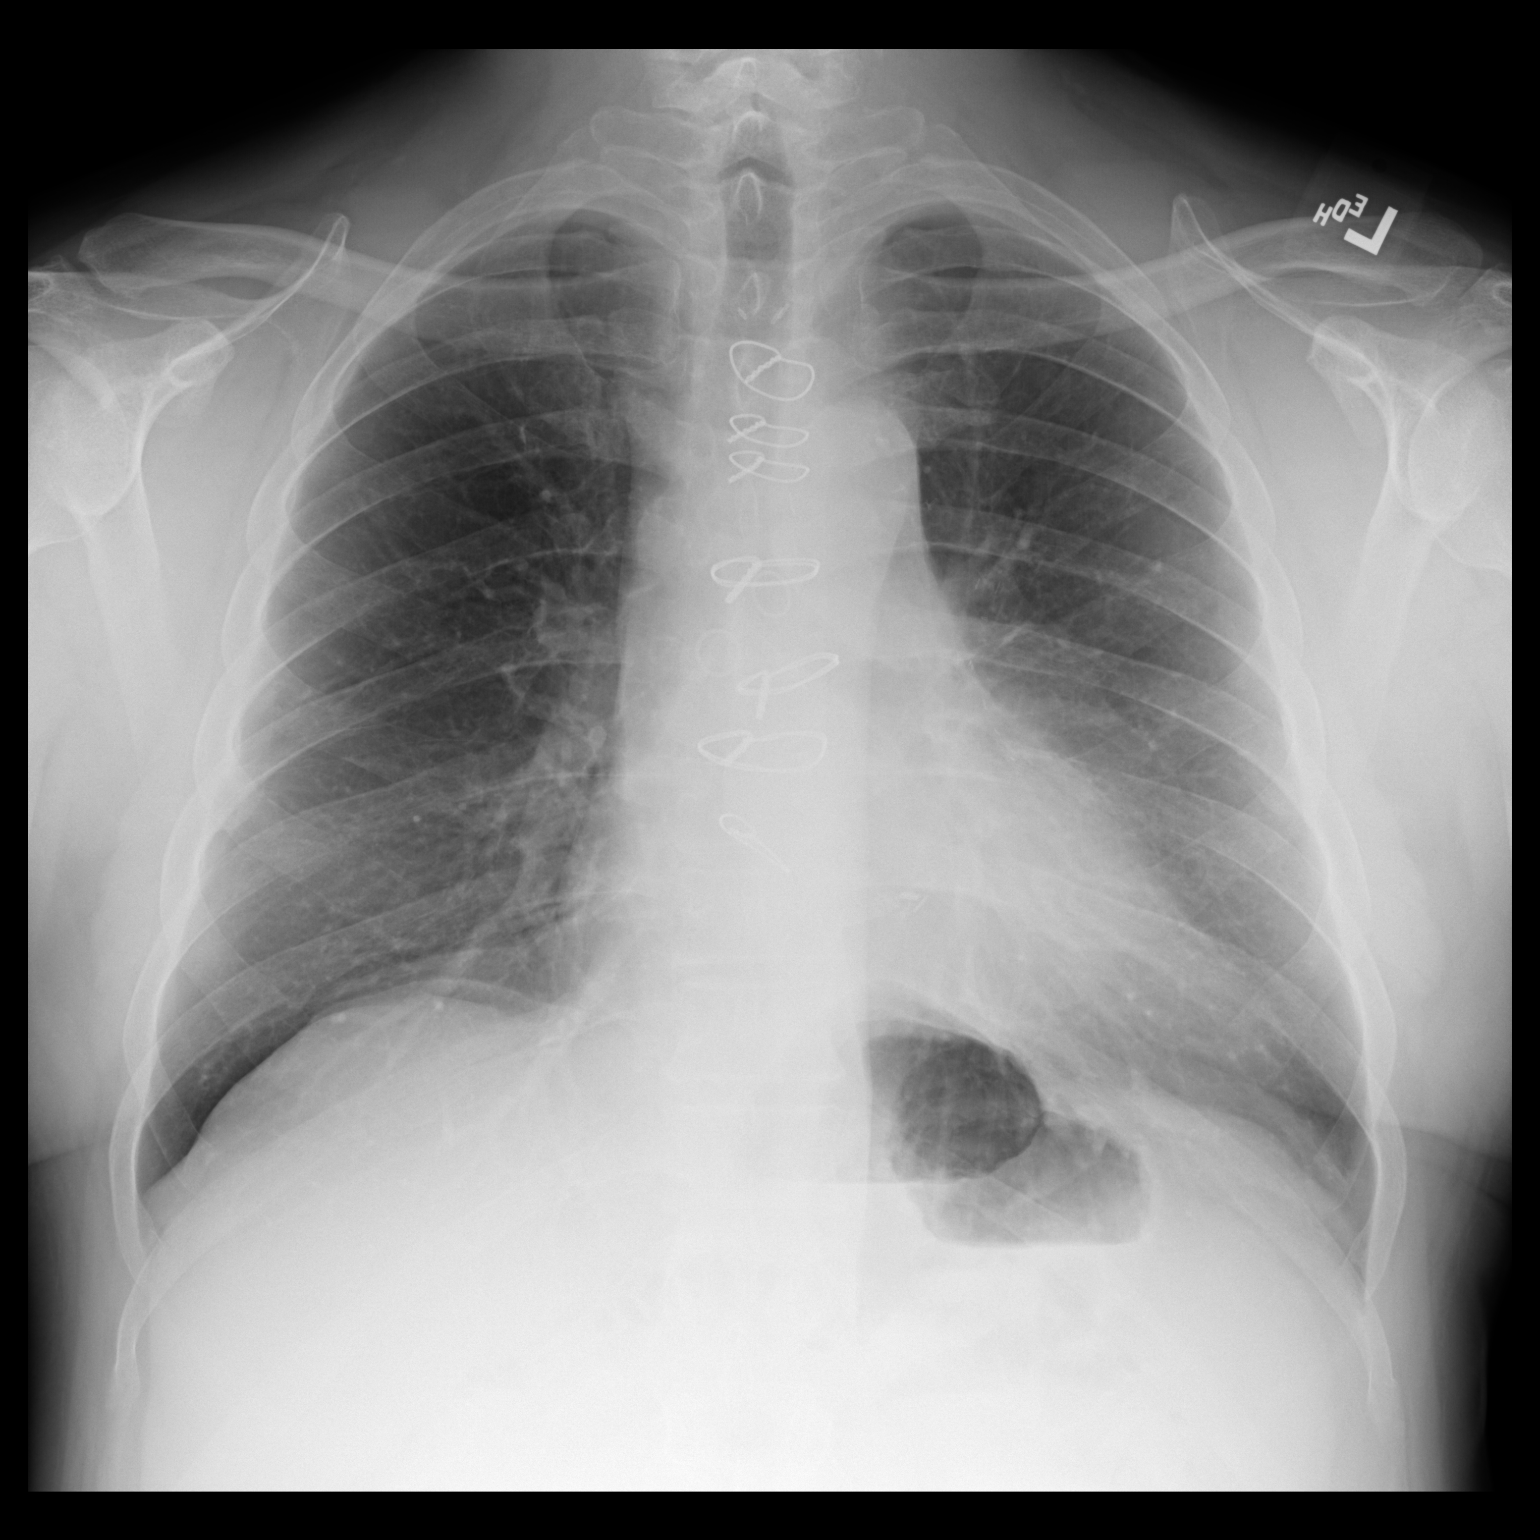

[dg chest 2 view (2 of 2)]
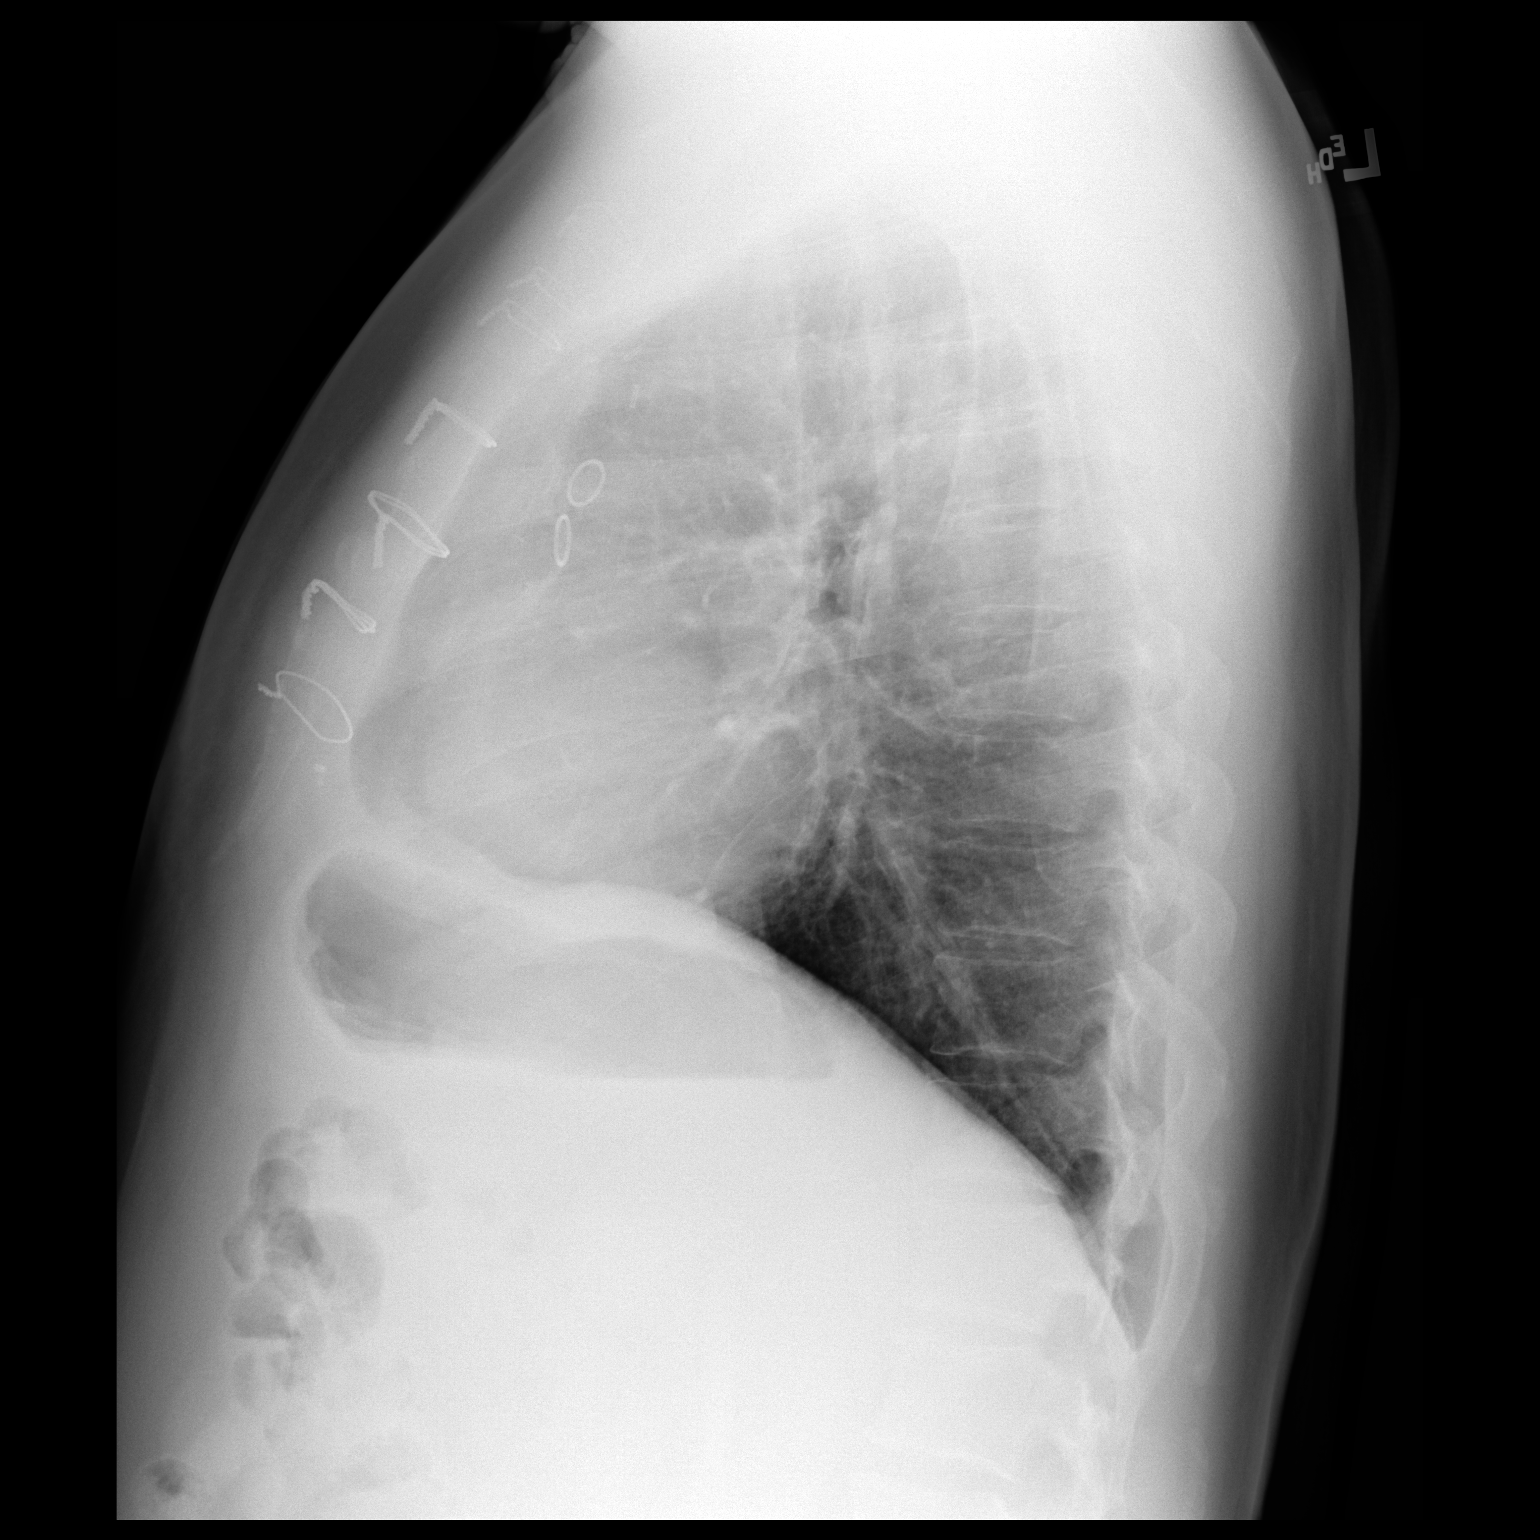

[2 of 2 positions shown; findings below may reference images not displayed]

FINDINGS: Normal heart size post CABG.

Mediastinal contours and pulmonary vascularity normal.

Lungs clear.

No pulmonary infiltrate, pleural effusion, or pneumothorax.

Improved aeration at LEFT base versus prior study.

Osseous structures unremarkable.
IMPRESSION: Post CABG.

No acute abnormalities.

## 2018-02-20 MED ORDER — LISINOPRIL 10 MG PO TABS: 10.0000 mg | ORAL_TABLET | Freq: Every day | ORAL | 3 refills | Status: DC

## 2018-02-20 NOTE — Progress Notes (Signed)
HPI:  Patient returns for routine postoperative follow-up having undergone CABG x 4 on 01/10/2018.  The patient's early postoperative recovery while in the hospital was notable for hypertension for which his medications were titrated for.  Since hospital discharge the patient reports he is doing very well.  He is walking without any difficulty.  He states his incisions have healed very well.  He does admit to sexual dysfunction which is new for him since he had his surgery.  He questions if this will improve.  Current Outpatient Medications  Medication Sig Dispense Refill  . acetaminophen (TYLENOL) 325 MG tablet Take 2 tablets (650 mg total) by mouth every 6 (six) hours as needed for mild pain (or Fever >/= 101).    Marland Kitchen aspirin EC 81 MG EC tablet Take 1 tablet (81 mg total) by mouth daily.    Marland Kitchen atorvastatin (LIPITOR) 80 MG tablet Take 1 tablet (80 mg total) by mouth daily at 6 PM. 90 tablet 3  . carvedilol (COREG) 25 MG tablet Take 1 tablet (25 mg total) by mouth 2 (two) times daily with a meal. 180 tablet 3  . clopidogrel (PLAVIX) 75 MG tablet Take 1 tablet (75 mg total) by mouth daily. 90 tablet 3  . ipratropium-albuterol (DUONEB) 0.5-2.5 (3) MG/3ML SOLN Take 3 mLs by nebulization every 6 (six) hours. 360 mL 0  . lisinopril (PRINIVIL,ZESTRIL) 10 MG tablet Take 1 tablet (10 mg total) by mouth daily. 30 tablet 3   No current facility-administered medications for this visit.     Physical Exam:  BP 90/60 (BP Location: Right Arm, Patient Position: Sitting, Cuff Size: Normal)   Pulse 82   Resp 18   Ht 5\' 8"  (1.727 m)   Wt 197 lb (89.4 kg)   SpO2 99% Comment: RA  BMI 29.95 kg/m   Gen: no apparent distress Heart: RRR Lungs: CTA bilaterally Ext: no edema present Incisions: Well healed  Diagnostic Tests:  CXR: no pneumothorax, pleural effusions, sternal wires intact   A/P:  1. S/P CABG x 4 doing very well 2. Hypotension, SBP in the 90s- will decrease Lisinopril to 10 mg daily  3.  Erectile Dysfunction- new onset since surgery.  Told patient can be a result of heart disease.  Told patient he may require a medication for awhile as his heart recovers, he will talk to Dr. Kirke Corin for this 4. RTC prn   Lowella Dandy, PA-C [facility] [phone]

## 2018-02-20 NOTE — Patient Instructions (Signed)
You may return to driving an automobile as long as you are no longer requiring oral narcotic pain relievers during the daytime.  It would be wise to start driving only short distances during the daylight and gradually increase from there as you feel comfortable.  You may continue to gradually increase your physical activity as tolerated.  Refrain from any heavy lifting or strenuous use of your arms and shoulders until at least 8 weeks from the time of your surgery, and avoid activities that cause increased pain in your chest on the side of your surgical incision.  Otherwise you may continue to increase activities without any particular limitations.  Increase the intensity and duration of physical activity gradually.   Make every effort to maintain a "heart-healthy" lifestyle with regular physical exercise and adherence to a low-fat, low-carbohydrate diet.  Continue to seek regular follow-up appointments with your primary care physician and/or cardiologist.  

## 2018-03-06 ENCOUNTER — Other Ambulatory Visit (INDEPENDENT_AMBULATORY_CARE_PROVIDER_SITE_OTHER): Payer: BLUE CROSS/BLUE SHIELD

## 2018-03-06 DIAGNOSIS — I10 Essential (primary) hypertension: Secondary | ICD-10-CM

## 2018-03-06 DIAGNOSIS — E782 Mixed hyperlipidemia: Secondary | ICD-10-CM | POA: Diagnosis not present

## 2018-03-07 LAB — LIPID PANEL
Chol/HDL Ratio: 4.2 ratio (ref 0.0–5.0)
Cholesterol, Total: 135 mg/dL (ref 100–199)
HDL: 32 mg/dL — AB (ref >39)
LDL CALC: 54 mg/dL (ref 0–99)
Triglycerides: 245 mg/dL — ABNORMAL HIGH (ref 0–149)
VLDL CHOLESTEROL CAL: 49 mg/dL — AB (ref 5–40)

## 2018-03-07 LAB — HEPATIC FUNCTION PANEL
ALT: 34 IU/L (ref 0–44)
AST: 25 IU/L (ref 0–40)
Albumin: 4.3 g/dL (ref 3.5–5.5)
Alkaline Phosphatase: 147 IU/L — ABNORMAL HIGH (ref 39–117)
BILIRUBIN TOTAL: 0.5 mg/dL (ref 0.0–1.2)
BILIRUBIN, DIRECT: 0.12 mg/dL (ref 0.00–0.40)
Total Protein: 6.9 g/dL (ref 6.0–8.5)

## 2018-03-08 ENCOUNTER — Ambulatory Visit (INDEPENDENT_AMBULATORY_CARE_PROVIDER_SITE_OTHER): Payer: BLUE CROSS/BLUE SHIELD | Admitting: Nurse Practitioner

## 2018-03-08 ENCOUNTER — Encounter: Payer: Self-pay | Admitting: Nurse Practitioner

## 2018-03-08 VITALS — BP 128/86 | HR 61 | Ht 68.0 in | Wt 198.2 lb

## 2018-03-08 DIAGNOSIS — I2510 Atherosclerotic heart disease of native coronary artery without angina pectoris: Secondary | ICD-10-CM | POA: Diagnosis not present

## 2018-03-08 DIAGNOSIS — R55 Syncope and collapse: Secondary | ICD-10-CM

## 2018-03-08 DIAGNOSIS — E785 Hyperlipidemia, unspecified: Secondary | ICD-10-CM

## 2018-03-08 DIAGNOSIS — I10 Essential (primary) hypertension: Secondary | ICD-10-CM

## 2018-03-08 DIAGNOSIS — Z720 Tobacco use: Secondary | ICD-10-CM | POA: Diagnosis not present

## 2018-03-08 NOTE — Progress Notes (Signed)
Office Visit    Patient Name: Ignatius SpeckingDarin W Kennerson Date of Encounter: 03/08/2018  Primary Care Provider:  [name], [name], MD Primary Cardiologist:  Lorine BearsMuhammad Arida, MD  Chief Complaint    47 y.o. Male, past history of CAD, diastolic dysfunction, hypertension, hyperlipidemia/hypertriglyceridemia, former smoker. Presents for follow up s/p CABG x 4 in July 2019.    Past Medical History    Past Medical History:  Diagnosis Date  . Alcohol abuse   . CAD (coronary artery disease)    a. 12/2017 NSTEMI/Cath: LM 50d, LAD 60ost, 3356m, RI nl, LCX 100p, OM2 fills via collats from LAD, RCA 70p, 861m, 4013m, 60d, EF 55-65%; b. 12/2017 CABG x 4: LIMA->LAD, VG->RI->LCX, VG->RPDA.  . Diastolic dysfunction    a. 12/2017 Echo: EF 55-60%, no rwma, Gr1 DD, triv MR, nl RV fxn.  . Essential hypertension   . Hyperlipidemia LDL goal <70   . Hypertriglyceridemia   . Tobacco abuse    Past Surgical History:  Procedure Laterality Date  . CORONARY ARTERY BYPASS GRAFT N/A 01/10/2018   Procedure: CORONARY ARTERY BYPASS GRAFTING (CABG);  Surgeon: Delight OvensGerhardt, Edward B, MD;  [facility];  Service: Open Heart Surgery;  Laterality: N/A;  Times 4 using left internal mammary artery to the LAD and endoscopically harvested left saphenous vein to OM1, OM2, and PLB  . LEFT HEART CATH AND CORONARY ANGIOGRAPHY N/A 01/09/2018   Procedure: LEFT HEART CATH AND CORONARY ANGIOGRAPHY;  Surgeon: Yvonne KendallEnd, Breyonna Nault, MD;  Location: [facility] INVASIVE CV LAB;  Service: Cardiovascular;  Laterality: N/A;  . NO PAST SURGERIES    . TEE WITHOUT CARDIOVERSION N/A 01/10/2018   Procedure: TRANSESOPHAGEAL ECHOCARDIOGRAM (TEE);  Surgeon: Delight OvensGerhardt, Edward B, MD;  [facility];  Service: Open Heart Surgery;  Laterality: N/A;    Allergies  No Known Allergies  History of Present Illness    Mr. Jed LimerickCross is a 47 y.o. Male with a history of CAD, diastolic dysfunction, hypertension, hyperlipidemia/hypertriglyceridemia. He presented to [facility] in July with chest pain  and positive troponin's. He was ruled a NSTEMI and underwent heart catheterization that showed multivessel disease requiring subsequent CABG x 4; LIMA->LAD, VG->RI->LCX, VG->RPDA. Prior to this he had a history of tobacco and alcohol use, uncontrolled hypertension, mixed hyperlipidemia with and LDL on admission of 207 and triglycerides of 265. Following discharge he had been doing well. He has stopped smoking completely, and reports only occasional alcohol use. He is following a low fat, low sodium, low carb diet; and reports only going out to eat a handful of times since discharge. His weight is down 23 lbs since discharge. He reports tolerating activity well, and walks a few days a week. He doesn't think that cardiac rehab will work well w/ his work schedule.  He denies chest pain, SOB, DOE, palpitations, or edema. He still c/o sternal tenderness and difficulty laying on his side to sleep due to chest wall discomfort. He has reported three syncopal episodes in the last few weeks and was seen by CT surgery two weeks ago who decreased his lisinopril down to 10mg  due to hypotension with SBP in the 90's. Since then he has reported one syncopal episode that occurred when first waking up and getting out of bed. Denies feeling dizzy, lightheaded, chest pain or palpitations prior to the fall. Fortunately, he has not suffered any injuries.  Home Medications    Prior to Admission medications   Medication Sig Start Date End Date Taking? Authorizing Provider  acetaminophen (TYLENOL) 325 MG tablet  Take 2 tablets (650 mg total) by mouth every 6 (six) hours as needed for mild pain (or Fever >/= 101). 01/09/18   Alford Highland, MD  aspirin EC 81 MG EC tablet Take 1 tablet (81 mg total) by mouth daily. 01/09/18   Alford Highland, MD  atorvastatin (LIPITOR) 80 MG tablet Take 1 tablet (80 mg total) by mouth daily at 6 PM. 02/02/18   Iran Ouch, MD  carvedilol (COREG) 25 MG tablet Take 1 tablet (25 mg total) by mouth  2 (two) times daily with a meal. 02/02/18   Iran Ouch, MD  clopidogrel (PLAVIX) 75 MG tablet Take 1 tablet (75 mg total) by mouth daily. 02/02/18   Iran Ouch, MD  ipratropium-albuterol (DUONEB) 0.5-2.5 (3) MG/3ML SOLN Take 3 mLs by nebulization every 6 (six) hours. 01/25/18   Delight Ovens, MD  lisinopril (PRINIVIL,ZESTRIL) 10 MG tablet Take 1 tablet (10 mg total) by mouth daily. 02/20/18   Barrett, Rae Roam, PA-C    Review of Systems    Denies chest pain, SOB, DOE, palpitations or edema. Does report three syncopal episodes in the last month, but denies dizziness, light headedness or palpitations prior to episodes. 2/3 of episodes occurred in the setting of position changes.  All other systems reviewed and are otherwise negative except as noted above.  Physical Exam    VS:  BP 128/86 (BP Location: Left Arm, Patient Position: Sitting, Cuff Size: Large)   Pulse 61   Ht 5\' 8"  (1.727 m)   Wt 198 lb 4 oz (89.9 kg)   BMI 30.14 kg/m  , BMI Body mass index is 30.14 kg/m.  Orthostatic VS for the past 24 hrs:  BP- Lying Pulse- Lying BP- Sitting Pulse- Sitting BP- Standing at 0 minutes Pulse- Standing at 0 minutes  03/08/18 1424 (!) 162/107 64 (!) 162/117 72 (!) 158/119 69    GEN: Well nourished, well developed, in no acute distress. HEENT: normal. Neck: Supple, no JVD, carotid bruits, or masses. Cardiac: RRR, no murmurs, rubs, or gallops. No clubbing, cyanosis, edema.  Radials/DP/PT 2+ and equal bilaterally. Midline sternal incision well healed, small area of tenderness remains over left mid sternum.  Respiratory:  Respirations regular and unlabored, clear to auscultation bilaterally. GI: Soft, nontender, nondistended, BS + x 4. MS: no deformity or atrophy. Skin: warm and dry, no rash. Neuro:  Strength and sensation are intact. Psych: Normal affect.  Accessory Clinical Findings    ECG personally reviewed by me today - NSR, rate 61 - no acute changes.  Assessment & Plan      1.  CAD: s/p CABG x 4, LIMA->LAD, VG->RI->LCX, VG->RPDA in July 2019. Recovering well. No reported angina, SOB, DOE, or edema. Three syncopal episodes in the last month, one of which was after down titration of lisinopril. Currently on statin, beta blocker, ACEI, Plavix and Asprin. Compliant with medication regimen. No reported side effects. Will continue current treatment and encouraged continued lifestyle modifications and reduction in alcohol use.   2. Syncope: Three reported syncopal episodes in last month. Two of them occurred going from sitting to standing positions, one occurred while standing still. Denies dizziness, light headedness, or palpitations prior to events. Lisinopril had been titrated down by CT surgery at last visit in the setting of soft bp's.  He says that pressures were freq in the 90's prior to down-titration of lisinopril.  He has had one episode since, occurring after getting out of bed, fairly abruptly.  No trauma/injury. Orthostatics  performed in office today, did not show andy significant drop in BP with position changes.  HR stable.  Though I suspect that syncope is 2/2 orthostasis in the setting of getting up from bed abruptly, given recent MI and CABG, I did offer event monitoring to assess for arrhythmia.  Pt wishes to defer @ this time.  He will contact us if he has any recurrent presyncope or syncope.  I did advise that he change positions more cautiously and he verbalized understanding.  3. Hypertension: BP well controlled on beta blocker and ACEI, 128/86 today. Higher when we checked orthostatics.  Will need to monitor for continued low BP's at home due to syncope. Asked to record home BP and report and extreme highs or lows to office.   4. Hyperlipidemia/Hypertriglyceridemia: LDL levels dramatically improved with statin therapy, down to 54. Triglycerides remain elevated at 245. Discussed the option to add Vescepa given recent ACS, HL, and ongoing TG >150.  Pt would like  to hold off for now and would like to continue lifestyle changes and read up on drug himself first. Will continue atorvastatin at current dose.  I rec that he read some literature re: benefits of vascepa in the REDUCE-IT trial.  5. Tob Abuse:  Quit in July.  Continued cigarette avoidance encouraged.  6. ETOH use:  He has cut way back. Was frequently binging on vodka.  Now rarely has a drink but isn't ready to give it up altogether.  Cessation advised.   7.  Disposition: F/u in 3 mos or sooner if necessary.   Nicolasa Ducking, NP 03/08/2018, 3:30 PM

## 2018-03-08 NOTE — Patient Instructions (Signed)
Medication Instructions:  Your physician recommends that you continue on your current medications as directed. Please refer to the Current Medication list given to you today.   Labwork: none  Testing/Procedures: none  Follow-Up: Your physician recommends that you schedule a follow-up appointment in: 3 MONTHS WITH DR ARIDA.  If you need a refill on your cardiac medications before your next appointment, please call your pharmacy.   

## 2018-03-09 ENCOUNTER — Ambulatory Visit: Payer: BLUE CROSS/BLUE SHIELD | Admitting: Nurse Practitioner

## 2018-04-20 ENCOUNTER — Ambulatory Visit: Payer: Self-pay | Admitting: Family Medicine

## 2018-05-10 ENCOUNTER — Telehealth: Payer: Self-pay | Admitting: *Deleted

## 2018-05-10 ENCOUNTER — Ambulatory Visit (INDEPENDENT_AMBULATORY_CARE_PROVIDER_SITE_OTHER): Payer: BLUE CROSS/BLUE SHIELD | Admitting: Family Medicine

## 2018-05-10 ENCOUNTER — Encounter: Payer: Self-pay | Admitting: Family Medicine

## 2018-05-10 VITALS — BP 124/76 | HR 70 | Temp 97.9°F | Ht 67.13 in | Wt 200.4 lb

## 2018-05-10 DIAGNOSIS — D649 Anemia, unspecified: Secondary | ICD-10-CM | POA: Diagnosis not present

## 2018-05-10 DIAGNOSIS — R748 Abnormal levels of other serum enzymes: Secondary | ICD-10-CM

## 2018-05-10 DIAGNOSIS — R079 Chest pain, unspecified: Secondary | ICD-10-CM | POA: Diagnosis not present

## 2018-05-10 DIAGNOSIS — I25119 Atherosclerotic heart disease of native coronary artery with unspecified angina pectoris: Secondary | ICD-10-CM

## 2018-05-10 DIAGNOSIS — Z951 Presence of aortocoronary bypass graft: Secondary | ICD-10-CM | POA: Diagnosis not present

## 2018-05-10 DIAGNOSIS — E785 Hyperlipidemia, unspecified: Secondary | ICD-10-CM | POA: Diagnosis not present

## 2018-05-10 DIAGNOSIS — I517 Cardiomegaly: Secondary | ICD-10-CM

## 2018-05-10 DIAGNOSIS — E669 Obesity, unspecified: Secondary | ICD-10-CM

## 2018-05-10 NOTE — Assessment & Plan Note (Signed)
Doing well; DAPT, high dose statin; on beta-blocker

## 2018-05-10 NOTE — Telephone Encounter (Addendum)
Left a message for the patient to call back.  Per Dr. Kirke CorinArida, the patient will need to be scheduled for a treadmill myoview.
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
## 2018-05-10 NOTE — Patient Instructions (Addendum)
Try to limit saturated fats in your diet (bologna, hot dogs, barbeque, cheeseburgers, hamburgers, steak, bacon, sausage, cheese, etc.) and get more fresh fruits, vegetables, and whole grains  Our next goal weight is 186 pounds over the next several months  Try to follow the DASH guidelines (DASH stands for Dietary Approaches to Stop Hypertension). Try to limit the sodium in your diet to no more than 1,500mg  of sodium per day. Certainly try to not exceed 2,000 mg per day at the very most. Do not add salt when cooking or at the table.  Check the sodium amount on labels when shopping, and choose items lower in sodium when given a choice. Avoid or limit foods that already contain a lot of sodium. Eat a diet rich in fruits and vegetables and whole grains, and try to lose weight if overweight or obese   DASH Eating Plan DASH stands for "Dietary Approaches to Stop Hypertension." The DASH eating plan is a healthy eating plan that has been shown to reduce high blood pressure (hypertension). It may also reduce your risk for type 2 diabetes, heart disease, and stroke. The DASH eating plan may also help with weight loss. What are tips for following this plan? General guidelines  Avoid eating more than 2,300 mg (milligrams) of salt (sodium) a day. If you have hypertension, you may need to reduce your sodium intake to 1,500 mg a day.  Limit alcohol intake to no more than 1 drink a day for nonpregnant women and 2 drinks a day for men. One drink equals 12 oz of beer, 5 oz of wine, or 1 oz of hard liquor.  Work with your health care provider to maintain a healthy body weight or to lose weight. Ask what an ideal weight is for you.  Get at least 30 minutes of exercise that causes your heart to beat faster (aerobic exercise) most days of the week. Activities may include walking, swimming, or biking.  Work with your health care provider or diet and nutrition specialist (dietitian) to adjust your eating plan to your  individual calorie needs. Reading food labels  Check food labels for the amount of sodium per serving. Choose foods with less than 5 percent of the Daily Value of sodium. Generally, foods with less than 300 mg of sodium per serving fit into this eating plan.  To find whole grains, look for the word "whole" as the first word in the ingredient list. Shopping  Buy products labeled as "low-sodium" or "no salt added."  Buy fresh foods. Avoid canned foods and premade or frozen meals. Cooking  Avoid adding salt when cooking. Use salt-free seasonings or herbs instead of table salt or sea salt. Check with your health care provider or pharmacist before using salt substitutes.  Do not fry foods. Cook foods using healthy methods such as baking, boiling, grilling, and broiling instead.  Cook with heart-healthy oils, such as olive, canola, soybean, or sunflower oil. Meal planning   Eat a balanced diet that includes: ? 5 or more servings of fruits and vegetables each day. At each meal, try to fill half of your plate with fruits and vegetables. ? Up to 6-8 servings of whole grains each day. ? Less than 6 oz of lean meat, poultry, or fish each day. A 3-oz serving of meat is about the same size as a deck of cards. One egg equals 1 oz. ? 2 servings of low-fat dairy each day. ? A serving of nuts, seeds, or beans 5 times  each week. ? Heart-healthy fats. Healthy fats called Omega-3 fatty acids are found in foods such as flaxseeds and coldwater fish, like sardines, salmon, and mackerel.  Limit how much you eat of the following: ? Canned or prepackaged foods. ? Food that is high in trans fat, such as fried foods. ? Food that is high in saturated fat, such as fatty meat. ? Sweets, desserts, sugary drinks, and other foods with added sugar. ? Full-fat dairy products.  Do not salt foods before eating.  Try to eat at least 2 vegetarian meals each week.  Eat more home-cooked food and less restaurant,  buffet, and fast food.  When eating at a restaurant, ask that your food be prepared with less salt or no salt, if possible. What foods are recommended? The items listed may not be a complete list. Talk with your dietitian about what dietary choices are best for you. Grains Whole-grain or whole-wheat bread. Whole-grain or whole-wheat pasta. Brown rice. [place]. Bulgur. Whole-grain and low-sodium cereals. Pita bread. Low-fat, low-sodium crackers. Whole-wheat flour tortillas. Vegetables Fresh or frozen vegetables (raw, steamed, roasted, or grilled). Low-sodium or reduced-sodium tomato and vegetable juice. Low-sodium or reduced-sodium tomato sauce and tomato paste. Low-sodium or reduced-sodium canned vegetables. Fruits All fresh, dried, or frozen fruit. Canned fruit in natural juice (without added sugar). Meat and other protein foods Skinless chicken or [place]. Ground chicken or [place]. Pork with fat trimmed off. Fish and seafood. Egg whites. Dried beans, peas, or lentils. Unsalted nuts, nut butters, and seeds. Unsalted canned beans. Lean cuts of beef with fat trimmed off. Low-sodium, lean deli meat. Dairy Low-fat (1%) or fat-free (skim) milk. Fat-free, low-fat, or reduced-fat cheeses. Nonfat, low-sodium ricotta or cottage cheese. Low-fat or nonfat yogurt. Low-fat, low-sodium cheese. Fats and oils Soft margarine without trans fats. Vegetable oil. Low-fat, reduced-fat, or light mayonnaise and salad dressings (reduced-sodium). Canola, safflower, olive, soybean, and sunflower oils. Avocado. Seasoning and other foods Herbs. Spices. Seasoning mixes without salt. Unsalted popcorn and pretzels. Fat-free sweets. What foods are not recommended? The items listed may not be a complete list. Talk with your dietitian about what dietary choices are best for you. Grains Baked goods made with fat, such as croissants, muffins, or some breads. Dry pasta or rice meal packs. Vegetables Creamed or fried  vegetables. Vegetables in a cheese sauce. Regular canned vegetables (not low-sodium or reduced-sodium). Regular canned tomato sauce and paste (not low-sodium or reduced-sodium). Regular tomato and vegetable juice (not low-sodium or reduced-sodium). Angie Fava. Olives. Fruits Canned fruit in a light or heavy syrup. Fried fruit. Fruit in cream or butter sauce. Meat and other protein foods Fatty cuts of meat. Ribs. Fried meat. Berniece Salines. Sausage. Bologna and other processed lunch meats. Salami. Fatback. Hotdogs. Bratwurst. Salted nuts and seeds. Canned beans with added salt. Canned or smoked fish. Whole eggs or egg yolks. Chicken or [place] with skin. Dairy Whole or 2% milk, cream, and half-and-half. Whole or full-fat cream cheese. Whole-fat or sweetened yogurt. Full-fat cheese. Nondairy creamers. Whipped toppings. Processed cheese and cheese spreads. Fats and oils Butter. Stick margarine. Lard. Shortening. Ghee. Bacon fat. Tropical oils, such as coconut, palm kernel, or palm oil. Seasoning and other foods Salted popcorn and pretzels. Onion salt, garlic salt, seasoned salt, table salt, and sea salt. Worcestershire sauce. Tartar sauce. Barbecue sauce. Teriyaki sauce. Soy sauce, including reduced-sodium. Steak sauce. Canned and packaged gravies. Fish sauce. Oyster sauce. Cocktail sauce. Horseradish that you find on the shelf. Ketchup. Mustard. Meat flavorings and tenderizers. Bouillon cubes. Hot sauce  and Tabasco sauce. Premade or packaged marinades. Premade or packaged taco seasonings. Relishes. Regular salad dressings. Where to find more information:  National Heart, Lung, and Blood Institute: PopSteam.iswww.nhlbi.nih.gov  American Heart Association: www.heart.org Summary  The DASH eating plan is a healthy eating plan that has been shown to reduce high blood pressure (hypertension). It may also reduce your risk for type 2 diabetes, heart disease, and stroke.  With the DASH eating plan, you should limit salt (sodium)  intake to 2,300 mg a day. If you have hypertension, you may need to reduce your sodium intake to 1,500 mg a day.  When on the DASH eating plan, aim to eat more fresh fruits and vegetables, whole grains, lean proteins, low-fat dairy, and heart-healthy fats.  Work with your health care provider or diet and nutrition specialist (dietitian) to adjust your eating plan to your individual calorie needs. This information is not intended to replace advice given to you by your health care provider. Make sure you discuss any questions you have with your health care provider. Document Released: 05/26/2011 Document Revised: 05/30/2016 Document Reviewed: 05/30/2016 Elsevier Interactive Patient Education  2018 [employer]  Obesity, Adult Obesity is the condition of having too much total body fat. Being overweight or obese means that your weight is greater than what is considered healthy for your body size. Obesity is determined by a measurement called BMI. BMI is an estimate of body fat and is calculated from height and weight. For adults, a BMI of 30 or higher is considered obese. Obesity can eventually lead to other health concerns and major illnesses, including:  Stroke.  Coronary artery disease (CAD).  Type 2 diabetes.  Some types of cancer, including cancers of the colon, breast, uterus, and gallbladder.  Osteoarthritis.  High blood pressure (hypertension).  High cholesterol.  Sleep apnea.  Gallbladder stones.  Infertility problems.  What are the causes? The main cause of obesity is taking in (consuming) more calories than your body uses for energy. Other factors that contribute to this condition may include:  Being born with genes that make you more likely to become obese.  Having a medical condition that causes obesity. These conditions include: ? Hypothyroidism. ? Polycystic ovarian syndrome (PCOS). ? Binge-eating disorder. ? Cushing syndrome.  Taking certain medicines, such  as steroids, antidepressants, and seizure medicines.  Not being physically active (sedentary lifestyle).  Living where there are limited places to exercise safely or buy healthy foods.  Not getting enough sleep.  What increases the risk? The following factors may increase your risk of this condition:  Having a family history of obesity.  Being a woman of African-American descent.  Being a man of Hispanic descent.  What are the signs or symptoms? Having excessive body fat is the main symptom of this condition. How is this diagnosed? This condition may be diagnosed based on:  Your symptoms.  Your medical history.  A physical exam. Your health care provider may measure: ? Your BMI. If you are an adult with a BMI between 25 and less than 30, you are considered overweight. If you are an adult with a BMI of 30 or higher, you are considered obese. ? The distances around your hips and your waist (circumferences). These may be compared to each other to help diagnose your condition. ? Your skinfold thickness. Your health care provider may gently pinch a fold of your skin and measure it.  How is this treated? Treatment for this condition often includes changing your lifestyle. Treatment  may include some or all of the following:  Dietary changes. Work with your health care provider and a dietitian to set a weight-loss goal that is healthy and reasonable for you. Dietary changes may include eating: ? Smaller portions. A portion size is the amount of a particular food that is healthy for you to eat at one time. This varies from person to person. ? Low-calorie or low-fat options. ? More whole grains, fruits, and vegetables.  Regular physical activity. This may include aerobic activity (cardio) and strength training.  Medicine to help you lose weight. Your health care provider may prescribe medicine if you are unable to lose 1 pound a week after 6 weeks of eating more healthily and doing  more physical activity.  Surgery. Surgical options may include gastric banding and gastric bypass. Surgery may be done if: ? Other treatments have not helped to improve your condition. ? You have a BMI of 40 or higher. ? You have life-threatening health problems related to obesity.  Follow these instructions at home:  Eating and drinking   Follow recommendations from your health care provider about what you eat and drink. Your health care provider may advise you to: ? Limit fast foods, sweets, and processed snack foods. ? Choose low-fat options, such as low-fat milk instead of whole milk. ? Eat 5 or more servings of fruits or vegetables every day. ? Eat at home more often. This gives you more control over what you eat. ? Choose healthy foods when you eat out. ? Learn what a healthy portion size is. ? Keep low-fat snacks on hand. ? Avoid sugary drinks, such as soda, fruit juice, iced tea sweetened with sugar, and flavored milk. ? Eat a healthy breakfast.  Drink enough water to keep your urine clear or pale yellow.  Do not go without eating for long periods of time (do not fast) or follow a fad diet. Fasting and fad diets can be unhealthy and even dangerous. Physical Activity  Exercise regularly, as told by your health care provider. Ask your health care provider what types of exercise are safe for you and how often you should exercise.  Warm up and stretch before being active.  Cool down and stretch after being active.  Rest between periods of activity. Lifestyle  Limit the time that you spend in front of your TV, computer, or video game system.  Find ways to reward yourself that do not involve food.  Limit alcohol intake to no more than 1 drink a day for nonpregnant women and 2 drinks a day for men. One drink equals 12 oz of beer, 5 oz of wine, or 1 oz of hard liquor. General instructions  Keep a weight loss journal to keep track of the food you eat and how much you  exercise you get.  Take over-the-counter and prescription medicines only as told by your health care provider.  Take vitamins and supplements only as told by your health care provider.  Consider joining a support group. Your health care provider may be able to recommend a support group.  Keep all follow-up visits as told by your health care provider. This is important. Contact a health care provider if:  You are unable to meet your weight loss goal after 6 weeks of dietary and lifestyle changes. This information is not intended to replace advice given to you by your health care provider. Make sure you discuss any questions you have with your health care provider. Document Released: 07/14/2004 Document  Revised: 11/09/2015 Document Reviewed: 03/25/2015 Elsevier Interactive Patient Education  2018 [employer].  Preventing Unhealthy [employer], Adult Staying at a healthy weight is important. When fat builds up in your body, you may become overweight or obese. These conditions put you at greater risk for developing certain health problems, such as heart disease, diabetes, sleeping problems, joint problems, and some cancers. Unhealthy weight gain is often the result of making unhealthy choices in what you eat. It is also a result of not getting enough exercise. You can make changes to your lifestyle to prevent obesity and stay as healthy as possible. What nutrition changes can be made? To maintain a healthy weight and prevent obesity:  Eat only as much as your body needs. To do this: ? Pay attention to signs that you are hungry or full. Stop eating as soon as you feel full. ? If you feel hungry, try drinking water first. Drink enough water so your urine is clear or pale yellow. ? Eat smaller portions. ? Look at serving sizes on food labels. Most foods contain more than one serving per container. ? Eat the recommended amount of calories for your gender and activity level. While most active  people should eat around 2,000 calories per day, if you are trying to lose weight or are not very active, you main need to eat less calories. Talk to your health care provider or dietitian about how many calories you should eat each day.  Choose healthy foods, such as: ? Fruits and vegetables. Try to fill at least half of your plate at each meal with fruits and vegetables. ? Whole grains, such as whole wheat bread, brown rice, and quinoa. ? Lean meats, such as chicken or fish. ? Other healthy proteins, such as beans, eggs, or tofu. ? Healthy fats, such as nuts, seeds, fatty fish, and olive oil. ? Low-fat or fat-free dairy.  Check food labels and avoid food and drinks that: ? Are high in calories. ? Have added sugar. ? Are high in sodium. ? Have saturated fats or trans fats.  Limit how much you eat of the following foods: ? Prepackaged meals. ? Fast food. ? Fried foods. ? Processed meat, such as bacon, sausage, and deli meats. ? Fatty cuts of red meat and poultry with skin.  Cook foods in healthier ways, such as by baking, broiling, or grilling.  When grocery shopping, try to shop around the outside of the store. This helps you buy mostly fresh foods and avoid canned and prepackaged foods.  What lifestyle changes can be made?  Exercise at least 30 minutes 5 or more days each week. Exercising includes brisk walking, yard work, biking, running, swimming, and team sports like basketball and soccer. Ask your health care provider which exercises are safe for you.  Do not use any products that contain nicotine or tobacco, such as cigarettes and e-cigarettes. If you need help quitting, ask your health care provider.  Limit alcohol intake to no more than 1 drink a day for nonpregnant women and 2 drinks a day for men. One drink equals 12 oz of beer, 5 oz of wine, or 1 oz of hard liquor.  Try to get 7-9 hours of sleep each night. What other changes can be made?  Keep a food and activity  journal to keep track of: ? What you ate and how many calories you had. Remember to count sauces, dressings, and side dishes. ? Whether you were active, and what exercises you did. ?  Your calorie, weight, and activity goals.  Check your weight regularly. Track any changes. If you notice you have gained weight, make changes to your diet or activity routine.  Avoid taking weight-loss medicines or supplements. Talk to your health care provider before starting any new medicine or supplement.  Talk to your health care provider before trying any new diet or exercise plan. Why are these changes important? Eating healthy, staying active, and having healthy habits not only help prevent obesity, they also:  Help you to manage stress and emotions.  Help you to connect with friends and family.  Improve your self-esteem.  Improve your sleep.  Prevent long-term health problems.  What can happen if changes are not made? Being obese or overweight can cause you to develop joint or bone problems, which can make it hard for you to stay active or do activities you enjoy. Being obese or overweight also puts stress on your heart and lungs and can lead to health problems like diabetes, heart disease, and some cancers. Where to find more information: Talk with your health care provider or a dietitian about healthy eating and healthy lifestyle choices. You may also find other information through these resources:  U.S. Department of Agriculture MyPlate: https://ball-collins.biz/  American Heart Association: www.heart.org  Centers for Disease Control and Prevention: FootballExhibition.com.br  Summary  Staying at a healthy weight is important. It helps prevent certain diseases and health problems, such as heart disease, diabetes, joint problems, sleep disorders, and some cancers.  Being obese or overweight can cause you to develop joint or bone problems, which can make it hard for you to stay active or do activities you  enjoy.  You can prevent unhealthy weight gain by eating a healthy diet, exercising regularly, not smoking, limiting alcohol, and getting enough sleep.  Talk with your health care provider or a dietitian for guidance about healthy eating and healthy lifestyle choices. This information is not intended to replace advice given to you by your health care provider. Make sure you discuss any questions you have with your health care provider. Document Released: 06/07/2016 Document Revised: 07/13/2016 Document Reviewed: 07/13/2016 Elsevier Interactive Patient Education  [employer].  I do recommend yearly flu shots; for individuals who don't want flu shots, try to practice excellent hand hygiene, and avoid nursing homes, day cares, and hospitals during peak flu season; taking additional vitamin C daily during flu/cold season may help boost your immune system too   Alcohol Use Disorder Alcohol use disorder is when your drinking disrupts your daily life. When you have this condition, you drink too much alcohol and you cannot control your drinking. Alcohol use disorder can cause serious problems with your physical health. It can affect your brain, heart, liver, pancreas, immune system, stomach, and intestines. Alcohol use disorder can increase your risk for certain cancers and cause problems with your mental health, such as depression, anxiety, psychosis, delirium, and dementia. People with this disorder risk hurting themselves and others. What are the causes? This condition is caused by drinking too much alcohol over time. It is not caused by drinking too much alcohol only one or two times. Some people with this condition drink alcohol to cope with or escape from negative life events. Others drink to relieve pain or symptoms of mental illness. What increases the risk? You are more likely to develop this condition if:  You have a family history of alcohol use disorder.  Your culture encourages  drinking to the point of intoxication, or  makes alcohol easy to get.  You had a mood or conduct disorder in childhood.  You have been a victim of abuse.  You are an adolescent and: ? You have poor grades or difficulties in school. ? Your caregivers do not talk to you about saying no to alcohol, or supervise your activities. ? You are impulsive or you have trouble with self-control.  What are the signs or symptoms? Symptoms of this condition include:  Drinkingmore than you want to.  Drinking for longer than you want to.  Trying several times to drink less or to control your drinking.  Spending a lot of time getting alcohol, drinking, or recovering from drinking.  Craving alcohol.  Having problems at work, at school, or at home due to drinking.  Having problems in relationships due to drinking.  Drinking when it is dangerous to drink, such as before driving a car.  Continuing to drink even though you know you might have a physical or mental problem related to drinking.  Needing more and more alcohol to get the same effect you want from the alcohol (building up tolerance).  Having symptoms of withdrawal when you stop drinking. Symptoms of withdrawal include: ? Fatigue. ? Nightmares. ? Trouble sleeping. ? Depression. ? Anxiety. ? Fever. ? Seizures. ? Severe confusion. ? Feeling or seeing things that are not there (hallucinations). ? Tremors. ? Rapid heart rate. ? Rapid breathing. ? High blood pressure.  Drinking to avoid symptoms of withdrawal.  How is this diagnosed? This condition is diagnosed with an assessment. Your health care provider may start the assessment by asking three or four questions about your drinking. Your health care provider may perform a physical exam or do lab tests to see if you have physical problems resulting from alcohol use. She or he may refer you to a mental health professional for evaluation. How is this treated? Some people with  alcohol use disorder are able to reduce their alcohol use to low-risk levels. Others need to completely quit drinking alcohol. When necessary, mental health professionals with specialized training in substance use treatment can help. Your health care provider can help you decide how severe your alcohol use disorder is and what type of treatment you need. The following forms of treatment are available:  Detoxification. Detoxification involves quitting drinking and using prescription medicines within the first week to help lessen withdrawal symptoms. This treatment is important for people who have had withdrawal symptoms before and for heavy drinkers who are likely to have withdrawal symptoms. Alcohol withdrawal can be dangerous, and in severe cases, it can cause death. Detoxification may be provided in a home, community, or primary care setting, or in a hospital or substance use treatment facility.  Counseling. This treatment is also called talk therapy. It is provided by substance use treatment counselors. A counselor can address the reasons you use alcohol and suggest ways to keep you from drinking again or to prevent problem drinking. The goals of talk therapy are to: ? Find healthy activities and ways for you to cope with stress. ? Identify and avoid the things that trigger your alcohol use. ? Help you learn how to handle cravings.  Medicines.Medicines can help treat alcohol use disorder by: ? Decreasing alcohol cravings. ? Decreasing the positive feeling you have when you drink alcohol. ? Causing an uncomfortable physical reaction when you drink alcohol (aversion therapy).  Support groups. Support groups are led by people who have quit drinking. They provide emotional support, advice, and guidance.  These forms of treatment are often combined. Some people with this condition benefit from a combination of treatments provided by specialized substance use treatment centers. Follow these  instructions at home:  Take over-the-counter and prescription medicines only as told by your health care provider.  Check with your health care provider before starting any new medicines.  Ask friends and family members not to offer you alcohol.  Avoid situations where alcohol is served, including gatherings where others are drinking alcohol.  Create a plan for what to do when you are tempted to use alcohol.  Find hobbies or activities that you enjoy that do not include alcohol.  Keep all follow-up visits as told by your health care provider. This is important. How is this prevented?  If you drink, limit alcohol intake to no more than 1 drink a day for nonpregnant women and 2 drinks a day for men. One drink equals 12 oz of beer, 5 oz of wine, or 1 oz of hard liquor.  If you have a mental health condition, get treatment and support.  Do not give alcohol to adolescents.  If you are an adolescent: ? Do not drink alcohol. ? Do not be afraid to say no if someone offers you alcohol. Speak up about why you do not want to drink. You can be a positive role model for your friends and set a good example for those around you by not drinking alcohol. ? If your friends drink, spend time with others who do not drink alcohol. Make new friends who do not use alcohol. ? Find healthy ways to manage stress and emotions, such as meditation or deep breathing, exercise, spending time in nature, listening to music, or talking with a trusted friend or family member. Contact a health care provider if:  You are not able to take your medicines as told.  Your symptoms get worse.  You return to drinking alcohol (relapse) and your symptoms get worse. Get help right away if:  You have thoughts about hurting yourself or others. If you ever feel like you may hurt yourself or others, or have thoughts about taking your own life, get help right away. You can go to your nearest emergency department or call:  Your  local emergency services (911 in the [place]  A suicide crisis helpline, such as the National Suicide Prevention Lifeline at (617)787-2947. This is open 24 hours a day.  Summary  Alcohol use disorder is when your drinking disrupts your daily life. When you have this condition, you drink too much alcohol and you cannot control your drinking.  Treatment may include detoxification, counseling, medicine, and support groups.  Ask friends and family members not to offer you alcohol. Avoid situations where alcohol is served.  Get help right away if you have thoughts about hurting yourself or others. This information is not intended to replace advice given to you by your health care provider. Make sure you discuss any questions you have with your health care provider. Document Released: 07/14/2004 Document Revised: 03/03/2016 Document Reviewed: 03/03/2016 Elsevier Interactive Patient Education  [employer].

## 2018-05-10 NOTE — Assessment & Plan Note (Signed)
So important to control BP and weight

## 2018-05-10 NOTE — Assessment & Plan Note (Signed)
Encouraged weight loss; aim for 186 pounds over the next several months

## 2018-05-11 NOTE — Telephone Encounter (Signed)
Left a message for the patient to call back.  

## 2018-05-15 LAB — CBC WITH DIFFERENTIAL/PLATELET
BASOS PCT: 1.1 %
Basophils Absolute: 72 cells/uL (ref 0–200)
Eosinophils Absolute: 202 cells/uL (ref 15–500)
Eosinophils Relative: 3.1 %
HCT: 43.1 % (ref 38.5–50.0)
Hemoglobin: 14.7 g/dL (ref 13.2–17.1)
Lymphs Abs: 2139 cells/uL (ref 850–3900)
MCH: 30.6 pg (ref 27.0–33.0)
MCHC: 34.1 g/dL (ref 32.0–36.0)
MCV: 89.8 fL (ref 80.0–100.0)
MONOS PCT: 9.8 %
MPV: 11.4 fL (ref 7.5–12.5)
NEUTROS PCT: 53.1 %
Neutro Abs: 3452 cells/uL (ref 1500–7800)
Platelets: 256 10*3/uL (ref 140–400)
RBC: 4.8 10*6/uL (ref 4.20–5.80)
RDW: 14.8 % (ref 11.0–15.0)
Total Lymphocyte: 32.9 %
WBC mixed population: 637 cells/uL (ref 200–950)
WBC: 6.5 10*3/uL (ref 3.8–10.8)

## 2018-05-15 LAB — HEPATIC FUNCTION PANEL
AG RATIO: 1.5 (calc) (ref 1.0–2.5)
ALBUMIN MSPROF: 4.6 g/dL (ref 3.6–5.1)
ALT: 46 U/L (ref 9–46)
AST: 34 U/L (ref 10–40)
Alkaline phosphatase (APISO): 92 U/L (ref 40–115)
BILIRUBIN TOTAL: 0.6 mg/dL (ref 0.2–1.2)
Bilirubin, Direct: 0.1 mg/dL (ref 0.0–0.2)
GLOBULIN: 3 g/dL (ref 1.9–3.7)
Indirect Bilirubin: 0.5 mg/dL (calc) (ref 0.2–1.2)
Total Protein: 7.6 g/dL (ref 6.1–8.1)

## 2018-05-15 LAB — ALKALINE PHOSPHATASE ISOENZYMES
Alkaline phosphatase (APISO): 94 U/L (ref 40–115)
Bone Isoenzymes: 27 % — ABNORMAL LOW (ref 28–66)
INTESTINAL ISOENZYMES (ALP ISO): 2 % (ref 1–24)
Liver Isoenzymes: 71 % — ABNORMAL HIGH (ref 25–69)

## 2018-05-16 NOTE — Telephone Encounter (Signed)
Treadmill myoview scheduled for 05/25/18

## 2018-05-25 ENCOUNTER — Ambulatory Visit
Admission: RE | Admit: 2018-05-25 | Discharge: 2018-05-25 | Disposition: A | Discharge status: Home / Self Care | Payer: BLUE CROSS/BLUE SHIELD | Source: Ambulatory Visit | Attending: Cardiovascular Disease | Admitting: Cardiovascular Disease

## 2018-05-25 DIAGNOSIS — R079 Chest pain, unspecified: Secondary | ICD-10-CM | POA: Insufficient documentation

## 2018-05-25 DIAGNOSIS — I259 Chronic ischemic heart disease, unspecified: Secondary | ICD-10-CM | POA: Diagnosis not present

## 2018-05-25 DIAGNOSIS — R9439 Abnormal result of other cardiovascular function study: Secondary | ICD-10-CM | POA: Insufficient documentation

## 2018-05-25 LAB — NM MYOCAR MULTI W/SPECT W/WALL MOTION / EF
CHL CUP NUCLEAR SSS: 12
CHL CUP RESTING HR STRESS: 78 {beats}/min
CSEPEDS: 0 s
CSEPEW: 1 METS
CSEPHR: 57 %
CSEPPHR: 99 {beats}/min
Exercise duration (min): 0 min
LV dias vol: 116 mL (ref 62–150)
LV sys vol: 56 mL
MPHR: 172 {beats}/min
NUC STRESS TID: 1.08
SDS: 6
SRS: 8

## 2018-05-25 MED ORDER — TECHNETIUM TC 99M TETROFOSMIN IV KIT
30.8450 | PACK | Freq: Once | INTRAVENOUS | Status: AC | PRN
  Administered 2018-05-25: 30.845 via INTRAVENOUS

## 2018-05-25 MED ORDER — TECHNETIUM TC 99M TETROFOSMIN IV KIT
10.4400 | PACK | Freq: Once | INTRAVENOUS | Status: AC | PRN
  Administered 2018-05-25: 10.44 via INTRAVENOUS

## 2018-05-25 MED ORDER — REGADENOSON 0.4 MG/5ML IV SOLN
0.4000 mg | Freq: Once | INTRAVENOUS | Status: AC
  Administered 2018-05-25: 0.4 mg via INTRAVENOUS

## 2018-05-28 ENCOUNTER — Telehealth: Payer: Self-pay | Admitting: *Deleted

## 2018-05-28 NOTE — Telephone Encounter (Signed)
Left a message for the patient to call back.  Patient has been scheduled for 1:20 with Dr. Kirke CorinArida on 12/10 to discuss the stress test results.

## 2018-05-28 NOTE — Telephone Encounter (Signed)
-----   Message from [place] OuchMuhammad A Arida, MD sent at 05/28/2018  4:57 PM EST ----- Inform patient that  stress test was abnormal.  Please add him to my schedule tomorrow at 1:20

## 2018-05-28 NOTE — Telephone Encounter (Signed)
Left a message for the patient to call back.  

## 2018-05-29 ENCOUNTER — Encounter: Payer: Self-pay | Admitting: Cardiovascular Disease

## 2018-05-29 ENCOUNTER — Ambulatory Visit (INDEPENDENT_AMBULATORY_CARE_PROVIDER_SITE_OTHER): Payer: BLUE CROSS/BLUE SHIELD | Admitting: Cardiovascular Disease

## 2018-05-29 VITALS — BP 148/100 | HR 60 | Ht 68.0 in | Wt 204.5 lb

## 2018-05-29 DIAGNOSIS — R079 Chest pain, unspecified: Secondary | ICD-10-CM

## 2018-05-29 DIAGNOSIS — I25110 Atherosclerotic heart disease of native coronary artery with unstable angina pectoris: Secondary | ICD-10-CM

## 2018-05-29 DIAGNOSIS — I10 Essential (primary) hypertension: Secondary | ICD-10-CM

## 2018-05-29 DIAGNOSIS — E785 Hyperlipidemia, unspecified: Secondary | ICD-10-CM | POA: Diagnosis not present

## 2018-05-29 DIAGNOSIS — I2510 Atherosclerotic heart disease of native coronary artery without angina pectoris: Secondary | ICD-10-CM | POA: Diagnosis not present

## 2018-05-29 MED ORDER — LISINOPRIL 20 MG PO TABS: 20.0000 mg | ORAL_TABLET | Freq: Every day | ORAL | 3 refills | Status: DC

## 2018-05-29 NOTE — H&P (View-Only) (Signed)
  Cardiology Office Note   Date:  05/29/2018   ID:  Edward Powell, DOB 12/18/1970, MRN [id number]  PCP:  Lada, [name], MD  Cardiologist:   Muhammad Arida, MD   Chief Complaint  Patient presents with  . other    ABN stress test c/o occassional elevated BP. Meds reviewed verbally with pt.      History of Present Illness: Edward Powell is a 47 y.o. male who presents for follow-up visit regarding coronary artery disease status post  non-ST elevation myocardial infarction and CABG in July 2019.  He has known history of severe hyperlipidemia, tobacco use, excessive alcohol use and  hypertension.  He had non-ST elevation myocardial infarction in July in the setting of severely elevated blood pressure. Cardiac catheterization showed severe three-vessel coronary artery disease with normal ejection fraction. Echocardiogram showed an EF of 55 to 60%, grade 1 diastolic dysfunction and no significant valvular abnormalities.  The patient underwent CABG on July 24 with LIMA to LAD, sequential SVG to ramus and distal left circumflex and SVG to right PDA. He quit smoking after his CABG and decreased alcohol intake.  He was able to lose weight after surgery. He had recurrent episodes of chest pain recently described as tightness feeling substernally with no radiation.  He thought it was mostly related to certain types of foods such as [employer] or [employer].  However, the pain was exertional.  He felt better after he started eating healthy again.  He underwent a recent Lexiscan Myoview which was high risk with evidence of large inferior lateral and lateral ischemia and more than 1 mm of ST depression at low level exercise.  The patient started on the treadmill initially but then was switched to Lexiscan due to inability to achieve maximal predicted heart rate as he took carvedilol that morning.  Ejection fraction was 23%.  Past Medical History:  Diagnosis Date  . Alcohol abuse   . CAD (coronary  artery disease)    a. 12/2017 NSTEMI/Cath: LM 50d, LAD 60ost, 80m, RI nl, LCX 100p, OM2 fills via collats from LAD, RCA 70p, 40m, 90m, 60d, EF 55-65%; b. 12/2017 CABG x 4: LIMA->LAD, VG->RI->LCX, VG->RPDA.  . Diastolic dysfunction    a. 12/2017 Echo: EF 55-60%, no rwma, Gr1 DD, triv MR, nl RV fxn.  . Essential hypertension   . Hyperlipidemia LDL goal <70   . Hypertriglyceridemia   . Tobacco abuse     Past Surgical History:  Procedure Laterality Date  . CORONARY ARTERY BYPASS GRAFT N/A 01/10/2018   Procedure: CORONARY ARTERY BYPASS GRAFTING (CABG);  Surgeon: Gerhardt, Edward B, MD;  Location: [facility] OR;  Service: Open Heart Surgery;  Laterality: N/A;  Times 4 using left internal mammary artery to the LAD and endoscopically harvested left saphenous vein to OM1, OM2, and PLB  . LEFT HEART CATH AND CORONARY ANGIOGRAPHY N/A 01/09/2018   Procedure: LEFT HEART CATH AND CORONARY ANGIOGRAPHY;  Surgeon: End, Christopher, MD;  Location: [facility] INVASIVE CV LAB;  Service: Cardiovascular;  Laterality: N/A;  . NO PAST SURGERIES    . TEE WITHOUT CARDIOVERSION N/A 01/10/2018   Procedure: TRANSESOPHAGEAL ECHOCARDIOGRAM (TEE);  Surgeon: Gerhardt, Edward B, MD;  Location: [facility] OR;  Service: Open Heart Surgery;  Laterality: N/A;     Current Outpatient Medications  Medication Sig Dispense Refill  . acetaminophen (TYLENOL) 325 MG tablet Take 2 tablets (650 mg total) by mouth every 6 (six) hours as needed for mild pain (or Fever >/= 101).    .   aspirin EC 81 MG EC tablet Take 1 tablet (81 mg total) by mouth daily.    . atorvastatin (LIPITOR) 80 MG tablet Take 1 tablet (80 mg total) by mouth daily at 6 PM. 90 tablet 3  . carvedilol (COREG) 25 MG tablet Take 1 tablet (25 mg total) by mouth 2 (two) times daily with a meal. 180 tablet 3  . clopidogrel (PLAVIX) 75 MG tablet Take 1 tablet (75 mg total) by mouth daily. 90 tablet 3  . ipratropium-albuterol (DUONEB) 0.5-2.5 (3) MG/3ML SOLN Take 3 mLs by nebulization every 6 (six)  hours. (Patient taking differently: Take 3 mLs by nebulization every 6 (six) hours as needed. ) 360 mL 0  . lisinopril (PRINIVIL,ZESTRIL) 10 MG tablet Take 1 tablet (10 mg total) by mouth daily. 30 tablet 3   No current facility-administered medications for this visit.     Allergies:   Patient has no known allergies.    Social History:  The patient  reports that he has quit smoking. He has a 30.00 pack-year smoking history. He has never used smokeless tobacco. He reports that he drank about 9.0 standard drinks of alcohol per week. He reports that he has current or past drug history.   Family History:  The patient's family history includes Alzheimer's disease in his paternal grandmother; Atrial fibrillation in his father; Breast cancer in his mother; Heart attack in his maternal grandmother; Hyperlipidemia in his brother, maternal grandmother, and mother; Hypertension in his brother, father, maternal grandmother, mother, and paternal grandmother; Liver cancer in his father; Lung cancer in his father; Stroke in his brother, maternal grandmother, and paternal grandmother.    ROS:  Please see the history of present illness.   Otherwise, review of systems are positive for none.   All other systems are reviewed and negative.    PHYSICAL EXAM: VS:  BP (!) 148/100 (BP Location: Left Arm, Patient Position: Sitting, Cuff Size: Normal)   Pulse 60   Ht 5' 8" (1.727 m)   Wt 204 lb 8 oz (92.8 kg)   BMI 31.09 kg/m  , BMI Body mass index is 31.09 kg/m. GEN: Well nourished, well developed, in no acute distress  HEENT: normal  Neck: no JVD, carotid bruits, or masses Cardiac: RRR; no murmurs, rubs, or gallops,no edema  Respiratory:  clear to auscultation bilaterally, normal work of breathing GI: soft, nontender, nondistended, + BS MS: no deformity or atrophy  Skin: warm and dry, no rash Neuro:  Strength and sensation are intact Psych: euthymic mood, full affect Left radial pulses normal.  EKG:  EKG  is ordered today. The ekg ordered today demonstrates normal sinus rhythm with no significant ST or T wave changes.   Recent Labs: 01/15/2018: BUN 13; Creatinine, Ser 0.88; Magnesium 2.1; Potassium 3.8; Sodium 137 05/10/2018: ALT 46; Hemoglobin 14.7; Platelets 256    Lipid Panel    Component Value Date/Time   CHOL 135 03/06/2018 0845   TRIG 245 (H) 03/06/2018 0845   HDL 32 (L) 03/06/2018 0845   CHOLHDL 4.2 03/06/2018 0845   CHOLHDL 5.5 01/09/2018 0510   VLDL 53 (H) 01/09/2018 0510   LDLCALC 54 03/06/2018 0845      Wt Readings from Last 3 Encounters:  05/29/18 204 lb 8 oz (92.8 kg)  05/10/18 200 lb 6.4 oz (90.9 kg)  03/08/18 198 lb 4 oz (89.9 kg)        No flowsheet data found.    ASSESSMENT AND PLAN:  1.  Coronary artery disease involving   native coronary arteries with unstable angina and recent high risk abnormal nuclear stress test.   Due to his symptoms and stress test findings, I recommend proceeding with urgent left heart catheterization and possible PCI.  I discussed the procedure in details as well as risks and benefits. Planned access is via the left radial artery.  2.  Hyperlipidemia: His lipid profile improved significantly with high-dose atorvastatin with a drop in LDL to 55.  He continues to have elevated triglyceride and he might benefit from Vascepa.   3.  Essential hypertension: Blood pressure has been elevated lately.  I increase lisinopril to 20 mg daily.  4.  Possible ischemic cardiomyopathy: EF was 23% by nuclear stress test.  I will plan on doing left ventricular angiography with cardiac cath.  His ejection fraction was previously normal.    Disposition:   FU with me in 1 month  Signed,  Muhammad Arida, MD  05/29/2018 1:39 PM     [facility] 

## 2018-05-29 NOTE — Patient Instructions (Addendum)
Medication Instructions:  Your physician has recommended you make the following change in your medication:  1- Increase lisinopril to 1 tablets (20 mg daily).   If you need a refill on your cardiac medications before your next appointment, please call your pharmacy.   Lab work: Your physician recommends that you return for lab work in: today.    If you have labs (blood work) drawn today and your tests are completely normal, you will receive your results only by: Marland Kitchen MyChart Message (if you have MyChart) OR . A paper copy in the mail If you have any lab test that is abnormal or we need to change your treatment, we will call you to review the results.  Testing/Procedures: 1- Your physician has requested that you have a cardiac catheterization. Cardiac catheterization is used to diagnose and/or treat various heart conditions. Doctors may recommend this procedure for a number of different reasons. The most common reason is to evaluate chest pain. Chest pain can be a symptom of coronary artery disease (CAD), and cardiac catheterization can show whether plaque is narrowing or blocking your heart's arteries. This procedure is also used to evaluate the valves, as well as measure the blood flow and oxygen levels in different parts of your heart.    [facility] CARDIOVASCULAR DIVISION [facility] [address] [place] [address] Dept: [phone] Loc: [phone]  Edward Powell  05/29/2018  You are scheduled for a Cardiac Catheterization on Thursday, December 12 with Dr. Lorine Bears.  1. Please arrive at ____  At [facility].  (This time is one hour before your procedure to ensure your preparation). Free valet parking service is available.   Special note: Every effort is made to have your procedure done on time. Please understand that emergencies sometimes delay scheduled procedures.  2. Diet: Do not eat solid foods after  midnight.  The patient may have clear liquids until 5am upon the day of the procedure.  3. Labs: You will need to have blood drawn today   4. Medication instructions in preparation for your procedure:  You may take all your prescribed medications with a small sip of water.    On the morning of your procedure, take your Aspirin and any morning medicines NOT listed above.  You may use sips of water.  5. Plan for one night stay--bring personal belongings. 6. Bring a current list of your medications and current insurance cards. 7. You MUST have a responsible person to drive you home. 8. Someone MUST be with you the first 24 hours after you arrive home or your discharge will be delayed. 9. Please wear clothes that are easy to get on and off and wear slip-on shoes.  Thank you for allowing [place] to care for you!   -- [facility]  Follow-Up: At [facility], you and your health needs are our priority.  As part of our continuing mission to provide you with exceptional heart care, we have created designated Provider Care Teams.  These Care Teams include your primary Cardiologist (physician) and Advanced Practice Providers (APPs -  Physician Assistants and Nurse Practitioners) who all work together to provide you with the care you need, when you need it. You will need a follow up appointment in 1 months.  You may see Lorine Bears, MD or one of the following Advanced Practice Providers on your designated Care Team:   Nicolasa Ducking, NP Eula Listen, PA-C .  Marisue Ivan, PA-C   Coronary Angiogram A coronary angiogram is an X-ray procedure that is used to examine the arteries in the heart. In this procedure, a dye (contrast dye) is injected through a long, thin tube (catheter). The catheter is inserted through the groin, wrist, or arm. The dye is injected into each artery, then X-rays are taken to show if there is a blockage in the arteries of the heart. This  procedure can also show if you have valve disease or a disease of the aorta, and it can be used to check the overall function of your heart muscle. You may have a coronary angiogram if:  You are having chest pain, or other symptoms of angina, and you are at risk for heart disease.  You have an abnormal electrocardiogram (ECG) or stress test.  You have chest pain and heart failure.  You are having irregular heart rhythms.  You and your health care provider determine that the benefits of the test information outweigh the risks of the procedure.  Let your health care provider know about:  Any allergies you have, including allergies to contrast dye.  All medicines you are taking, including vitamins, herbs, eye drops, creams, and over-the-counter medicines.  Any problems you or family members have had with anesthetic medicines.  Any blood disorders you have.  Any surgeries you have had.  History of kidney problems or kidney failure.  Any medical conditions you have.  Whether you are pregnant or may be pregnant. What are the risks? Generally, this is a safe procedure. However, problems may occur, including:  Infection.  Allergic reaction to medicines or dyes that are used.  Bleeding from the access site or other locations.  Kidney injury, especially in people with impaired kidney function.  Stroke (rare).  Heart attack (rare).  Damage to other structures or organs.  What happens before the procedure? Staying hydrated Follow instructions from your health care provider about hydration, which may include:  Up to 2 hours before the procedure - you may continue to drink clear liquids, such as water, clear fruit juice, black coffee, and plain tea.  Eating and drinking restrictions Follow instructions from your health care provider about eating and drinking, which may include:  8 hours before the procedure - stop eating heavy meals or foods such as meat, fried foods, or  fatty foods.  6 hours before the procedure - stop eating light meals or foods, such as toast or cereal.  2 hours before the procedure - stop drinking clear liquids.  General instructions  Ask your health care provider about: ? Changing or stopping your regular medicines. This is especially important if you are taking diabetes medicines or blood thinners. ? Taking medicines such as ibuprofen. These medicines can thin your blood. Do not take these medicines before your procedure if your health care provider instructs you not to, though aspirin may be recommended prior to coronary angiograms.  Plan to have someone take you home from the hospital or clinic.  You may need to have blood tests or X-rays done. What happens during the procedure?  An IV tube will be inserted into one of your veins.  You will be given one or more of the following: ? A medicine to help you relax (sedative). ? A medicine to numb the area where the catheter will be inserted into an artery (local anesthetic).  To reduce your risk of infection: ? Your health care team will wash or sanitize their hands. ? Your skin  will be washed with soap. ? Hair may be removed from the area where the catheter will be inserted.  You will be connected to a continuous ECG monitor.  The catheter will be inserted into an artery. The location may be in your groin, in your wrist, or in the fold of your arm (near your elbow).  A type of X-ray (fluoroscopy) will be used to help guide the catheter to the opening of the blood vessel that is being examined.  A dye will be injected into the catheter, and X-rays will be taken. The dye will help to show where any narrowing or blockages are located in the heart arteries.  Tell your health care provider if you have any chest pain or trouble breathing during the procedure.  If blockages are found, your health care provider may perform another procedure, such as inserting a coronary stent. The  procedure may vary among health care providers and hospitals. What happens after the procedure?  After the procedure, you will need to keep the area still for a few hours, or for as long as told by your health care provider. If the procedure is done through the groin, you will be instructed to not bend and not Brummet your legs.  The insertion site will be checked frequently.  The pulse in your foot or wrist will be checked frequently.  You may have additional blood tests, X-rays, and a test that records the electrical activity of your heart (ECG).  Do not drive for 24 hours if you were given a sedative. Summary  A coronary angiogram is an X-ray procedure that is used to look into the arteries in the heart.  During the procedure, a dye (contrast dye) is injected through a long, thin tube (catheter). The catheter is inserted through the groin, wrist, or arm.  Tell your health care provider about any allergies you have, including allergies to contrast dye.  After the procedure, you will need to keep the area still for a few hours, or for as long as told by your health care provider. This information is not intended to replace advice given to you by your health care provider. Make sure you discuss any questions you have with your health care provider. Document Released: 12/11/2002 Document Revised: 03/18/2016 Document Reviewed: 03/18/2016 Elsevier Interactive Patient Education  [employer]2018 [employer]

## 2018-05-29 NOTE — H&P (View-Only) (Signed)
  Cardiology Office Note   Date:  05/29/2018   ID:  Edward Powell, DOB 10/21/1970, MRN [id number]  PCP:  Lada, [name], MD  Cardiologist:   Refugio Mcconico, MD   Chief Complaint  Patient presents with  . other    ABN stress test c/o occassional elevated BP. Meds reviewed verbally with pt.      History of Present Illness: Edward Powell is a 47 y.o. male who presents for follow-up visit regarding coronary artery disease status post  non-ST elevation myocardial infarction and CABG in July 2019.  He has known history of severe hyperlipidemia, tobacco use, excessive alcohol use and  hypertension.  He had non-ST elevation myocardial infarction in July in the setting of severely elevated blood pressure. Cardiac catheterization showed severe three-vessel coronary artery disease with normal ejection fraction. Echocardiogram showed an EF of 55 to 60%, grade 1 diastolic dysfunction and no significant valvular abnormalities.  The patient underwent CABG on July 24 with LIMA to LAD, sequential SVG to ramus and distal left circumflex and SVG to right PDA. He quit smoking after his CABG and decreased alcohol intake.  He was able to lose weight after surgery. He had recurrent episodes of chest pain recently described as tightness feeling substernally with no radiation.  He thought it was mostly related to certain types of foods such as [employer] or [employer].  However, the pain was exertional.  He felt better after he started eating healthy again.  He underwent a recent Lexiscan Myoview which was high risk with evidence of large inferior lateral and lateral ischemia and more than 1 mm of ST depression at low level exercise.  The patient started on the treadmill initially but then was switched to Lexiscan due to inability to achieve maximal predicted heart rate as he took carvedilol that morning.  Ejection fraction was 23%.  Past Medical History:  Diagnosis Date  . Alcohol abuse   . CAD (coronary  artery disease)    a. 12/2017 NSTEMI/Cath: LM 50d, LAD 60ost, 80m, RI nl, LCX 100p, OM2 fills via collats from LAD, RCA 70p, 40m, 90m, 60d, EF 55-65%; b. 12/2017 CABG x 4: LIMA->LAD, VG->RI->LCX, VG->RPDA.  . Diastolic dysfunction    a. 12/2017 Echo: EF 55-60%, no rwma, Gr1 DD, triv MR, nl RV fxn.  . Essential hypertension   . Hyperlipidemia LDL goal <70   . Hypertriglyceridemia   . Tobacco abuse     Past Surgical History:  Procedure Laterality Date  . CORONARY ARTERY BYPASS GRAFT N/A 01/10/2018   Procedure: CORONARY ARTERY BYPASS GRAFTING (CABG);  Surgeon: Gerhardt, Edward B, MD;  Location: [facility] OR;  Service: Open Heart Surgery;  Laterality: N/A;  Times 4 using left internal mammary artery to the LAD and endoscopically harvested left saphenous vein to OM1, OM2, and PLB  . LEFT HEART CATH AND CORONARY ANGIOGRAPHY N/A 01/09/2018   Procedure: LEFT HEART CATH AND CORONARY ANGIOGRAPHY;  Surgeon: End, Christopher, MD;  Location: [facility] INVASIVE CV LAB;  Service: Cardiovascular;  Laterality: N/A;  . NO PAST SURGERIES    . TEE WITHOUT CARDIOVERSION N/A 01/10/2018   Procedure: TRANSESOPHAGEAL ECHOCARDIOGRAM (TEE);  Surgeon: Gerhardt, Edward B, MD;  Location: [facility] OR;  Service: Open Heart Surgery;  Laterality: N/A;     Current Outpatient Medications  Medication Sig Dispense Refill  . acetaminophen (TYLENOL) 325 MG tablet Take 2 tablets (650 mg total) by mouth every 6 (six) hours as needed for mild pain (or Fever >/= 101).    .   aspirin EC 81 MG EC tablet Take 1 tablet (81 mg total) by mouth daily.    . atorvastatin (LIPITOR) 80 MG tablet Take 1 tablet (80 mg total) by mouth daily at 6 PM. 90 tablet 3  . carvedilol (COREG) 25 MG tablet Take 1 tablet (25 mg total) by mouth 2 (two) times daily with a meal. 180 tablet 3  . clopidogrel (PLAVIX) 75 MG tablet Take 1 tablet (75 mg total) by mouth daily. 90 tablet 3  . ipratropium-albuterol (DUONEB) 0.5-2.5 (3) MG/3ML SOLN Take 3 mLs by nebulization every 6 (six)  hours. (Patient taking differently: Take 3 mLs by nebulization every 6 (six) hours as needed. ) 360 mL 0  . lisinopril (PRINIVIL,ZESTRIL) 10 MG tablet Take 1 tablet (10 mg total) by mouth daily. 30 tablet 3   No current facility-administered medications for this visit.     Allergies:   Patient has no known allergies.    Social History:  The patient  reports that he has quit smoking. He has a 30.00 pack-year smoking history. He has never used smokeless tobacco. He reports that he drank about 9.0 standard drinks of alcohol per week. He reports that he has current or past drug history.   Family History:  The patient's family history includes Alzheimer's disease in his paternal grandmother; Atrial fibrillation in his father; Breast cancer in his mother; Heart attack in his maternal grandmother; Hyperlipidemia in his brother, maternal grandmother, and mother; Hypertension in his brother, father, maternal grandmother, mother, and paternal grandmother; Liver cancer in his father; Lung cancer in his father; Stroke in his brother, maternal grandmother, and paternal grandmother.    ROS:  Please see the history of present illness.   Otherwise, review of systems are positive for none.   All other systems are reviewed and negative.    PHYSICAL EXAM: VS:  BP (!) 148/100 (BP Location: Left Arm, Patient Position: Sitting, Cuff Size: Normal)   Pulse 60   Ht 5' 8" (1.727 m)   Wt 204 lb 8 oz (92.8 kg)   BMI 31.09 kg/m  , BMI Body mass index is 31.09 kg/m. GEN: Well nourished, well developed, in no acute distress  HEENT: normal  Neck: no JVD, carotid bruits, or masses Cardiac: RRR; no murmurs, rubs, or gallops,no edema  Respiratory:  clear to auscultation bilaterally, normal work of breathing GI: soft, nontender, nondistended, + BS MS: no deformity or atrophy  Skin: warm and dry, no rash Neuro:  Strength and sensation are intact Psych: euthymic mood, full affect Left radial pulses normal.  EKG:  EKG  is ordered today. The ekg ordered today demonstrates normal sinus rhythm with no significant ST or T wave changes.   Recent Labs: 01/15/2018: BUN 13; Creatinine, Ser 0.88; Magnesium 2.1; Potassium 3.8; Sodium 137 05/10/2018: ALT 46; Hemoglobin 14.7; Platelets 256    Lipid Panel    Component Value Date/Time   CHOL 135 03/06/2018 0845   TRIG 245 (H) 03/06/2018 0845   HDL 32 (L) 03/06/2018 0845   CHOLHDL 4.2 03/06/2018 0845   CHOLHDL 5.5 01/09/2018 0510   VLDL 53 (H) 01/09/2018 0510   LDLCALC 54 03/06/2018 0845      Wt Readings from Last 3 Encounters:  05/29/18 204 lb 8 oz (92.8 kg)  05/10/18 200 lb 6.4 oz (90.9 kg)  03/08/18 198 lb 4 oz (89.9 kg)        No flowsheet data found.    ASSESSMENT AND PLAN:  1.  Coronary artery disease involving   native coronary arteries with unstable angina and recent high risk abnormal nuclear stress test.   Due to his symptoms and stress test findings, I recommend proceeding with urgent left heart catheterization and possible PCI.  I discussed the procedure in details as well as risks and benefits. Planned access is via the left radial artery.  2.  Hyperlipidemia: His lipid profile improved significantly with high-dose atorvastatin with a drop in LDL to 55.  He continues to have elevated triglyceride and he might benefit from Vascepa.   3.  Essential hypertension: Blood pressure has been elevated lately.  I increase lisinopril to 20 mg daily.  4.  Possible ischemic cardiomyopathy: EF was 23% by nuclear stress test.  I will plan on doing left ventricular angiography with cardiac cath.  His ejection fraction was previously normal.    Disposition:   FU with me in 1 month  Signed,  Pilar Corrales, MD  05/29/2018 1:39 PM    [facility] 

## 2018-05-29 NOTE — Progress Notes (Signed)
Cardiology Office Note   Date:  05/29/2018   ID:  Edward SpeckingDarin W Powell, DOB 1970/10/22, MRN [id number]  PCP:  [name], [name], MD  Cardiologist:   Lorine BearsMuhammad Arida, MD   Chief Complaint  Patient presents with  . other    ABN stress test c/o occassional elevated BP. Meds reviewed verbally with pt.      History of Present Illness: Edward SpeckingDarin W Powell is a 47 y.o. male who presents for follow-up visit regarding coronary artery disease status post  non-ST elevation myocardial infarction and CABG in July 2019.  He has known history of severe hyperlipidemia, tobacco use, excessive alcohol use and  hypertension.  He had non-ST elevation myocardial infarction in July in the setting of severely elevated blood pressure. Cardiac catheterization showed severe three-vessel coronary artery disease with normal ejection fraction. Echocardiogram showed an EF of 55 to 60%, grade 1 diastolic dysfunction and no significant valvular abnormalities.  The patient underwent CABG on July 24 with LIMA to LAD, sequential SVG to ramus and distal left circumflex and SVG to right PDA. He quit smoking after his CABG and decreased alcohol intake.  He was able to lose weight after surgery. He had recurrent episodes of chest pain recently described as tightness feeling substernally with no radiation.  He thought it was mostly related to certain types of foods such as [employer] or [employer].  However, the pain was exertional.  He felt better after he started eating healthy again.  He underwent a recent [employer] Myoview which was high risk with evidence of large inferior lateral and lateral ischemia and more than 1 mm of ST depression at low level exercise.  The patient started on the treadmill initially but then was switched to Lexiscan due to inability to achieve maximal predicted heart rate as he took carvedilol that morning.  Ejection fraction was 23%.  Past Medical History:  Diagnosis Date  . Alcohol abuse   . CAD (coronary  artery disease)    a. 12/2017 NSTEMI/Cath: LM 50d, LAD 60ost, 1854m, RI nl, LCX 100p, OM2 fills via collats from LAD, RCA 70p, 416m, 8648m, 60d, EF 55-65%; b. 12/2017 CABG x 4: LIMA->LAD, VG->RI->LCX, VG->RPDA.  . Diastolic dysfunction    a. 12/2017 Echo: EF 55-60%, no rwma, Gr1 DD, triv MR, nl RV fxn.  . Essential hypertension   . Hyperlipidemia LDL goal <70   . Hypertriglyceridemia   . Tobacco abuse     Past Surgical History:  Procedure Laterality Date  . CORONARY ARTERY BYPASS GRAFT N/A 01/10/2018   Procedure: CORONARY ARTERY BYPASS GRAFTING (CABG);  Surgeon: Delight OvensGerhardt, Edward B, MD;  [facility];  Service: Open Heart Surgery;  Laterality: N/A;  Times 4 using left internal mammary artery to the LAD and endoscopically harvested left saphenous vein to OM1, OM2, and PLB  . LEFT HEART CATH AND CORONARY ANGIOGRAPHY N/A 01/09/2018   Procedure: LEFT HEART CATH AND CORONARY ANGIOGRAPHY;  Surgeon: Yvonne KendallEnd, Christopher, MD;  Location: [facility] INVASIVE CV LAB;  Service: Cardiovascular;  Laterality: N/A;  . NO PAST SURGERIES    . TEE WITHOUT CARDIOVERSION N/A 01/10/2018   Procedure: TRANSESOPHAGEAL ECHOCARDIOGRAM (TEE);  Surgeon: Delight OvensGerhardt, Edward B, MD;  [facility];  Service: Open Heart Surgery;  Laterality: N/A;     Current Outpatient Medications  Medication Sig Dispense Refill  . acetaminophen (TYLENOL) 325 MG tablet Take 2 tablets (650 mg total) by mouth every 6 (six) hours as needed for mild pain (or Fever >/= 101).    .Marland Kitchen  aspirin EC 81 MG EC tablet Take 1 tablet (81 mg total) by mouth daily.    Marland Kitchen atorvastatin (LIPITOR) 80 MG tablet Take 1 tablet (80 mg total) by mouth daily at 6 PM. 90 tablet 3  . carvedilol (COREG) 25 MG tablet Take 1 tablet (25 mg total) by mouth 2 (two) times daily with a meal. 180 tablet 3  . clopidogrel (PLAVIX) 75 MG tablet Take 1 tablet (75 mg total) by mouth daily. 90 tablet 3  . ipratropium-albuterol (DUONEB) 0.5-2.5 (3) MG/3ML SOLN Take 3 mLs by nebulization every 6 (six)  hours. (Patient taking differently: Take 3 mLs by nebulization every 6 (six) hours as needed. ) 360 mL 0  . lisinopril (PRINIVIL,ZESTRIL) 10 MG tablet Take 1 tablet (10 mg total) by mouth daily. 30 tablet 3   No current facility-administered medications for this visit.     Allergies:   Patient has no known allergies.    Social History:  The patient  reports that he has quit smoking. He has a 30.00 pack-year smoking history. He has never used smokeless tobacco. He reports that he drank about 9.0 standard drinks of alcohol per week. He reports that he has current or past drug history.   Family History:  The patient's family history includes Alzheimer's disease in his paternal grandmother; Atrial fibrillation in his father; Breast cancer in his mother; Heart attack in his maternal grandmother; Hyperlipidemia in his brother, maternal grandmother, and mother; Hypertension in his brother, father, maternal grandmother, mother, and paternal grandmother; Liver cancer in his father; Lung cancer in his father; Stroke in his brother, maternal grandmother, and paternal grandmother.    ROS:  Please see the history of present illness.   Otherwise, review of systems are positive for none.   All other systems are reviewed and negative.    PHYSICAL EXAM: VS:  BP (!) 148/100 (BP Location: Left Arm, Patient Position: Sitting, Cuff Size: Normal)   Pulse 60   Ht 5\' 8"  (1.727 m)   Wt 204 lb 8 oz (92.8 kg)   BMI 31.09 kg/m  , BMI Body mass index is 31.09 kg/m. GEN: Well nourished, well developed, in no acute distress  HEENT: normal  Neck: no JVD, carotid bruits, or masses Cardiac: RRR; no murmurs, rubs, or gallops,no edema  Respiratory:  clear to auscultation bilaterally, normal work of breathing GI: soft, nontender, nondistended, + BS MS: no deformity or atrophy  Skin: warm and dry, no rash Neuro:  Strength and sensation are intact Psych: euthymic mood, full affect Left radial pulses normal.  EKG:  EKG  is ordered today. The ekg ordered today demonstrates normal sinus rhythm with no significant ST or T wave changes.   Recent Labs: 01/15/2018: BUN 13; Creatinine, Ser 0.88; Magnesium 2.1; Potassium 3.8; Sodium 137 05/10/2018: ALT 46; Hemoglobin 14.7; Platelets 256    Lipid Panel    Component Value Date/Time   CHOL 135 03/06/2018 0845   TRIG 245 (H) 03/06/2018 0845   HDL 32 (L) 03/06/2018 0845   CHOLHDL 4.2 03/06/2018 0845   CHOLHDL 5.5 01/09/2018 0510   VLDL 53 (H) 01/09/2018 0510   LDLCALC 54 03/06/2018 0845      Wt Readings from Last 3 Encounters:  05/29/18 204 lb 8 oz (92.8 kg)  05/10/18 200 lb 6.4 oz (90.9 kg)  03/08/18 198 lb 4 oz (89.9 kg)        No flowsheet data found.    ASSESSMENT AND PLAN:  1.  Coronary artery disease involving  native coronary arteries with unstable angina and recent high risk abnormal nuclear stress test.   Due to his symptoms and stress test findings, I recommend proceeding with urgent left heart catheterization and possible PCI.  I discussed the procedure in details as well as risks and benefits. Planned access is via the left radial artery.  2.  Hyperlipidemia: His lipid profile improved significantly with high-dose atorvastatin with a drop in LDL to 55.  He continues to have elevated triglyceride and he might benefit from Vascepa.   3.  Essential hypertension: Blood pressure has been elevated lately.  I increase lisinopril to 20 mg daily.  4.  Possible ischemic cardiomyopathy: EF was 23% by nuclear stress test.  I will plan on doing left ventricular angiography with cardiac cath.  His ejection fraction was previously normal.    Disposition:   FU with me in 1 month  Signed,  Lorine Bears, MD  05/29/2018 1:39 PM    [facility]

## 2018-05-30 LAB — CBC
Hematocrit: 40.7 % (ref 37.5–51.0)
Hemoglobin: 14.2 g/dL (ref 13.0–17.7)
MCH: 30.9 pg (ref 26.6–33.0)
MCHC: 34.9 g/dL (ref 31.5–35.7)
MCV: 89 fL (ref 79–97)
PLATELETS: 220 10*3/uL (ref 150–450)
RBC: 4.59 x10E6/uL (ref 4.14–5.80)
RDW: 14.7 % (ref 12.3–15.4)
WBC: 6.5 10*3/uL (ref 3.4–10.8)

## 2018-05-30 LAB — BASIC METABOLIC PANEL
BUN / CREAT RATIO: 13 (ref 9–20)
BUN: 10 mg/dL (ref 6–24)
CO2: 22 mmol/L (ref 20–29)
CREATININE: 0.79 mg/dL (ref 0.76–1.27)
Calcium: 10.1 mg/dL (ref 8.7–10.2)
Chloride: 100 mmol/L (ref 96–106)
GFR, EST AFRICAN AMERICAN: 123 mL/min/{1.73_m2} (ref >59)
GFR, EST NON AFRICAN AMERICAN: 106 mL/min/{1.73_m2} (ref >59)
Glucose: 91 mg/dL (ref 65–99)
Potassium: 4.7 mmol/L (ref 3.5–5.2)
Sodium: 142 mmol/L (ref 134–144)

## 2018-05-31 ENCOUNTER — Encounter
Admission: RE | Disposition: A | Discharge status: Home / Self Care | Payer: Self-pay | Source: Home / Self Care | Attending: Cardiovascular Disease

## 2018-05-31 ENCOUNTER — Other Ambulatory Visit: Payer: Self-pay

## 2018-05-31 ENCOUNTER — Ambulatory Visit
Admission: RE | Admit: 2018-05-31 | Discharge: 2018-05-31 | Disposition: A | Discharge status: Home / Self Care | Payer: BLUE CROSS/BLUE SHIELD | Attending: Cardiovascular Disease | Admitting: Cardiovascular Disease

## 2018-05-31 DIAGNOSIS — I10 Essential (primary) hypertension: Secondary | ICD-10-CM | POA: Insufficient documentation

## 2018-05-31 DIAGNOSIS — Z7902 Long term (current) use of antithrombotics/antiplatelets: Secondary | ICD-10-CM | POA: Diagnosis not present

## 2018-05-31 DIAGNOSIS — E785 Hyperlipidemia, unspecified: Secondary | ICD-10-CM | POA: Diagnosis not present

## 2018-05-31 DIAGNOSIS — R079 Chest pain, unspecified: Secondary | ICD-10-CM | POA: Diagnosis present

## 2018-05-31 DIAGNOSIS — Z8249 Family history of ischemic heart disease and other diseases of the circulatory system: Secondary | ICD-10-CM | POA: Diagnosis not present

## 2018-05-31 DIAGNOSIS — Z7982 Long term (current) use of aspirin: Secondary | ICD-10-CM | POA: Insufficient documentation

## 2018-05-31 DIAGNOSIS — E781 Pure hyperglyceridemia: Secondary | ICD-10-CM | POA: Insufficient documentation

## 2018-05-31 DIAGNOSIS — I25710 Atherosclerosis of autologous vein coronary artery bypass graft(s) with unstable angina pectoris: Secondary | ICD-10-CM | POA: Insufficient documentation

## 2018-05-31 DIAGNOSIS — Z87891 Personal history of nicotine dependence: Secondary | ICD-10-CM | POA: Insufficient documentation

## 2018-05-31 DIAGNOSIS — I252 Old myocardial infarction: Secondary | ICD-10-CM | POA: Diagnosis not present

## 2018-05-31 DIAGNOSIS — I25110 Atherosclerotic heart disease of native coronary artery with unstable angina pectoris: Secondary | ICD-10-CM

## 2018-05-31 HISTORY — PX: LEFT HEART CATH AND CORONARY ANGIOGRAPHY: CATH118249

## 2018-05-31 SURGERY — LEFT HEART CATH AND CORONARY ANGIOGRAPHY
Anesthesia: Moderate Sedation

## 2018-05-31 MED ORDER — SODIUM CHLORIDE 0.9 % IV SOLN: 250.0000 mL | INTRAVENOUS | Status: DC | PRN

## 2018-05-31 MED ORDER — VERAPAMIL HCL 2.5 MG/ML IV SOLN
INTRAVENOUS | Status: AC
  Filled 2018-05-31: qty 2

## 2018-05-31 MED ORDER — SODIUM CHLORIDE 0.9% FLUSH: 3.0000 mL | INTRAVENOUS | Status: DC | PRN

## 2018-05-31 MED ORDER — FENTANYL CITRATE (PF) 100 MCG/2ML IJ SOLN
INTRAMUSCULAR | Status: DC | PRN
  Administered 2018-05-31: 50 ug via INTRAVENOUS

## 2018-05-31 MED ORDER — HEPARIN SODIUM (PORCINE) 1000 UNIT/ML IJ SOLN
INTRAMUSCULAR | Status: DC | PRN
  Administered 2018-05-31: 5000 [IU] via INTRAVENOUS

## 2018-05-31 MED ORDER — HEPARIN (PORCINE) IN NACL 1000-0.9 UT/500ML-% IV SOLN
INTRAVENOUS | Status: AC
  Filled 2018-05-31: qty 1000

## 2018-05-31 MED ORDER — ONDANSETRON HCL 4 MG/2ML IJ SOLN: 4.0000 mg | Freq: Four times a day (QID) | INTRAMUSCULAR | Status: DC | PRN

## 2018-05-31 MED ORDER — HYDRALAZINE HCL 20 MG/ML IJ SOLN
INTRAMUSCULAR | Status: AC
  Administered 2018-05-31: 10 mg via INTRAVENOUS
  Filled 2018-05-31: qty 1

## 2018-05-31 MED ORDER — SODIUM CHLORIDE 0.9 % IV SOLN: INTRAVENOUS | Status: DC

## 2018-05-31 MED ORDER — SODIUM CHLORIDE 0.9% FLUSH: 3.0000 mL | Freq: Two times a day (BID) | INTRAVENOUS | Status: DC

## 2018-05-31 MED ORDER — HYDRALAZINE HCL 20 MG/ML IJ SOLN
10.0000 mg | Freq: Once | INTRAMUSCULAR | Status: AC
  Administered 2018-05-31: 10 mg via INTRAVENOUS

## 2018-05-31 MED ORDER — MIDAZOLAM HCL 2 MG/2ML IJ SOLN
INTRAMUSCULAR | Status: DC | PRN
  Administered 2018-05-31: 1 mg via INTRAVENOUS

## 2018-05-31 MED ORDER — MIDAZOLAM HCL 2 MG/2ML IJ SOLN
INTRAMUSCULAR | Status: AC
  Filled 2018-05-31: qty 2

## 2018-05-31 MED ORDER — SODIUM CHLORIDE 0.9 % IV SOLN
INTRAVENOUS | Status: DC
  Administered 2018-05-31: 09:00:00 via INTRAVENOUS

## 2018-05-31 MED ORDER — ACETAMINOPHEN 325 MG PO TABS: 650.0000 mg | ORAL_TABLET | ORAL | Status: DC | PRN

## 2018-05-31 MED ORDER — VERAPAMIL HCL 2.5 MG/ML IV SOLN
INTRAVENOUS | Status: DC | PRN
  Administered 2018-05-31: 2.5 mg via INTRA_ARTERIAL

## 2018-05-31 MED ORDER — HEPARIN SODIUM (PORCINE) 1000 UNIT/ML IJ SOLN
INTRAMUSCULAR | Status: AC
  Filled 2018-05-31: qty 1

## 2018-05-31 MED ORDER — ASPIRIN 81 MG PO CHEW: 81.0000 mg | CHEWABLE_TABLET | ORAL | Status: DC

## 2018-05-31 MED ORDER — IOPAMIDOL (ISOVUE-300) INJECTION 61%
INTRAVENOUS | Status: DC | PRN
  Administered 2018-05-31: 125 mL via INTRA_ARTERIAL

## 2018-05-31 MED ORDER — FENTANYL CITRATE (PF) 100 MCG/2ML IJ SOLN
INTRAMUSCULAR | Status: AC
  Filled 2018-05-31: qty 2

## 2018-05-31 MED ORDER — HYDRALAZINE HCL 20 MG/ML IJ SOLN: 10.0000 mg | INTRAMUSCULAR | Status: DC | PRN

## 2018-05-31 SURGICAL SUPPLY — 9 items
CATH INFINITI 5 FR MPA2 (CATHETERS) ×2 IMPLANT
CATH INFINITI 5FR ANG PIGTAIL (CATHETERS) ×2 IMPLANT
CATH INFINITI 5FR TG (CATHETERS) ×2 IMPLANT
CATH INFINITI JR4 5F (CATHETERS) ×2 IMPLANT
DEVICE RAD TR BAND REGULAR (VASCULAR PRODUCTS) ×2 IMPLANT
GLIDESHEATH SLEND SS 6F .021 (SHEATH) ×2 IMPLANT
KIT MANI 3VAL PERCEP (MISCELLANEOUS) ×2 IMPLANT
PACK CARDIAC CATH (CUSTOM PROCEDURE TRAY) ×2 IMPLANT
WIRE ROSEN-J .035X260CM (WIRE) ×2 IMPLANT

## 2018-05-31 NOTE — Interval H&P Note (Signed)
Cath Lab Visit (complete for each Cath Lab visit)  Clinical Evaluation Leading to the Procedure:   ACS: No.  Non-ACS:    Anginal Classification: CCS IV  Anti-ischemic medical therapy: Minimal Therapy (1 class of medications)  Non-Invasive Test Results: High-risk stress test findings: cardiac mortality >3%/year  Prior CABG: Previous CABG      History and Physical Interval Note:  05/31/2018 11:10 AM  Edward Powell  has presented today for surgery, with the diagnosis of LT Cath    Chest pain  Abnormal stress test  The various methods of treatment have been discussed with the patient and family. After consideration of risks, benefits and other options for treatment, the patient has consented to  Procedure(s): LEFT HEART CATH AND CORONARY ANGIOGRAPHY (N/A) as a surgical intervention .  The patient's history has been reviewed, patient examined, no change in status, stable for surgery.  I have reviewed the patient's chart and labs.  Questions were answered to the patient's satisfaction.     Lorine BearsMuhammad Carlyn Lemke

## 2018-06-01 ENCOUNTER — Telehealth: Payer: Self-pay | Admitting: *Deleted

## 2018-06-01 ENCOUNTER — Encounter: Payer: Self-pay | Admitting: *Deleted

## 2018-06-01 MED ORDER — TICAGRELOR 90 MG PO TABS: 90.0000 mg | ORAL_TABLET | Freq: Two times a day (BID) | ORAL | 1 refills | Status: DC

## 2018-06-01 NOTE — Telephone Encounter (Signed)
-----   Message from Iran OuchMuhammad A Arida, MD sent at 05/31/2018  3:35 PM EST ----- This patient had cardiac catheterization today and needs staged PCI of the right coronary artery [facility].  No need for repeat labs. I do recommend switching Plavix to Brilinta 90 mg twice daily which can be started tomorrow.

## 2018-06-01 NOTE — Telephone Encounter (Signed)
Call placed to the patient. He has been advised to stop the Plavix and start Brilinta 90 mg twice daily. The prescription has been sent into the pharmacy for him.  The patient stated that he was currently out of PTO so therefore would rather have the cardiac catheterization on 12/26 so he won't have to take time off. Per Dr. Kirke CorinArida this would be okay as long as he was not having any symptoms.   Message has been left with scheduling to call back for 12/26.

## 2018-06-01 NOTE — Telephone Encounter (Signed)
Cardiac catheterization has been scheduled for 06/14/18 with Dr. Kirke CorinArida. The patient will arrive by 6:30 for the 7:30 am procedure.   Letter of instructions has been mailed to the patient and sent to MyChart.

## 2018-06-07 ENCOUNTER — Ambulatory Visit: Payer: BLUE CROSS/BLUE SHIELD | Admitting: Cardiovascular Disease

## 2018-06-07 ENCOUNTER — Ambulatory Visit: Payer: BLUE CROSS/BLUE SHIELD | Admitting: Family Medicine

## 2018-06-14 ENCOUNTER — Encounter
Admission: RE | Disposition: A | Discharge status: Home / Self Care | Payer: Self-pay | Source: Home / Self Care | Attending: Cardiovascular Disease

## 2018-06-14 ENCOUNTER — Ambulatory Visit
Admission: RE | Admit: 2018-06-14 | Discharge: 2018-06-15 | Disposition: A | Discharge status: Home / Self Care | Payer: BLUE CROSS/BLUE SHIELD | Attending: Cardiovascular Disease | Admitting: Cardiovascular Disease

## 2018-06-14 ENCOUNTER — Other Ambulatory Visit: Payer: Self-pay

## 2018-06-14 DIAGNOSIS — I110 Hypertensive heart disease with heart failure: Secondary | ICD-10-CM | POA: Diagnosis not present

## 2018-06-14 DIAGNOSIS — Z7982 Long term (current) use of aspirin: Secondary | ICD-10-CM | POA: Diagnosis not present

## 2018-06-14 DIAGNOSIS — E785 Hyperlipidemia, unspecified: Secondary | ICD-10-CM | POA: Diagnosis not present

## 2018-06-14 DIAGNOSIS — Z8249 Family history of ischemic heart disease and other diseases of the circulatory system: Secondary | ICD-10-CM | POA: Diagnosis not present

## 2018-06-14 DIAGNOSIS — I208 Other forms of angina pectoris: Secondary | ICD-10-CM | POA: Diagnosis not present

## 2018-06-14 DIAGNOSIS — R079 Chest pain, unspecified: Secondary | ICD-10-CM

## 2018-06-14 DIAGNOSIS — I2510 Atherosclerotic heart disease of native coronary artery without angina pectoris: Secondary | ICD-10-CM | POA: Diagnosis present

## 2018-06-14 DIAGNOSIS — I25118 Atherosclerotic heart disease of native coronary artery with other forms of angina pectoris: Secondary | ICD-10-CM | POA: Diagnosis present

## 2018-06-14 DIAGNOSIS — Z7902 Long term (current) use of antithrombotics/antiplatelets: Secondary | ICD-10-CM | POA: Insufficient documentation

## 2018-06-14 DIAGNOSIS — E669 Obesity, unspecified: Secondary | ICD-10-CM | POA: Diagnosis present

## 2018-06-14 DIAGNOSIS — Z823 Family history of stroke: Secondary | ICD-10-CM | POA: Diagnosis not present

## 2018-06-14 DIAGNOSIS — Z87891 Personal history of nicotine dependence: Secondary | ICD-10-CM | POA: Diagnosis not present

## 2018-06-14 DIAGNOSIS — R9439 Abnormal result of other cardiovascular function study: Secondary | ICD-10-CM | POA: Insufficient documentation

## 2018-06-14 DIAGNOSIS — E781 Pure hyperglyceridemia: Secondary | ICD-10-CM | POA: Insufficient documentation

## 2018-06-14 DIAGNOSIS — Z955 Presence of coronary angioplasty implant and graft: Secondary | ICD-10-CM | POA: Insufficient documentation

## 2018-06-14 DIAGNOSIS — I10 Essential (primary) hypertension: Secondary | ICD-10-CM | POA: Diagnosis present

## 2018-06-14 DIAGNOSIS — I25119 Atherosclerotic heart disease of native coronary artery with unspecified angina pectoris: Secondary | ICD-10-CM | POA: Diagnosis not present

## 2018-06-14 DIAGNOSIS — Z79899 Other long term (current) drug therapy: Secondary | ICD-10-CM | POA: Insufficient documentation

## 2018-06-14 DIAGNOSIS — I2089 Other forms of angina pectoris: Secondary | ICD-10-CM | POA: Diagnosis present

## 2018-06-14 DIAGNOSIS — I252 Old myocardial infarction: Secondary | ICD-10-CM | POA: Insufficient documentation

## 2018-06-14 DIAGNOSIS — Z951 Presence of aortocoronary bypass graft: Secondary | ICD-10-CM

## 2018-06-14 DIAGNOSIS — E66811 Obesity, class 1: Secondary | ICD-10-CM | POA: Diagnosis present

## 2018-06-14 DIAGNOSIS — I255 Ischemic cardiomyopathy: Secondary | ICD-10-CM

## 2018-06-14 DIAGNOSIS — I5030 Unspecified diastolic (congestive) heart failure: Secondary | ICD-10-CM | POA: Insufficient documentation

## 2018-06-14 HISTORY — PX: CORONARY STENT INTERVENTION: CATH118234

## 2018-06-14 LAB — POCT ACTIVATED CLOTTING TIME
ACTIVATED CLOTTING TIME: 285 s
Activated Clotting Time: 356 seconds

## 2018-06-14 SURGERY — CORONARY STENT INTERVENTION
Anesthesia: Moderate Sedation

## 2018-06-14 MED ORDER — ACETAMINOPHEN 325 MG PO TABS: 650.0000 mg | ORAL_TABLET | ORAL | Status: DC | PRN

## 2018-06-14 MED ORDER — ACETAMINOPHEN 325 MG PO TABS: 650.0000 mg | ORAL_TABLET | Freq: Four times a day (QID) | ORAL | Status: DC | PRN

## 2018-06-14 MED ORDER — ASPIRIN 81 MG PO CHEW: 81.0000 mg | CHEWABLE_TABLET | ORAL | Status: DC

## 2018-06-14 MED ORDER — SODIUM CHLORIDE 0.9 % IV SOLN
INTRAVENOUS | Status: DC
  Administered 2018-06-14: 1000 mL via INTRAVENOUS

## 2018-06-14 MED ORDER — HEPARIN SODIUM (PORCINE) 1000 UNIT/ML IJ SOLN
INTRAMUSCULAR | Status: DC | PRN
  Administered 2018-06-14: 10000 [IU] via INTRAVENOUS

## 2018-06-14 MED ORDER — ONDANSETRON HCL 4 MG/2ML IJ SOLN: 4.0000 mg | Freq: Four times a day (QID) | INTRAMUSCULAR | Status: DC | PRN

## 2018-06-14 MED ORDER — ATORVASTATIN CALCIUM 80 MG PO TABS
80.0000 mg | ORAL_TABLET | Freq: Every day | ORAL | Status: DC
  Filled 2018-06-14 (×2): qty 1
  Filled 2018-06-14: qty 4

## 2018-06-14 MED ORDER — MIDAZOLAM HCL 2 MG/2ML IJ SOLN
INTRAMUSCULAR | Status: DC | PRN
  Administered 2018-06-14 (×2): 1 mg via INTRAVENOUS

## 2018-06-14 MED ORDER — ASPIRIN EC 81 MG PO TBEC
81.0000 mg | DELAYED_RELEASE_TABLET | Freq: Every day | ORAL | Status: DC
  Administered 2018-06-15: 81 mg via ORAL
  Filled 2018-06-14: qty 1

## 2018-06-14 MED ORDER — SODIUM CHLORIDE 0.9 % IV SOLN: 250.0000 mL | INTRAVENOUS | Status: DC | PRN

## 2018-06-14 MED ORDER — IOPAMIDOL (ISOVUE-300) INJECTION 61%
INTRAVENOUS | Status: DC | PRN
  Administered 2018-06-14: 150 mL via INTRA_ARTERIAL

## 2018-06-14 MED ORDER — MIDAZOLAM HCL 2 MG/2ML IJ SOLN
INTRAMUSCULAR | Status: AC
  Filled 2018-06-14: qty 2

## 2018-06-14 MED ORDER — TICAGRELOR 90 MG PO TABS
90.0000 mg | ORAL_TABLET | Freq: Two times a day (BID) | ORAL | Status: DC
  Administered 2018-06-14 – 2018-06-15 (×2): 90 mg via ORAL
  Filled 2018-06-14 (×3): qty 1

## 2018-06-14 MED ORDER — SODIUM CHLORIDE 0.9% FLUSH
3.0000 mL | Freq: Two times a day (BID) | INTRAVENOUS | Status: DC
  Administered 2018-06-14 – 2018-06-15 (×2): 3 mL via INTRAVENOUS

## 2018-06-14 MED ORDER — SODIUM CHLORIDE 0.9% FLUSH: 3.0000 mL | INTRAVENOUS | Status: DC | PRN

## 2018-06-14 MED ORDER — CARVEDILOL 25 MG PO TABS
25.0000 mg | ORAL_TABLET | Freq: Two times a day (BID) | ORAL | Status: DC
  Administered 2018-06-15: 25 mg via ORAL
  Filled 2018-06-14 (×2): qty 1

## 2018-06-14 MED ORDER — HEPARIN SODIUM (PORCINE) 1000 UNIT/ML IJ SOLN
INTRAMUSCULAR | Status: AC
  Filled 2018-06-14: qty 1

## 2018-06-14 MED ORDER — LISINOPRIL 20 MG PO TABS
20.0000 mg | ORAL_TABLET | Freq: Every day | ORAL | Status: DC
  Administered 2018-06-15: 20 mg via ORAL
  Filled 2018-06-14: qty 1

## 2018-06-14 MED ORDER — HEPARIN (PORCINE) IN NACL 1000-0.9 UT/500ML-% IV SOLN
INTRAVENOUS | Status: AC
  Filled 2018-06-14: qty 1000

## 2018-06-14 MED ORDER — FENTANYL CITRATE (PF) 100 MCG/2ML IJ SOLN
INTRAMUSCULAR | Status: DC | PRN
  Administered 2018-06-14: 25 ug via INTRAVENOUS

## 2018-06-14 MED ORDER — VERAPAMIL HCL 2.5 MG/ML IV SOLN
INTRAVENOUS | Status: AC
  Filled 2018-06-14: qty 2

## 2018-06-14 MED ORDER — NITROGLYCERIN 5 MG/ML IV SOLN
INTRAVENOUS | Status: AC
  Filled 2018-06-14: qty 10

## 2018-06-14 MED ORDER — SODIUM CHLORIDE 0.9 % WEIGHT BASED INFUSION
1.0000 mL/kg/h | INTRAVENOUS | Status: AC
  Administered 2018-06-14 (×2): 1 mL/kg/h via INTRAVENOUS

## 2018-06-14 MED ORDER — SODIUM CHLORIDE 0.9% FLUSH: 3.0000 mL | Freq: Two times a day (BID) | INTRAVENOUS | Status: DC

## 2018-06-14 MED ORDER — VERAPAMIL HCL 2.5 MG/ML IV SOLN
INTRAVENOUS | Status: DC | PRN
  Administered 2018-06-14: 2.5 mg via INTRA_ARTERIAL

## 2018-06-14 MED ORDER — FENTANYL CITRATE (PF) 100 MCG/2ML IJ SOLN
INTRAMUSCULAR | Status: AC
  Filled 2018-06-14: qty 2

## 2018-06-14 SURGICAL SUPPLY — 15 items
BALLN TREK RX 2.5X20 (BALLOONS) ×2
BALLN ~~LOC~~ EUPHORA RX 3.0X20 (BALLOONS) ×2
BALLOON TREK RX 2.5X20 (BALLOONS) ×1 IMPLANT
BALLOON ~~LOC~~ EUPHORA RX 3.0X20 (BALLOONS) ×1 IMPLANT
CATH VISTA GUIDE 6FR JR4 (CATHETERS) ×2 IMPLANT
DEVICE INFLAT 30 PLUS (MISCELLANEOUS) ×2 IMPLANT
DEVICE RAD TR BAND REGULAR (VASCULAR PRODUCTS) ×2 IMPLANT
GLIDESHEATH SLEND SS 6F .021 (SHEATH) ×2 IMPLANT
KIT MANI 3VAL PERCEP (MISCELLANEOUS) ×2 IMPLANT
PACK CARDIAC CATH (CUSTOM PROCEDURE TRAY) ×2 IMPLANT
STENT RESOLUTE ONYX 2.75X12 (Permanent Stent) ×2 IMPLANT
STENT RESOLUTE ONYX 2.75X18 (Permanent Stent) ×2 IMPLANT
STENT RESOLUTE ONYX 2.75X38 (Permanent Stent) ×2 IMPLANT
WIRE ROSEN-J .035X260CM (WIRE) ×2 IMPLANT
WIRE RUNTHROUGH .014X180CM (WIRE) ×2 IMPLANT

## 2018-06-14 NOTE — Care Management Note (Signed)
Case Management Note  Patient Details  Name: Edward SpeckingDarin W Rubendall MRN: [id number] Date of Birth: [birth year]  Subjective/Objective:       Patient is from home with girlfriend.  Scheduled cardiac catheterization with stent placement.  Recently prescribed Brilinta.  RNCM to assess.  Patient is independent from home.  Independent in all adls, denies issues accessing medical care, obtaining medications or with transportation.  Current with PCP.  He has [facility] his Bril[facility] at [facility] [place] and has a zero dollar co-pay.   No discharge needs identified at present by care manager or members of care team.               Action/Plan:   Expected Discharge Date:  06/15/18               Expected Discharge Plan:  Home/Self Care  In-House Referral:     Discharge planning Services  CM Consult  Post Acute Care Choice:    Choice offered to:     DME Arranged:    DME Agency:     HH Arranged:    HH Agency:     Status of Service:  Completed, signed off  If discussed at [employer] Length of Stay Meetings, dates discussed:    Additional Comments:  Sherren KernsJennifer L Leola Fiore, RN 06/14/2018, 2:24 PM

## 2018-06-14 NOTE — Progress Notes (Signed)
Admitted from cardiac cath following a PCI procedure. Patient awake and alert.  Denies pain.  R radial site is clean and dry.

## 2018-06-14 NOTE — Discharge Instructions (Signed)
Some of your medications have changed during this admission. Please review your medication list carefully for these changes. It is extremely important that you take both the aspirin 81 mg daily and Brilinta 90 mg twice daily without missing any doses. If, for any reason, you have a difficult time in getting Brilinta, you must call our office anytime of day or night, 7 days per week, for further advice on what to do.     Coronary Artery Disease, Male  Coronary artery disease (CAD) is a condition in which the arteries that lead to the heart (coronary arteries) become narrow or blocked. The narrowing or blockage can lead to decreased blood flow to the heart. Prolonged reduced blood flow can cause a heart attack (myocardial infarction or MI). This condition may also be called coronary heart disease. Because CAD is the leading cause of death in men, it is important to understand what causes this condition and how it is treated. What are the causes? CAD is most often caused by atherosclerosis. This is the buildup of fat and cholesterol (plaque) on the inside of the arteries. Over time, the plaque may narrow or block the artery, reducing blood flow to the heart. Plaque can also become weak and break off within a coronary artery and cause a sudden blockage. Other less common causes of CAD include:  An embolism or blood clot in a coronary artery.  A tearing of the artery (spontaneous coronary artery dissection).  An aneurysm.  Inflammation (vasculitis) in the artery wall. What increases the risk? The following factors may make you more likely to develop this condition:  Age. Men over age 49 are at a greater risk of CAD.  Family history of CAD.  Gender. Men often develop CAD earlier in life than women.  High blood pressure (hypertension).  Diabetes.  High cholesterol levels.  Tobacco use.  Excessive alcohol use.  Lack of exercise.  A diet high in saturated and trans fats, such as  fried food and processed meat. Other possible risk factors include:  High stress levels.  Depression.  Obesity.  Sleep apnea. What are the signs or symptoms? Many people do not have any symptoms during the early stages of CAD. As the condition progresses, symptoms may include:  Chest pain (angina). The pain can: ? Feel like a crushing or squeezing, or a tightness, pressure, fullness, or heaviness in the chest. ? Last more than a few minutes or can stop and recur. The pain tends to get worse with exercise or stress and to fade with rest.  Pain in the arms, neck, jaw, or back.  Unexplained heartburn or indigestion.  Shortness of breath.  Nausea or vomiting.  Sudden light-headedness.  Sudden cold sweats.  Fluttering or fast heartbeat (palpitations). How is this diagnosed? This condition is diagnosed based on:  Your family and medical history.  A physical exam.  Tests, including: ? A test to check the electrical signals in your heart (electrocardiogram). ? Exercise stress test. This looks for signs of blockage when the heart is stressed with exercise, such as running on a treadmill. ? Pharmacologic stress test. This test looks for signs of blockage when the heart is being stressed with a medicine. ? Blood tests. ? Coronary angiogram. This is a procedure to look at the coronary arteries to see if there is any blockage. During this test, a dye is injected into your arteries so they appear on an X-ray. ? A test that uses sound waves to take a  picture of your heart (echocardiogram). ? Chest X-ray. How is this treated? This condition may be treated by:  Healthy lifestyle changes to reduce risk factors.  Medicines such as: ? Antiplatelet medicines and blood-thinning medicines, such as aspirin. These help to prevent blood clots. ? Nitroglycerin. ? Blood pressure medicines. ? Cholesterol-lowering medicine.  Coronary angioplasty and stenting. During this procedure, a thin,  flexible tube is inserted through a blood vessel and into a blocked artery. A balloon or similar device on the end of the tube is inflated to open up the artery. In some cases, a small, mesh tube (stent) is inserted into the artery to keep it open.  Coronary artery bypass surgery. During this surgery, veins or arteries from other parts of the body are used to create a bypass around the blockage and allow blood to reach your heart. Follow these instructions at home: Medicines  Take over-the-counter and prescription medicines only as told by your health care provider.  Do not take the following medicines unless your health care provider approves: ? NSAIDs, such as ibuprofen, naproxen, or celecoxib. ? Vitamin supplements that contain vitamin A, vitamin E, or both. Lifestyle  Follow an exercise program approved by your health care provider. Aim for 150 minutes of moderate exercise or 75 minutes of vigorous exercise each week.  Maintain a healthy weight or lose weight as approved by your health care provider.  Rest when you are tired.  Learn to manage stress or try to limit your stress. Ask your health care provider for suggestions if you need help.  Get screened for depression and seek treatment, if needed.  Do not use any products that contain nicotine or tobacco, such as cigarettes and e-cigarettes. If you need help quitting, ask your health care provider.  Do not use illegal drugs. Eating and drinking  Follow a heart-healthy diet. A dietitian can help educate you about healthy food options and changes. In general, eat plenty of fruits and vegetables, lean meats, and whole grains.  Avoid foods high in: ? Sugar. ? Salt (sodium). ? Saturated fat, such as processed or fatty meat. ? Trans fat, such as fried foods.  Use healthy cooking methods such as roasting, grilling, broiling, baking, poaching, steaming, or stir-frying.  If you drink alcohol, and your health care provider approves,  limit your alcohol intake to no more than 2 drinks per day. One drink equals 12 ounces of beer, 5 ounces of wine, or 1 ounces of hard liquor. General instructions  Manage any other health conditions, such as hypertension and diabetes. These conditions affect your heart.  Your health care provider may ask you to monitor your blood pressure. Ideally, your blood pressure should be below 130/80.  Keep all follow-up visits as told by your health care provider. This is important. Get help right away if:  You have pain in your chest, neck, arm, jaw, stomach, or back that: ? Lasts more than a few minutes. ? Is recurring. ? Is not relieved by taking medicine under your tongue (sublingualnitroglycerin).  You have too much (profuse) sweating without cause.  You have unexplained: ? Heartburn or indigestion. ? Shortness of breath or difficulty breathing. ? Fluttering or fast heartbeat (palpitations). ? Nausea or vomiting. ? Fatigue. ? Feelings of nervousness or anxiety. ? Weakness. ? Diarrhea.  You have sudden light-headedness or dizziness.  You faint.  You feel like hurting yourself or think about taking your own life. These symptoms may represent a serious problem that is an emergency. Do  not wait to see if the symptoms will go away. Get medical help right away. Call your local emergency services (911 in the [place] Do not drive yourself to the hospital. Summary  Coronary artery disease (CAD) is a process in which the arteries that lead to the heart (coronary arteries) become narrow or blocked. The narrowing or blockage can lead to a heart attack.  Many people do not have any symptoms during the early stages of CAD. This is called "silent CAD."  CAD can be treated with lifestyle changes, medicines, surgery, or a combination of these treatments. This information is not intended to replace advice given to you by your health care provider. Make sure you discuss any questions you have with  your health care provider. Document Released: 01/01/2014 Document Revised: 05/27/2016 Document Reviewed: 05/27/2016 Elsevier Interactive Patient Education  2019 [employer].     Cardiac Rehabilitation What is cardiac rehabilitation? Cardiac rehabilitation is a treatment program that helps improve the health and well-being of people who have heart problems. Cardiac rehabilitation includes exercise training, education, and counseling to help you get stronger and return to an active lifestyle. This program can help you get better faster and reduce any future hospital stays. Why might I need cardiac rehabilitation? Cardiac rehabilitation programs can help when you have or have had:  A heart attack.  Heart failure.  Peripheral artery disease.  Coronary artery disease.  Angina.  Lung or breathing problems. Cardiac rehabilitation programs are also used when you have had:  Coronary artery bypass graft surgery.  Heart valve replacement.  Heart stent placement.  Heart transplant.  Aneurysm repair. What are the benefits of cardiac rehabilitation? Cardiac rehabilitation can help:  Reduce problems like chest pain and trouble breathing.  Change risk factors that contribute to heart disease, such as: ? Smoking. ? High blood pressure. ? High cholesterol. ? Diabetes. ? Being out of shape or not active. ? Weighing more than 30% higher than your ideal weight. ? Diet.  Improve your mental outlook so you feel: ? More hopeful. ? Better about yourself. ? More confident about taking care of yourself.  Get support from health experts as well as other people with similar problems.  Learn how to manage and understand your medicines.  Teach your family about your condition and how to participate in your recovery. What happens in cardiac rehabilitation? You will be assessed by a cardiac rehabilitation team. They will check your health history and do a physical exam. You may need  blood tests, stress tests, and other evaluations to make sure that you are ready to start cardiac rehabilitation. The cardiac rehabilitation team works with you to make a plan based on your health and goals. Your program will be tailored to fit you and your needs and may change as you progress. You may work with a health care team that includes:  Doctors.  Nurses.  Dietitians.  Psychologists.  Exercise specialists.  Physical and occupational therapists. What are the phases of cardiac rehabilitation? A cardiac rehabilitation program is often divided into phases. You advance from one phase to the next. Phase One  This phase starts while you are still in the hospital. You may start by walking in your room and then in the hall. You may start some simple exercises with a therapist. Phase Two  This phase begins when you go home or to another facility. This phase may last 8-12 weeks. You will travel to a cardiac rehabilitation center or another place where rehabilitation is  offered. You will slowly increase your activity level while being closely watched by a nurse or therapist. Exercises may include a combination of strength or resistance training and cardio or aerobic movement on a treadmill or other machines. Your condition will determine how often and how long these sessions last. In phase two, you may learn how to cook healthy meals, control your blood sugar, and manage your medicines. You may need help with scheduling or planning how and when to take your medicines. If you have questions about your medicines, it is very important that you talk to your health care provider. Phase Three This phase continues for the rest of your life. There will be less supervision. You may still participate in cardiac rehabilitation activities or become part of a group in your community. You may benefit from talking about your experience with other people who are facing similar challenges. Get help right away  if:  You have severe chest discomfort, especially if the pain is crushing or pressure-like and spreads to your arms, back, neck, or jaw. Do not wait to see if the pain will go away.  You have weakness or numbness in your face, arms, or legs, especially on one side of the body.  Your speech is slurred.  You are confused.  You have a sudden severe headache or loss of vision.  You have shortness of breath.  You are sweating and have nausea.  You feel dizzy or faint.  You are fatigued. These symptoms may represent a serious problem that is an emergency. Do not wait to see if the symptoms will go away. Get medical help right away. Call your local emergency services (911 in the [place] Do not drive yourself to the hospital. This information is not intended to replace advice given to you by your health care provider. Make sure you discuss any questions you have with your health care provider. Document Released: 03/15/2008 Document Revised: 02/27/2018 Document Reviewed: 04/20/2015 Elsevier Interactive Patient Education  2019 [employer]      Radial Site Care  This sheet gives you information about how to care for yourself after your procedure. Your health care provider may also give you more specific instructions. If you have problems or questions, contact your health care provider. What can I expect after the procedure? After the procedure, it is common to have:  Bruising and tenderness at the catheter insertion area. Follow these instructions at home: Medicines  Take over-the-counter and prescription medicines only as told by your health care provider. Insertion site care  Follow instructions from your health care provider about how to take care of your insertion site. Make sure you: ? Wash your hands with soap and water before you change your bandage (dressing). If soap and water are not available, use hand sanitizer. ? Change your dressing as told by your health care  provider. ? Leave stitches (sutures), skin glue, or adhesive strips in place. These skin closures may need to stay in place for 2 weeks or longer. If adhesive strip edges start to loosen and curl up, you may trim the loose edges. Do not remove adhesive strips completely unless your health care provider tells you to do that.  Check your insertion site every day for signs of infection. Check for: ? Redness, swelling, or pain. ? Fluid or blood. ? Pus or a bad smell. ? Warmth.  Do not take baths, swim, or use a hot tub until your health care provider approves.  You may shower 24-48  hours after the procedure, or as directed by your health care provider. ? Remove the dressing and gently wash the site with plain soap and water. ? Pat the area dry with a clean towel. ? Do not rub the site. That could cause bleeding.  Do not apply powder or lotion to the site. Activity   For 24 hours after the procedure, or as directed by your health care provider: ? Do not flex or bend the affected arm. ? Do not push or pull heavy objects with the affected arm. ? Do not drive yourself home from the hospital or clinic. You may drive 24 hours after the procedure unless your health care provider tells you not to. ? Do not operate machinery or power tools.  Do not lift anything that is heavier than 10 lb (4.5 kg), or the limit that you are told, until your health care provider says that it is safe.  Ask your health care provider when it is okay to: ? Return to work or school. ? Resume usual physical activities or sports. ? Resume sexual activity. General instructions  If the catheter site starts to bleed, raise your arm and put firm pressure on the site. If the bleeding does not stop, get help right away. This is a medical emergency.  If you went home on the same day as your procedure, a responsible adult should be with you for the first 24 hours after you arrive home.  Keep all follow-up visits as told by  your health care provider. This is important. Contact a health care provider if:  You have a fever.  You have redness, swelling, or yellow drainage around your insertion site. Get help right away if:  You have unusual pain at the radial site.  The catheter insertion area swells very fast.  The insertion area is bleeding, and the bleeding does not stop when you hold steady pressure on the area.  Your arm or hand becomes pale, cool, tingly, or numb. These symptoms may represent a serious problem that is an emergency. Do not wait to see if the symptoms will go away. Get medical help right away. Call your local emergency services (911 in the [place] Do not drive yourself to the hospital. Summary  After the procedure, it is common to have bruising and tenderness at the site.  Follow instructions from your health care provider about how to take care of your radial site wound. Check the wound every day for signs of infection.  Do not lift anything that is heavier than 10 lb (4.5 kg), or the limit that you are told, until your health care provider says that it is safe. This information is not intended to replace advice given to you by your health care provider. Make sure you discuss any questions you have with your health care provider. Document Released: 07/09/2010 Document Revised: 07/12/2017 Document Reviewed: 07/12/2017 Elsevier Interactive Patient Education  2019 [employer].

## 2018-06-14 NOTE — Discharge Summary (Signed)
Discharge Summary    Patient ID: Edward Powell  MRN: [id number], DOB/AGE: 1970/11/02 47 y.o.  Admit Date: 06/14/2018 Discharge Date: 06/15/2018  Primary Care Provider: Arnetha Courser, MD Primary Cardiologist: Dr. Fletcher Anon, MD  Discharge Diagnoses    Principal Problem:   Effort angina [facility]) Active Problems:   Coronary artery disease involving native heart with angina pectoris ([place])   Ischemic cardiomyopathy   S/P CABG x 4   Essential hypertension   Obesity (BMI 30.0-34.9)   Hyperlipidemia LDL goal <70   Allergies No Known Allergies   History of Present Illness     47 year old male with history of CAD with a NSTEMI in 12/2017 s/p 4-vessel CABG (LIMA-LAD, sequential SVG-ramus and distal LCx, SVG-rPDA) on 01/10/2018, HFrEF secondary to ICM, severe HLD, HTN, tobacco and alcohol abuse, and obesity who presented to [facility] for staged PCI as outlined below.   The patient was admitted to the hospital in 12/2017 with a NSTEMI with LHC at that time showing 3-vessel CAD with an normal EF. Echo showed an EF of 55-60%, Gr1DD, no significant valvular abnormalities. He underwent successful 3-vessel CABG on 01/10/2018 with details as above. Following his CABG, he decreased his alcohol consumption and quit smoking. He was seen in the office on 12/10 with recurrent episodes of chest pain that were described as tightness. He initially thought these episodes were related to certain foods, such as Janine Limbo or [employer]. However, the pain was exertional. In this setting, he underwent a Lexiscan Myoview on 05/25/2018 that was high risk with evidence of a large inferolateral and lateral ischemia with more than 1 mm ST segment depression at low level exercise. He was noted to have initially started the treadmill, though his test was changed to a Lexiscan due to his inability to achieve [facility] as he took Coreg on the morning of his stress test. His EF as reduced at 23% on Myoview. Because of this, he underwent LHC on  05/31/2018 that showed significant 3-vessel CAD with a patent LIMA to LAD and patent SVG to ramus. The jump graft from SVG to OM3 and SVG to rPDA were occluded. The LCx was chronically occluded with collaterals. The RCA was diffusely disease in multiple areas. He was noted to have a moderately reduced LVSF with an EF of 35-45% and moderately elevated LVEDP. Given the need for multiple stents along the native RCA, it was felt this would be best accomplished in a staged procedure.   Hospital Course     Consultants: care manager    CAD s/p CABG with complex staged PCI to the native RCA: The patient presented to [facility] on 06/14/2018 for planned PCI as outlined above. Her underwent LHC via the right radial artery that showed 50% distal left main eccentric stenosis, ostial LAD 60% eccentric stenosis, proximal to mid LAD 80% stenosis with a focal napkin ring lesion at the takeoff of the first major septal branch, proximal LCx 100% stenosis, OM2 was filled by collaterals from the distal LAD, proximal RCA 70% stenosis, mid RCA -1 lesion 40% stenosis, mid RCA-2 lesion 99% stenosis, distal RCA 85% stenosis. The LIMA to mid LAD was patent, as well the SVG -ramus. Otherwise, he had an occluded jump-graft to the OM and SVG to rPDA. He underwent successful complex PCI/DES with 3 drug eluting stents to the RCA with recommendation to remain on indefinite DAPT, if possible. (See below for stent details). Post-cath labs were stable. He has ambulated without issues.   HFrEF  secondary to ICM: Prior EF normal at the time of his NSTEMI in 12/2017 with most recent EF noted to be reduced by LV gram on 05/31/2018. Continue Coreg and lisinopril. CHF education. In follow up consider 36 hour ACEi wash out with transition to [employer]. Consider low dose spironolactone in follow up.  Plan for repeat limited echo in outpatient follow up in ~ 3 months following intervention of the RCA, as well as optimization of medications as above, to  evaluate for improvement in EF. CHF education.   HTN: Blood pressure is reasonably controlled. Continue current medications as above.   HLD: Most recent lipid panel from 02/2018 showed a TG of 245 with an LDL of 54 (improved from 207, five months prior). Most recent LFT from 04/2018 was normal. Remains on Lipitor 80 mg. In outpatient follow, consider addition of Vescepa.   Obesity: Weight loss is advised.   Alcohol/tobacco abuse: Complete cessation is advised.   The patient's right radial cath site has been examined is healing well without issues at this time. The patient has been seen by Dr. Fletcher Anon and felt to be stable for discharge today. All follow up appointments have been made. Discharge medications are listed below. Prescriptions have been reviewed with the patient and printed/sent in to their pharmacy.  _____________  Discharge Vitals Blood pressure 133/90, pulse 73, temperature 98 F (36.7 C), temperature source Oral, resp. rate 18, height '5\' 8"'  (1.727 m), weight 89 kg, SpO2 97 %.  Filed Weights   06/14/18 0645 06/14/18 1240  Weight: 88.5 kg 89 kg    Labs & Radiologic Studies    CBC Recent Labs    06/15/18 0555  WBC 8.7  HGB 14.4  HCT 43.8  MCV 93.6  PLT 836   Basic Metabolic Panel Recent Labs    06/15/18 0555  NA 137  K 3.9  CL 111  CO2 18*  GLUCOSE 103*  BUN 14  CREATININE 0.65  CALCIUM 9.3   Liver Function Tests No results for input(s): AST, ALT, ALKPHOS, BILITOT, PROT, ALBUMIN in the last 72 hours. No results for input(s): LIPASE, AMYLASE in the last 72 hours. Cardiac Enzymes No results for input(s): CKTOTAL, CKMB, CKMBINDEX, TROPONINI in the last 72 hours. BNP Invalid input(s): POCBNP D-Dimer No results for input(s): DDIMER in the last 72 hours. Hemoglobin A1C No results for input(s): HGBA1C in the last 72 hours. Fasting Lipid Panel No results for input(s): CHOL, HDL, LDLCALC, TRIG, CHOLHDL, LDLDIRECT in the last 72 hours. Thyroid Function  Tests No results for input(s): TSH, T4TOTAL, T3FREE, THYROIDAB in the last 72 hours.  Invalid input(s): FREET3 _____________  Nm Myocar Multi W/spect W/wall Motion / Ef  Result Date: 05/25/2018 Pharmacological myocardial perfusion imaging study with large region of ischemia noted in the inferolateral and lateral wall Fixed defect noted in the basal to mid/distal inferior wall Hypokinesis of the inferior, inferolateral, inferoseptal wall EF estimated at 23% ST depressions greater than 1 mm concerning for ischemia at peak stress extending into recovery. Attempt to perform treadmill study was unsuccessful as patient had taken his beta-blocker that morning and unable to achieve adequate heart rate High risk scan given ischemia as detailed above and severely depressed ejection fraction Signed, Esmond Plants, MD, Ph.D [facility]    Diagnostic Studies/Procedures   PCI 06/14/2018: Coronary Findings   Diagnostic  Dominance: Right  Left Main  Vessel is large.  Dist LM lesion 50% stenosed  Dist LM lesion is 50% stenosed. The lesion is eccentric.  Left Anterior Descending  Vessel is large.  Ost LAD lesion 60% stenosed  Ost LAD lesion is 60% stenosed. The lesion is eccentric.  Prox LAD to Mid LAD lesion 80% stenosed  Prox LAD to Mid LAD lesion is 80% stenosed. Focal napkin ring lesion at at takeoff of first major septal branch.  First Diagonal Branch  Vessel is small in size.  Second Diagonal Branch  Vessel is small in size.  Third Diagonal Branch  Vessel is small in size.  Ramus Intermedius  Vessel is moderate in size. Vessel is angiographically normal.  Left Circumflex  Vessel is moderate in size.  Prox Cx lesion 100% stenosed  Prox Cx lesion is 100% stenosed.  Second Obtuse Marginal Branch  Vessel is small in size.  Collaterals  2nd Mrg filled by collaterals from Dist LAD.    Right Coronary Artery  Vessel is moderate in size.  Prox RCA lesion 70% stenosed  Prox RCA lesion is 70%  stenosed.  Mid RCA-1 lesion 40% stenosed  Mid RCA-1 lesion is 40% stenosed.  Mid RCA-2 lesion 99% stenosed  Mid RCA-2 lesion is 99% stenosed.  Dist RCA lesion 85% stenosed  Dist RCA lesion is 85% stenosed. The lesion is not complex (non high-C).  Graft to Ramus  Y-Graft Branch to 2nd Mrg  Prox Graft to Mid Graft lesion 100% stenosed  Prox Graft to Mid Graft lesion is 100% stenosed.  LIMA LIMA Graft to Mid LAD  LIMA and is normal in caliber. The graft exhibits no disease.  saphenous Graft to RPDA  SVG.  Origin lesion 100% stenosed  Origin lesion is 100% stenosed.  Intervention   Prox RCA lesion  Stent  Lesion length: 15 mm. Pre-stent angioplasty was not performed. A drug-eluting stent was successfully placed using a [place] [id number]. Maximum pressure: 14 atm. Inflation time: 20 sec. Post-stent angioplasty was performed using a BALLOON [place] EUPHORA RX3.0X20. Maximum pressure: 12 atm. Inflation time: 20 sec.  Post-Intervention Lesion Assessment  The intervention was successful. Pre-interventional TIMI flow is 3. Post-intervention TIMI flow is 3. No complications occurred at this lesion.  There is a 0% residual stenosis post intervention.  Mid RCA-1 lesion  Stent (Also treats lesions: Mid RCA-2)  Lesion length: 36 mm. Pre-stent angioplasty was performed using a BALLOON [place] RX 2.5X20. Maximum pressure: 10 atm. Inflation time: 20 sec. A drug-eluting stent was successfully placed using a [place] [id number]. Maximum pressure: 12 atm. Inflation time: 20 sec. Post-stent angioplasty was performed using a BALLOON [place] EUPHORA RX3.0X20. Maximum pressure: 14 atm. Inflation time: 20 sec.  Post-Intervention Lesion Assessment  The intervention was successful. Pre-interventional TIMI flow is 1. Post-intervention TIMI flow is 3. No complications occurred at this lesion.  There is a 0% residual stenosis post intervention.  Mid RCA-2 lesion  Stent (Also treats lesions: Mid RCA-1)  Lesion  length: 36 mm. Pre-stent angioplasty was performed using a BALLOON [place]RE[place] RX 2.5X20. Maximum pressure: 10 atm. Inflation time: 20 sec. A drug-eluting stent was successfully placed using a [place] [id number]. Maximum pressure: 12 atm. Inflation time: 20 sec. Post-stent angioplasty was performed using a BALLOON [place] EUPHORA RX3.0X20. Maximum pressure: 14 atm. Inflation time: 20 sec.  Post-Intervention Lesion Assessment  The intervention was successful. Pre-interventional TIMI flow is 1. Post-intervention TIMI flow is 3. No complications occurred at this lesion.  There is a 0% residual stenosis post intervention.  Dist RCA lesion  Stent  Lesion length: 10 mm. [place]  guide catheter was inserted. Lesion crossed with guidewire using a [place] .[id number]. Pre-stent angioplasty was not performed. A drug-eluting stent was successfully placed using a [place] [id number]. Maximum pressure: 12 atm. Inflation time: 20 sec. Post-stent angioplasty was not performed.  Post-Intervention Lesion Assessment  The intervention was successful. Pre-interventional TIMI flow is 1. Post-intervention TIMI flow is 3. No complications occurred at this lesion.  There is a 0% residual stenosis post intervention.  Coronary Diagrams   Diagnostic  Dominance: Right    Intervention     Conclusion     Dist LM lesion is 50% stenosed.  Ost LAD lesion is 60% stenosed.  Prox LAD to Mid LAD lesion is 80% stenosed.  Prox Cx lesion is 100% stenosed.  Mid RCA-1 lesion is 40% stenosed.  Dist RCA lesion is 85% stenosed.  Prox RCA lesion is 70% stenosed.  Mid RCA-2 lesion is 99% stenosed.  Prox Graft to Mid Graft lesion is 100% stenosed.  LIMA and is normal in caliber.  The graft exhibits no disease.  SVG.  Origin lesion is 100% stenosed.  Post intervention, there is a 0% residual stenosis.  A drug-eluting stent was successfully placed using a [place]  [id number].  A drug-eluting stent was successfully placed using a [place] [id number].  Post intervention, there is a 0% residual stenosis.  Post intervention, there is a 0% residual stenosis.  A drug-eluting stent was successfully placed using a [place] [id number].  Post intervention, there is a 0% residual stenosis.   Successful complex angioplasty and 3 drug-eluting stent placement to the right coronary artery.  Recommendations: Dual antiplatelet therapy indefinitely if possible .  Aggressive treatment of risk factors and cardiac rehab.  _____________   Disposition   Pt is being discharged home today in good condition.  Follow-up Plans & Appointments    Follow-up Information    Wellington Hampshire, MD Follow up on 07/05/2018.   Specialty:  Cardiology Why:  Appointment time 11:20 AM Contact information: [address] [place] [address] [phone]          Discharge Instructions    AMB Referral to Cardiac Rehabilitation - Phase II   Complete by:  As directed    Diagnosis:  Coronary Stents   Diet - low sodium heart healthy   Complete by:  As directed    Increase activity slowly   Complete by:  As directed       Discharge Medications   Allergies as of 06/15/2018   No Known Allergies     Medication List    TAKE these medications   acetaminophen 325 MG tablet Commonly known as:  TYLENOL Take 2 tablets (650 mg total) by mouth every 6 (six) hours as needed for mild pain (or Fever >/= 101).   aspirin 81 MG EC tablet Take 1 tablet (81 mg total) by mouth daily.   atorvastatin 80 MG tablet Commonly known as:  LIPITOR Take 1 tablet (80 mg total) by mouth daily at 6 PM.   carvedilol 25 MG tablet Commonly known as:  COREG Take 1 tablet (25 mg total) by mouth 2 (two) times daily with a meal.   lisinopril 20 MG tablet Commonly known as:  PRINIVIL,ZESTRIL Take 1 tablet (20 mg total) by mouth daily.   ticagrelor 90 MG  Tabs tablet Commonly known as:  BRILINTA Take 1 tablet (90 mg total) by mouth 2 (two) times daily.  Aspirin prescribed at discharge?  Yes High Intensity Statin Prescribed? (Lipitor 40-53m or Crestor 20-464m: Yes Beta Blocker Prescribed? Yes For EF <40%, was ACEI/ARB Prescribed? No: On ACEi, though EF > 40% ADP Receptor Inhibitor Prescribed? (i.e. Plavix etc.-Includes Medically Managed Patients): Yes For EF <40%, Aldosterone Inhibitor Prescribed? No: EF . 40% Was EF assessed during THIS hospitalization? Yes Was Cardiac Rehab II ordered? (Included Medically managed Patients): Yes   Outstanding Labs/Studies   none  Duration of Discharge Encounter   Greater than 30 minutes including physician time.  Signed, RyRise MuPA-C [facility] Pager: [phone]/27/2019, 7:33 AM

## 2018-06-14 NOTE — Interval H&P Note (Signed)
Cath Lab Visit (complete for each Cath Lab visit)  Clinical Evaluation Leading to the Procedure:   ACS: No.  Non-ACS:    Anginal Classification: CCS III  Anti-ischemic medical therapy: Minimal Therapy (1 class of medications)  Non-Invasive Test Results: High-risk stress test findings: cardiac mortality >3%/year  Prior CABG: Previous CABG      History and Physical Interval Note:  06/14/2018 7:40 AM  Edward Powell  has presented today for surgery, with the diagnosis of PCI   Stent RT Coronary Artery   Chest Pain  The various methods of treatment have been discussed with the patient and family. After consideration of risks, benefits and other options for treatment, the patient has consented to  Procedure(s): CORONARY STENT INTERVENTION (N/A) as a surgical intervention .  The patient's history has been reviewed, patient examined, no change in status, stable for surgery.  I have reviewed the patient's chart and labs.  Questions were answered to the patient's satisfaction.     Lorine BearsMuhammad Aretha Levi

## 2018-06-15 ENCOUNTER — Encounter: Payer: Self-pay | Admitting: Family Medicine

## 2018-06-15 DIAGNOSIS — E785 Hyperlipidemia, unspecified: Secondary | ICD-10-CM | POA: Diagnosis not present

## 2018-06-15 DIAGNOSIS — Z79899 Other long term (current) drug therapy: Secondary | ICD-10-CM | POA: Diagnosis not present

## 2018-06-15 DIAGNOSIS — Z7982 Long term (current) use of aspirin: Secondary | ICD-10-CM | POA: Diagnosis not present

## 2018-06-15 DIAGNOSIS — I25119 Atherosclerotic heart disease of native coronary artery with unspecified angina pectoris: Secondary | ICD-10-CM | POA: Diagnosis not present

## 2018-06-15 DIAGNOSIS — I208 Other forms of angina pectoris: Secondary | ICD-10-CM

## 2018-06-15 DIAGNOSIS — E781 Pure hyperglyceridemia: Secondary | ICD-10-CM | POA: Diagnosis not present

## 2018-06-15 DIAGNOSIS — I5030 Unspecified diastolic (congestive) heart failure: Secondary | ICD-10-CM | POA: Diagnosis not present

## 2018-06-15 DIAGNOSIS — Z951 Presence of aortocoronary bypass graft: Secondary | ICD-10-CM | POA: Diagnosis not present

## 2018-06-15 DIAGNOSIS — I110 Hypertensive heart disease with heart failure: Secondary | ICD-10-CM | POA: Diagnosis not present

## 2018-06-15 DIAGNOSIS — Z7902 Long term (current) use of antithrombotics/antiplatelets: Secondary | ICD-10-CM | POA: Diagnosis not present

## 2018-06-15 DIAGNOSIS — Z823 Family history of stroke: Secondary | ICD-10-CM | POA: Diagnosis not present

## 2018-06-15 DIAGNOSIS — Z8249 Family history of ischemic heart disease and other diseases of the circulatory system: Secondary | ICD-10-CM | POA: Diagnosis not present

## 2018-06-15 DIAGNOSIS — I25118 Atherosclerotic heart disease of native coronary artery with other forms of angina pectoris: Secondary | ICD-10-CM | POA: Diagnosis not present

## 2018-06-15 DIAGNOSIS — I252 Old myocardial infarction: Secondary | ICD-10-CM | POA: Diagnosis not present

## 2018-06-15 DIAGNOSIS — R9439 Abnormal result of other cardiovascular function study: Secondary | ICD-10-CM | POA: Diagnosis not present

## 2018-06-15 DIAGNOSIS — Z955 Presence of coronary angioplasty implant and graft: Secondary | ICD-10-CM | POA: Diagnosis not present

## 2018-06-15 DIAGNOSIS — Z87891 Personal history of nicotine dependence: Secondary | ICD-10-CM | POA: Diagnosis not present

## 2018-06-15 HISTORY — DX: Presence of coronary angioplasty implant and graft: Z95.5

## 2018-06-15 LAB — CBC
HCT: 43.8 % (ref 39.0–52.0)
Hemoglobin: 14.4 g/dL (ref 13.0–17.0)
MCH: 30.8 pg (ref 26.0–34.0)
MCHC: 32.9 g/dL (ref 30.0–36.0)
MCV: 93.6 fL (ref 80.0–100.0)
Platelets: 205 10*3/uL (ref 150–400)
RBC: 4.68 MIL/uL (ref 4.22–5.81)
RDW: 14 % (ref 11.5–15.5)
WBC: 8.7 10*3/uL (ref 4.0–10.5)
nRBC: 0 % (ref 0.0–0.2)

## 2018-06-15 LAB — BASIC METABOLIC PANEL
Anion gap: 8 (ref 5–15)
BUN: 14 mg/dL (ref 6–20)
CO2: 18 mmol/L — ABNORMAL LOW (ref 22–32)
Calcium: 9.3 mg/dL (ref 8.9–10.3)
Chloride: 111 mmol/L (ref 98–111)
Creatinine, Ser: 0.65 mg/dL (ref 0.61–1.24)
GFR calc Af Amer: >60 mL/min (ref >60)
GFR calc non Af Amer: >60 mL/min (ref >60)
Glucose, Bld: 103 mg/dL — ABNORMAL HIGH (ref 70–99)
Potassium: 3.9 mmol/L (ref 3.5–5.1)
SODIUM: 137 mmol/L (ref 135–145)

## 2018-06-15 NOTE — Progress Notes (Signed)
Received call from central tele stating patient appeared to be in junctional rhythm. They questioned if he leads were on correctly based on last EKG showing sinus rhythm. Leads checked and they were in proper place, replaced stickers. Called back to central tele and she reports that patient is still in junctional rhythm. Previously he was junctional in the 50's now is accelerated junctional in 70's. Cardiology PA notified. He will be up to review.

## 2018-06-15 NOTE — Progress Notes (Signed)
    Reviewed telemetry in detail which shows no evidence of junctional rhythm. Patient has been in sinus rhythm to sinus bradycardia with heart rates ranging from the upper 50s to 70s bpm. No changes in medications. Patient ok for discharge.

## 2018-06-15 NOTE — Progress Notes (Signed)
Progress Note  Patient Name: Edward SpeckingDarin W Spieth Date of Encounter: 06/15/2018  Primary Cardiologist: Kirke CorinArida  Subjective   Feels well. No chest pain or SOB. Feels like he has more energy. Has ambulated without issues. Post cath labs and vitals stable.   Inpatient Medications    Scheduled Meds: . aspirin EC  81 mg Oral Daily  . atorvastatin  80 mg Oral q1800  . carvedilol  25 mg Oral BID WC  . lisinopril  20 mg Oral Daily  . sodium chloride flush  3 mL Intravenous Q12H  . ticagrelor  90 mg Oral BID   Continuous Infusions: . sodium chloride     PRN Meds: sodium chloride, acetaminophen, ondansetron (ZOFRAN) IV, sodium chloride flush   Vital Signs    Vitals:   06/14/18 1200 06/14/18 1240 06/14/18 2000 06/15/18 0340  BP: 105/86 106/79 (!) 147/105 133/90  Pulse: 67 63 78 73  Resp: 12 20 18    Temp:  (!) 97.5 F (36.4 C) 97.9 F (36.6 C) 98 F (36.7 C)  TempSrc:  Oral Oral Oral  SpO2: 97% 97% 98% 97%  Weight:  89 kg    Height:        Intake/Output Summary (Last 24 hours) at 06/15/2018 0709 Last data filed at 06/15/2018 0352 Gross per 24 hour  Intake 744.45 ml  Output 0 ml  Net 744.45 ml   Filed Weights   06/14/18 0645 06/14/18 1240  Weight: 88.5 kg 89 kg    Telemetry    NSR - Personally Reviewed  ECG    NSR, 72 bpm, prior inferior infarct, no acute st/t changes - Personally Reviewed  Physical Exam   GEN: No acute distress.   Neck: No JVD. Cardiac: RRR, no murmurs, rubs, or gallops. Right radial cardiac cath site is without active bleeding, bruising, swelling, erythema, warmth, or TTP. Radial pulse 2+.  Respiratory: Clear to auscultation bilaterally.  GI: Soft, nontender, non-distended.   MS: No edema; No deformity. Neuro:  Alert and oriented x 3; Nonfocal.  Psych: Normal affect.  Labs    Chemistry Recent Labs  Lab 06/15/18 0555  NA 137  K 3.9  CL 111  CO2 18*  GLUCOSE 103*  BUN 14  CREATININE 0.65  CALCIUM 9.3  GFRNONAA >60  GFRAA >60    ANIONGAP 8     Hematology Recent Labs  Lab 06/15/18 0555  WBC 8.7  RBC 4.68  HGB 14.4  HCT 43.8  MCV 93.6  MCH 30.8  MCHC 32.9  RDW 14.0  PLT 205    Cardiac EnzymesNo results for input(s): TROPONINI in the last 168 hours. No results for input(s): TROPIPOC in the last 168 hours.   BNPNo results for input(s): BNP, PROBNP in the last 168 hours.   DDimer No results for input(s): DDIMER in the last 168 hours.   Radiology    No results found.  Cardiac Studies   LHC 06/14/2018: Conclusion     Dist LM lesion is 50% stenosed.  Ost LAD lesion is 60% stenosed.  Prox LAD to Mid LAD lesion is 80% stenosed.  Prox Cx lesion is 100% stenosed.  Mid RCA-1 lesion is 40% stenosed.  Dist RCA lesion is 85% stenosed.  Prox RCA lesion is 70% stenosed.  Mid RCA-2 lesion is 99% stenosed.  Prox Graft to Mid Graft lesion is 100% stenosed.  LIMA and is normal in caliber.  The graft exhibits no disease.  SVG.  Origin lesion is 100% stenosed.  Post intervention,  there is a 0% residual stenosis.  A drug-eluting stent was successfully placed using a STENT RESOLUTE ONYX [id number].  A drug-eluting stent was successfully placed using a STENT RESOLUTE ONYX [id number].  Post intervention, there is a 0% residual stenosis.  Post intervention, there is a 0% residual stenosis.  A drug-eluting stent was successfully placed using a STENT RESOLUTE ONYX [id number].  Post intervention, there is a 0% residual stenosis.   Successful complex angioplasty and 3 drug-eluting stent placement to the right coronary artery.  Recommendations: Dual antiplatelet therapy indefinitely if possible .  Aggressive treatment of risk factors and cardiac rehab.     Patient Profile     47 y.o. male with history of CAD with a NSTEMI in 12/2017 s/p 4-vessel CABG (LIMA-LAD, sequential SVG-ramus and distal LCx, SVG-rPDA) on 01/10/2018, HFrEF secondary to ICM, severe HLD, HTN, tobacco and alcohol abuse, and  obesity who presented to [facility] for staged PCI.  Assessment & Plan    1. CAD s/p CABG with complex staged PCI to the native RCA: -Chest pain free -DAPT indefinitely, if possible -Aggressive risk factor modification and secondary prevention  -Post cath instructions  -Cardiac rehab  2. HFrEF secondary to ICM: -He does not appear grossly volume up  -In outpatient follow up consider adding spironolactone with possible transition to [facility] following 36 hour wash out of ACEi -Coreg, lisinopril  -CHF education   3. HTN: -Blood pressure is reasonably controlled -In outpatient follow up consider [facility] to [facility] as above  4. HLD: -Most recent LDL of 54 with TG of 245 from 02/2018 -LFT normal in 04/2018 -Lipitor 80 mg daily -In outpatient follow up, consider Vescepa  5. Obesity: -Weight loss advised -Cardiac rehab  6. Alcohol/tobacco abuse: -Complete cessation is advised   For questions or updates, please contact [facility] Please consult [email or web address] for contact info under Cardiology/STEMI.    Signed, Eula Listenyan Dunn, PA-C [facility] Pager: [phone] 06/15/2018, 7:09 AM

## 2018-06-17 ENCOUNTER — Encounter: Payer: Self-pay | Admitting: Cardiovascular Disease

## 2018-07-05 ENCOUNTER — Ambulatory Visit: Payer: BLUE CROSS/BLUE SHIELD | Admitting: Cardiovascular Disease

## 2018-07-31 NOTE — Progress Notes (Signed)
Cardiology Office Note   Date:  08/02/2018   ID:  Edward Powell, DOB Jul 22, 1970, MRN [id number]  PCP:  [name], [name], MD  Cardiologist:   Lorine BearsMuhammad Arida, MD   Chief Complaint  Patient presents with  . Other    1 month follow up. Meds reviewed verbally with patient.       History of Present Illness: Edward SpeckingDarin W Dorsi is a 48 y.o. male who presents for follow-up visit regarding coronary artery disease status post  non-ST elevation myocardial infarction and CABG in July 2019.  He has known history of severe hyperlipidemia, tobacco use, excessive alcohol use and  hypertension.  He had non-ST elevation myocardial infarction in July, 2019 in the setting of severely elevated blood pressure. Cardiac catheterization showed severe three-vessel coronary artery disease with normal ejection fraction. Echocardiogram showed an EF of 55 to 60%, grade 1 diastolic dysfunction and no significant valvular abnormalities.  The patient underwent CABG on July 24 with LIMA to LAD, sequential SVG to ramus and distal left circumflex and SVG to right PDA. He quit smoking after his CABG and decreased alcohol intake.  He was able to lose weight after surgery.  He was noted to have recurrent angina during most recent office visit.  He underwent a  Lexiscan Myoview which was high risk with evidence of large inferior lateral and lateral ischemia and more than 1 mm of ST depression at low level exercise.   Due to that, he underwent cardiac catheterization which showed significant three-vessel coronary artery disease with patent LIMA to LAD and patent SVG to ramus.  There was an occluded jump graft from SVG to OM 3 and SVG to right PDA.  The left circumflex is chronically occluded with collaterals.  The RCA was diffusely diseased in multiple areas.  EF was 35 to 45% with moderately elevated left ventricular end-diastolic pressure.  The patient returned for staged PCI of the RCA which was performed with 3 drug-eluting stent  placement.  He is doing well since having his stents placed. He reports his energy has returned. Noting he will walk about [phone] steps a day. The patient is tolerating his medication, however, at first he adds taking plavix and brilinta for the first couple of days, without realizing it. He has since stopped plavix and continues on brilinta. He states depending on the day his blood pressure will vary. He denies SOB, chest pain, or any other related symptoms.    Past Medical History:  Diagnosis Date  . Alcohol abuse   . CAD (coronary artery disease)    a. 12/2017 NSTEMI/Cath: LM 50d, LAD 60ost, 4866m, RI nl, LCX 100p, OM2 fills via collats from LAD, RCA 70p, 7537m, 4184m, 60d, EF 55-65%; b. 12/2017 CABG x 4: LIMA->LAD, VG->RI->LCX, VG->RPDA.  . Diastolic dysfunction    a. 12/2017 Echo: EF 55-60%, no rwma, Gr1 DD, triv MR, nl RV fxn.  . Essential hypertension   . Hyperlipidemia LDL goal <70   . Hypertriglyceridemia   . Status post primary angioplasty with coronary stent 06/15/2018   Indefinite dual antiplatelet therapy per Dec 2019 note, Dr. Kirke CorinArida  . Tobacco abuse     Past Surgical History:  Procedure Laterality Date  . CORONARY ARTERY BYPASS GRAFT N/A 01/10/2018   Procedure: CORONARY ARTERY BYPASS GRAFTING (CABG);  Surgeon: Delight OvensGerhardt, Edward B, MD;  [facility];  Service: Open Heart Surgery;  Laterality: N/A;  Times 4 using left internal mammary artery to the LAD and endoscopically harvested left  saphenous vein to OM1, OM2, and PLB  . CORONARY STENT INTERVENTION N/A 06/14/2018   Procedure: CORONARY STENT INTERVENTION;  Surgeon: Iran OuchArida, Muhammad A, MD;  Location: [facility] INVASIVE CV LAB;  Service: Cardiovascular;  Laterality: N/A;  . LEFT HEART CATH AND CORONARY ANGIOGRAPHY N/A 01/09/2018   Procedure: LEFT HEART CATH AND CORONARY ANGIOGRAPHY;  Surgeon: Yvonne KendallEnd, Christopher, MD;  Location: [facility] INVASIVE CV LAB;  Service: Cardiovascular;  Laterality: N/A;  . LEFT HEART CATH AND CORONARY ANGIOGRAPHY N/A  05/31/2018   Procedure: LEFT HEART CATH AND CORONARY ANGIOGRAPHY;  Surgeon: Iran OuchArida, Muhammad A, MD;  Location: [facility] INVASIVE CV LAB;  Service: Cardiovascular;  Laterality: N/A;  . NO PAST SURGERIES    . TEE WITHOUT CARDIOVERSION N/A 01/10/2018   Procedure: TRANSESOPHAGEAL ECHOCARDIOGRAM (TEE);  Surgeon: Delight OvensGerhardt, Edward B, MD;  [facility];  Service: Open Heart Surgery;  Laterality: N/A;     Current Outpatient Medications  Medication Sig Dispense Refill  . acetaminophen (TYLENOL) 325 MG tablet Take 2 tablets (650 mg total) by mouth every 6 (six) hours as needed for mild pain (or Fever >/= 101).    Marland Kitchen. aspirin EC 81 MG EC tablet Take 1 tablet (81 mg total) by mouth daily.    Marland Kitchen. atorvastatin (LIPITOR) 80 MG tablet Take 1 tablet (80 mg total) by mouth daily at 6 PM. 90 tablet 3  . carvedilol (COREG) 25 MG tablet Take 1 tablet (25 mg total) by mouth 2 (two) times daily with a meal. 180 tablet 3  . lisinopril (PRINIVIL,ZESTRIL) 20 MG tablet Take 1 tablet (20 mg total) by mouth daily. 30 tablet 3  . ticagrelor (BRILINTA) 90 MG TABS tablet Take 1 tablet (90 mg total) by mouth 2 (two) times daily. 180 tablet 1   No current facility-administered medications for this visit.     Allergies:   Patient has no known allergies.    Social History:  The patient  reports that he has quit smoking. He has a 30.00 pack-year smoking history. He has never used smokeless tobacco. He reports previous alcohol use of about 9.0 standard drinks of alcohol per week. He reports previous drug use.   Family History:  The patient's family history includes Alzheimer's disease in his paternal grandmother; Atrial fibrillation in his father; Breast cancer in his mother; Heart attack in his maternal grandmother; Hyperlipidemia in his brother, maternal grandmother, and mother; Hypertension in his brother, father, maternal grandmother, mother, and paternal grandmother; Liver cancer in his father; Lung cancer in his father; Stroke in  his brother, maternal grandmother, and paternal grandmother.    ROS:  Please see the history of present illness.   Otherwise, review of systems are positive for none.   All other systems are reviewed and negative.    PHYSICAL EXAM: VS:  BP (!) 152/104 (BP Location: Left Arm, Patient Position: Sitting, Cuff Size: Normal)   Pulse 73   Ht 5\' 8"  (1.727 m)   Wt 201 lb (91.2 kg)   BMI 30.56 kg/m  , BMI Body mass index is 30.56 kg/m. GEN: Well nourished, well developed, in no acute distress  HEENT: normal  Neck: no JVD, carotid bruits, or masses Cardiac: RRR; no murmurs, rubs, or gallops,no edema  Respiratory:  clear to auscultation bilaterally, normal work of breathing GI: soft, nontender, nondistended, + BS MS: no deformity or atrophy  Skin: warm and dry, no rash Neuro:  Strength and sensation are intact Psych: euthymic mood, full affect Left radial pulses normal.   EKG:  EKG  is ordered today. The ekg ordered today demonstrates normal sinus rhythm with old inferior infarct.    Recent Labs: 01/15/2018: Magnesium 2.1 05/10/2018: ALT 46 06/15/2018: BUN 14; Creatinine, Ser 0.65; Hemoglobin 14.4; Platelets 205; Potassium 3.9; Sodium 137    Lipid Panel    Component Value Date/Time   CHOL 135 03/06/2018 0845   TRIG 245 (H) 03/06/2018 0845   HDL 32 (L) 03/06/2018 0845   CHOLHDL 4.2 03/06/2018 0845   CHOLHDL 5.5 01/09/2018 0510   VLDL 53 (H) 01/09/2018 0510   LDLCALC 54 03/06/2018 0845      Wt Readings from Last 3 Encounters:  08/02/18 201 lb (91.2 kg)  06/14/18 196 lb 4.8 oz (89 kg)  05/31/18 200 lb (90.7 kg)        No flowsheet data found.    ASSESSMENT AND PLAN:  1.  Coronary artery disease involving native coronary arteries without angina: No anginal symptoms since PCI of the right coronary artery and he seems to be doing very well.  Continue aggressive medical therapy.  Continue dual antiplatelet therapy for at least 1 year and possibly indefinitely given  multiple overlapped stents in the right coronary artery.   2.  Hyperlipidemia: His lipid profile improved significantly with high-dose atorvastatin with a drop in LDL to 55.  He continues to have elevated triglyceride and he might benefit from Vascepa down the road.   3.  Essential hypertension: Blood pressure has been elevated lately.  Increase lisinopril to 40 mg daily.  4.  Chronic Systolic Heart Failure: Echocardiogram in 1 month to evaluate ejection fraction.  If ejection fraction is below 40%, I am planning to switch him from lisinopril to [place].   Disposition:   FU with me in 3 month  I, Diona Browner am acting as a Neurosurgeon for Lorine Bears, M.D.  I have reviewed the above documentation for accuracy and completeness, and I agree with the above.   Signed, Lorine Bears, MD 08/02/18 [facility] [place], [place] [phone]

## 2018-08-02 ENCOUNTER — Ambulatory Visit (INDEPENDENT_AMBULATORY_CARE_PROVIDER_SITE_OTHER): Payer: BLUE CROSS/BLUE SHIELD | Admitting: Cardiovascular Disease

## 2018-08-02 ENCOUNTER — Encounter: Payer: Self-pay | Admitting: Cardiovascular Disease

## 2018-08-02 VITALS — BP 152/104 | HR 73 | Ht 68.0 in | Wt 201.0 lb

## 2018-08-02 DIAGNOSIS — E785 Hyperlipidemia, unspecified: Secondary | ICD-10-CM

## 2018-08-02 DIAGNOSIS — I10 Essential (primary) hypertension: Secondary | ICD-10-CM

## 2018-08-02 DIAGNOSIS — I25110 Atherosclerotic heart disease of native coronary artery with unstable angina pectoris: Secondary | ICD-10-CM | POA: Diagnosis not present

## 2018-08-02 DIAGNOSIS — I5022 Chronic systolic (congestive) heart failure: Secondary | ICD-10-CM

## 2018-08-02 DIAGNOSIS — I255 Ischemic cardiomyopathy: Secondary | ICD-10-CM

## 2018-08-02 MED ORDER — LISINOPRIL 40 MG PO TABS: 40.0000 mg | ORAL_TABLET | Freq: Every day | ORAL | 0 refills | Status: DC

## 2018-08-02 NOTE — Patient Instructions (Addendum)
Medication Instructions:  INCREASE the Lisinopril to 40 mg daily  If you need a refill on your cardiac medications before your next appointment, please call your pharmacy.   Lab work: None ordered  Testing/Procedures: Your physician has requested that you have an echocardiogram in one month. Echocardiography is a painless test that uses sound waves to create images of your heart. It provides your doctor with information about the size and shape of your heart and how well your heart's chambers and valves are working. You may receive an ultrasound enhancing agent through an IV if needed to better visualize your heart during the echo.This procedure takes approximately one hour. There are no restrictions for this procedure. This will take place at the [facility] clinic.    Follow-Up: At [facility], you and your health needs are our priority.  As part of our continuing mission to provide you with exceptional heart care, we have created designated Provider Care Teams.  These Care Teams include your primary Cardiologist (physician) and Advanced Practice Providers (APPs -  Physician Assistants and Nurse Practitioners) who all work together to provide you with the care you need, when you need it. You will need a follow up appointment in 3 months. You may see Lorine Bears, MD or one of the following Advanced Practice Providers on your designated Care Team:   Nicolasa Ducking, NP Eula Listen, PA-C . Marisue Ivan, PA-C

## 2018-08-21 ENCOUNTER — Ambulatory Visit
Admission: EM | Admit: 2018-08-21 | Discharge: 2018-08-21 | Disposition: A | Discharge status: Home / Self Care | Payer: BLUE CROSS/BLUE SHIELD | Attending: Family Medicine | Admitting: Family Medicine

## 2018-08-21 ENCOUNTER — Other Ambulatory Visit: Payer: Self-pay

## 2018-08-21 ENCOUNTER — Encounter: Payer: Self-pay | Admitting: Emergency Medicine

## 2018-08-21 DIAGNOSIS — S40861A Insect bite (nonvenomous) of right upper arm, initial encounter: Secondary | ICD-10-CM | POA: Diagnosis not present

## 2018-08-21 DIAGNOSIS — A059 Bacterial foodborne intoxication, unspecified: Secondary | ICD-10-CM

## 2018-08-21 DIAGNOSIS — W57XXXA Bitten or stung by nonvenomous insect and other nonvenomous arthropods, initial encounter: Secondary | ICD-10-CM | POA: Diagnosis not present

## 2018-08-21 MED ORDER — ONDANSETRON 8 MG PO TBDP: 8.0000 mg | ORAL_TABLET | Freq: Three times a day (TID) | ORAL | 0 refills | Status: DC | PRN

## 2018-08-21 NOTE — ED Provider Notes (Signed)
[facility]    CSN: [id number] Arrival date & time: 08/21/18  1030     History   Chief Complaint Chief Complaint  Patient presents with  . Insect Bite    right upper arm    HPI Edward Powell is a 48 y.o. male.   48 yo male with a c/o insect bite to his right upper inner arm that he noticed this morning. Denies any fevers, chills, drainage. States yesterday he woke up with vomiting and diarrhea after eating at [employer] the night before.    The history is provided by the patient.    Past Medical History:  Diagnosis Date  . Alcohol abuse   . CAD (coronary artery disease)    a. 12/2017 NSTEMI/Cath: LM 50d, LAD 60ost, 22m, RI nl, LCX 100p, OM2 fills via collats from LAD, RCA 70p, 58m, 79m, 60d, EF 55-65%; b. 12/2017 CABG x 4: LIMA->LAD, VG->RI->LCX, VG->RPDA.  . Diastolic dysfunction    a. 12/2017 Echo: EF 55-60%, no rwma, Gr1 DD, triv MR, nl RV fxn.  . Essential hypertension   . Hyperlipidemia LDL goal <70   . Hypertriglyceridemia   . Status post primary angioplasty with coronary stent 06/15/2018   Indefinite dual antiplatelet therapy per Dec 2019 note, Dr. Kirke Corin  . Tobacco abuse     Patient Active Problem List   Diagnosis Date Noted  . Status post primary angioplasty with coronary stent 06/15/2018  . Effort angina (HCC) 06/14/2018  . Ischemic cardiomyopathy 06/14/2018  . Hyperlipidemia LDL goal <70 06/14/2018  . Obesity (BMI 30.0-34.9) 05/10/2018  . Moderate concentric left ventricular hypertrophy 05/10/2018  . Essential hypertension 03/08/2018  . Coronary artery disease involving native heart with angina pectoris (HCC)   . 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 Unstable angina (HCC) 01/09/2018  . NSTEMI (non-ST elevated myocardial infarction) (HCC)   . Asthmatic bronchitis 01/08/2018    Past Surgical History:  Procedure Laterality Date  . CORONARY ARTERY BYPASS GRAFT N/A 01/10/2018   Procedure: CORONARY ARTERY BYPASS GRAFTING (CABG);  Surgeon: Delight Ovens,  MD;  Location: [facility] OR;  Service: Open Heart Surgery;  Laterality: N/A;  Times 4 using left internal mammary artery to the LAD and endoscopically harvested left saphenous vein to OM1, OM2, and PLB  . CORONARY STENT INTERVENTION N/A 06/14/2018   Procedure: CORONARY STENT INTERVENTION;  Surgeon: Iran Ouch, MD;  Location: [facility] INVASIVE CV LAB;  Service: Cardiovascular;  Laterality: N/A;  . LEFT HEART CATH AND CORONARY ANGIOGRAPHY N/A 01/09/2018   Procedure: LEFT HEART CATH AND CORONARY ANGIOGRAPHY;  Surgeon: Yvonne Kendall, MD;  Location: [facility] INVASIVE CV LAB;  Service: Cardiovascular;  Laterality: N/A;  . LEFT HEART CATH AND CORONARY ANGIOGRAPHY N/A 05/31/2018   Procedure: LEFT HEART CATH AND CORONARY ANGIOGRAPHY;  Surgeon: Iran Ouch, MD;  Location: [facility] INVASIVE CV LAB;  Service: Cardiovascular;  Laterality: N/A;  . NO PAST SURGERIES    . TEE WITHOUT CARDIOVERSION N/A 01/10/2018   Procedure: TRANSESOPHAGEAL ECHOCARDIOGRAM (TEE);  Surgeon: Delight Ovens, MD;  Location: [facility] OR;  Service: Open Heart Surgery;  Laterality: N/A;       Home Medications    Prior to Admission medications   Medication Sig Start Date End Date Taking? Authorizing Provider  aspirin EC 81 MG EC tablet Take 1 tablet (81 mg total) by mouth daily. 01/09/18  Yes Alford Highland, MD  atorvastatin (LIPITOR) 80 MG tablet Take 1 tablet (80 mg total) by mouth daily at 6 PM. 02/02/18  Yes Iran Ouch, MD  carvedilol (COREG) 25 MG tablet Take 1 tablet (25 mg total) by mouth 2 (two) times daily with a meal. 02/02/18  Yes Iran Ouch, MD  lisinopril (PRINIVIL,ZESTRIL) 40 MG tablet Take 1 tablet (40 mg total) by mouth daily. 08/02/18  Yes Iran Ouch, MD  ticagrelor (BRILINTA) 90 MG TABS tablet Take 1 tablet (90 mg total) by mouth 2 (two) times daily. 06/01/18  Yes Iran Ouch, MD  acetaminophen (TYLENOL) 325 MG tablet Take 2 tablets (650 mg total) by mouth every 6 (six) hours as needed for mild  pain (or Fever >/= 101). 01/09/18   Renae Gloss, Richard, MD  ondansetron (ZOFRAN ODT) 8 MG disintegrating tablet Take 1 tablet (8 mg total) by mouth every 8 (eight) hours as needed. 08/21/18   Payton Mccallum, MD    Family History Family History  Problem Relation Age of Onset  . Hypertension Mother   . Breast cancer Mother   . Hyperlipidemia Mother   . Liver cancer Father   . Lung cancer Father   . Hypertension Father   . Atrial fibrillation Father   . Stroke Brother   . Hypertension Brother   . Hyperlipidemia Brother   . Heart attack Maternal Grandmother   . Stroke Maternal Grandmother   . Hyperlipidemia Maternal Grandmother   . Hypertension Maternal Grandmother   . Stroke Paternal Grandmother   . Alzheimer's disease Paternal Grandmother   . Hypertension Paternal Grandmother     Social History Social History   Tobacco Use  . Smoking status: Former Smoker    Packs/day: 1.00    Years: 30.00    Pack years: 30.00  . Smokeless tobacco: Never Used  . Tobacco comment: last cigarette was day of the heart attack  Substance Use Topics  . Alcohol use: Not Currently    Alcohol/week: 9.0 standard drinks    Types: 4 Cans of beer, 5 Shots of liquor per week    Frequency: Never    Comment: up to 7-9 shots twice weekly - vodka  . Drug use: Not Currently     Allergies   Patient has no known allergies.   Review of Systems Review of Systems   Physical Exam Triage Vital Signs ED Triage Vitals  Enc Vitals Group     BP 08/21/18 1051 (!) 175/128     Pulse Rate 08/21/18 1051 62     Resp 08/21/18 1051 16     Temp 08/21/18 1051 97.8 F (36.6 C)     Temp Source 08/21/18 1051 Oral     SpO2 08/21/18 1051 100 %     Weight 08/21/18 1048 196 lb (88.9 kg)     Height 08/21/18 1048 5\' 8"  (1.727 m)     Head Circumference --      Peak Flow --      Pain Score 08/21/18 1048 6     Pain Loc --      Pain Edu? --      Excl. in GC? --    No data found.  Updated Vital Signs BP (!) 160/120  (BP Location: Right Arm)   Pulse 62   Temp 97.8 F (36.6 C) (Oral)   Resp 16   Ht 5\' 8"  (1.727 m)   Wt 88.9 kg   SpO2 100%   BMI 29.80 kg/m   Visual Acuity Right Eye Distance:   Left Eye Distance:   Bilateral Distance:    Right Eye Near:   Left Eye  Near:    Bilateral Near:     Physical Exam Vitals signs and nursing note reviewed.  Constitutional:      General: He is not in acute distress.    Appearance: He is not toxic-appearing or diaphoretic.  Abdominal:     General: Bowel sounds are normal. There is no distension.     Palpations: Abdomen is soft.     Tenderness: There is no abdominal tenderness. There is no right CVA tenderness, left CVA tenderness, guarding or rebound.     Hernia: No hernia is present.  Skin:    Comments: Pinpoint puncture wound noted on skin of right inner upper arm with mild surrounding ecchymosis; no warmth, tenderness, blanchable erythema or drainage  Neurological:     Mental Status: He is alert.      UC Treatments / Results  Labs (all labs ordered are listed, but only abnormal results are displayed) Labs Reviewed - No data to display  EKG None  Radiology No results found.  Procedures Procedures (including critical care time)  Medications Ordered in UC Medications - No data to display  Initial Impression / Assessment and Plan / UC Course  I have reviewed the triage vital signs and the nursing notes.  Pertinent labs & imaging results that were available during my care of the patient were reviewed by me and considered in my medical decision making (see chart for details).      Final Clinical Impressions(s) / UC Diagnoses   Final diagnoses:  Food poisoning  Insect bite of right upper arm, initial encounter    ED Prescriptions    Medication Sig Dispense Auth. Provider   ondansetron (ZOFRAN ODT) 8 MG disintegrating tablet Take 1 tablet (8 mg total) by mouth every 8 (eight) hours as needed. 6 tablet Payton Mccallum, MD       1.  diagnosis reviewed with patient 2. rx as per orders above; reviewed possible side effects, interactions, risks and benefits  3. Recommend supportive treatment with fluids, rest  4. Follow-up prn if symptoms worsen or don't improve Controlled Substance Prescriptions [place] Controlled Substance Registry consulted? Not Applicable   Payton Mccallum, MD 08/21/18 [phone]

## 2018-08-21 NOTE — ED Triage Notes (Signed)
Patient states that he woke up this morning and noticed a red bit mark on the right upper arm.  Patient not sure if his was bit by an insect or spider.  Patient c/o bodyaches and chills.  Patient denies vomiting today.

## 2018-09-03 ENCOUNTER — Other Ambulatory Visit: Payer: Self-pay

## 2018-09-03 ENCOUNTER — Ambulatory Visit (INDEPENDENT_AMBULATORY_CARE_PROVIDER_SITE_OTHER): Payer: BLUE CROSS/BLUE SHIELD

## 2018-09-03 DIAGNOSIS — I5022 Chronic systolic (congestive) heart failure: Secondary | ICD-10-CM

## 2018-11-02 ENCOUNTER — Ambulatory Visit: Payer: BLUE CROSS/BLUE SHIELD | Admitting: Cardiovascular Disease

## 2018-12-21 ENCOUNTER — Other Ambulatory Visit: Payer: Self-pay

## 2018-12-25 MED ORDER — LISINOPRIL 40 MG PO TABS: 40.0000 mg | ORAL_TABLET | Freq: Every day | ORAL | 0 refills | Status: DC

## 2019-01-01 ENCOUNTER — Other Ambulatory Visit: Payer: Self-pay

## 2019-01-01 ENCOUNTER — Telehealth: Payer: Self-pay | Admitting: Cardiovascular Disease

## 2019-01-01 ENCOUNTER — Other Ambulatory Visit: Payer: Self-pay | Admitting: *Deleted

## 2019-01-01 MED ORDER — TICAGRELOR 90 MG PO TABS: 90.0000 mg | ORAL_TABLET | Freq: Two times a day (BID) | ORAL | 0 refills | Status: DC

## 2019-01-01 NOTE — Telephone Encounter (Signed)
Requested Prescriptions   Signed Prescriptions Disp Refills  . ticagrelor (BRILINTA) 90 MG TABS tablet 180 tablet 0    Sig: Take 1 tablet (90 mg total) by mouth 2 (two) times daily.    Authorizing Provider: Kathlyn Sacramento A    Ordering User: Britt Bottom

## 2019-01-01 NOTE — Telephone Encounter (Signed)
Requested Prescriptions   Signed Prescriptions Disp Refills  . ticagrelor (BRILINTA) 90 MG TABS tablet 180 tablet 0    Sig: Take 1 tablet (90 mg total) by mouth 2 (two) times daily.    Authorizing Provider: ARIDA, MUHAMMAD A    Ordering User: Boneta Standre C    

## 2019-01-01 NOTE — Telephone Encounter (Signed)
Pending

## 2019-01-01 NOTE — Telephone Encounter (Signed)
°*  STAT* If patient is at the pharmacy, call can be transferred to refill team.   1. Which medications need to be refilled? (please list name of each medication and dose if known)  Brilinta 90 MG - 1 tablet 2 times daily   2. Which pharmacy/location (including street and city if local pharmacy) is medication to be sent to? [facility] in [place]   3. Do they need a 30 day or 90 day supply? 90 day

## 2019-01-05 NOTE — Progress Notes (Deleted)
{Choose 1 Note Type (Telehealth Visit or Telephone Visit):[id number]}   Date:  01/05/2019   ID:  Edward Powell, DOB [birth year], MRN [id number]  {Patient Location:[phone]::"Home"} Provider Location: Office  PCP:  [name], [name], MD  Cardiologist:  Lorine BearsMuhammad Arida, MD  Electrophysiologist:  None   Evaluation Performed:  Follow-Up Visit  Chief Complaint:  Follow up  History of Present Illness:    Edward Powell is a 48 y.o. male with CAD status post CABG in 12/2017 followed by subsequent PCI as outlined below, severe hyperlipidemia, hypertension, tobacco use, and excessive alcohol use who presents for follow-up of his CAD.  Patient was admitted to the hospital in 12/2017 with a non-STEMI in the setting of severely elevated blood pressure.  Cardiac cath showed severe three-vessel CAD with a normal EF.  Echo showed an EF of 55 to 60%, grade 1 diastolic dysfunction, no significant valvular abnormalities.  He underwent successful four-vessel CABG on 01/10/2018 with LIMA to LAD, sequential SVG to ramus and distal LCx, and SVG to rPDA.  Following his surgery he quit smoking and decreased alcohol intake.  Patient was noted to have recurrent angina in 05/2018 and underwent a Lexiscan Myoview which was high risk with evidence of large inferior lateral and lateral ischemia with more than 1 mm ST depression at low-level exercise.  In this setting, he underwent diagnostic cardiac cath which showed significant three-vessel CAD with patent LIMA to LAD and patent SVG to ramus.  There was an occluded jump graft from SVG to OM 3 and SVG to rPDA.  The LCx was chronically occluded with collaterals.  The RCA was diffusely diseased in multiple areas.  EF was 35 to 45% with moderately elevated LVEDP.  He returned for staged PCI of the RCA which was performed with 3 drug-eluting stents on 06/14/2018.  He was most recently seen in the office on 08/02/2018 and was doing well since undergoing PCI noting improvement in energy.   It was noted for the first few days following PCI he was taking both Plavix and Brilinta though subsequently discontinued Plavix and remained on Brilinta.  Due to mild elevated blood pressure his lisinopril was increased to 40 mg daily.  Follow-up echo in 08/2018 showed an EF of 55 to 60%, normal LV cavity size, normal LV diastolic function, normal RV systolic function, mildly elevated RVSP of 41.6 mmHg, no significant valvular abnormalities.  ***  Labs: 05/2018 - Hgb 14.4, PLT 205, potassium 3.9, serum creatinine 0.65 04/2018 - Albumin 4.6, AST/LT normal 02/2018 - Total cholesterol 135, triglyceride 245, HDL 32, LDL 54  The patient {does/does not:200015} have symptoms concerning for COVID-19 infection (fever, chills, cough, or new shortness of breath).    Past Medical History:  Diagnosis Date  . Alcohol abuse   . CAD (coronary artery disease)    a. 12/2017 NSTEMI/Cath: LM 50d, LAD 60ost, 5791m, RI nl, LCX 100p, OM2 fills via collats from LAD, RCA 70p, 115m, 6043m, 60d, EF 55-65%; b. 12/2017 CABG x 4: LIMA->LAD, VG->RI->LCX, VG->RPDA.  . Diastolic dysfunction    a. 12/2017 Echo: EF 55-60%, no rwma, Gr1 DD, triv MR, nl RV fxn.  . Essential hypertension   . Hyperlipidemia LDL goal <70   . Hypertriglyceridemia   . Status post primary angioplasty with coronary stent 06/15/2018   Indefinite dual antiplatelet therapy per Dec 2019 note, Dr. Kirke CorinArida  . Tobacco abuse    Past Surgical History:  Procedure Laterality Date  . CORONARY ARTERY BYPASS GRAFT N/A 01/10/2018  Procedure: CORONARY ARTERY BYPASS GRAFTING (CABG);  Surgeon: Delight OvensGerhardt, Edward B, MD;  [facility];  Service: Open Heart Surgery;  Laterality: N/A;  Times 4 using left internal mammary artery to the LAD and endoscopically harvested left saphenous vein to OM1, OM2, and PLB  . CORONARY STENT INTERVENTION N/A 06/14/2018   Procedure: CORONARY STENT INTERVENTION;  Surgeon: Iran OuchArida, Muhammad A, MD;  Location: [facility] INVASIVE CV LAB;  Service:  Cardiovascular;  Laterality: N/A;  . LEFT HEART CATH AND CORONARY ANGIOGRAPHY N/A 01/09/2018   Procedure: LEFT HEART CATH AND CORONARY ANGIOGRAPHY;  Surgeon: Yvonne KendallEnd, Christopher, MD;  Location: [facility] INVASIVE CV LAB;  Service: Cardiovascular;  Laterality: N/A;  . LEFT HEART CATH AND CORONARY ANGIOGRAPHY N/A 05/31/2018   Procedure: LEFT HEART CATH AND CORONARY ANGIOGRAPHY;  Surgeon: Iran OuchArida, Muhammad A, MD;  Location: [facility] INVASIVE CV LAB;  Service: Cardiovascular;  Laterality: N/A;  . NO PAST SURGERIES    . TEE WITHOUT CARDIOVERSION N/A 01/10/2018   Procedure: TRANSESOPHAGEAL ECHOCARDIOGRAM (TEE);  Surgeon: Delight OvensGerhardt, Edward B, MD;  [facility];  Service: Open Heart Surgery;  Laterality: N/A;     No outpatient medications have been marked as taking for the 01/10/19 encounter (Appointment) with Sondra Bargesunn,  M, PA-C.     Allergies:   Patient has no known allergies.   Social History   Tobacco Use  . Smoking status: Former Smoker    Packs/day: 1.00    Years: 30.00    Pack years: 30.00  . Smokeless tobacco: Never Used  . Tobacco comment: last cigarette was day of the heart attack  Substance Use Topics  . Alcohol use: Not Currently    Alcohol/week: 9.0 standard drinks    Types: 4 Cans of beer, 5 Shots of liquor per week    Frequency: Never    Comment: up to 7-9 shots twice weekly - vodka  . Drug use: Not Currently     Family Hx: The patient's family history includes Alzheimer's disease in his paternal grandmother; Atrial fibrillation in his father; Breast cancer in his mother; Heart attack in his maternal grandmother; Hyperlipidemia in his brother, maternal grandmother, and mother; Hypertension in his brother, father, maternal grandmother, mother, and paternal grandmother; Liver cancer in his father; Lung cancer in his father; Stroke in his brother, maternal grandmother, and paternal grandmother.  ROS:   Please see the history of present illness.     All other systems reviewed and are  negative.   Prior CV studies:   The following studies were reviewed today:  2D Echo 08/2018: 1. The left ventricle has normal systolic function, with an ejection fraction of 55-60%. The cavity size was normal. Left ventricular diastolic parameters were normal.  2. The right ventricle has normal systolic function. The cavity was normal. There is no increase in right ventricular wall thickness. Right ventricular systolic pressure is mildly elevated with an estimated pressure of 41.6 mmHg.  3. The mitral valve is grossly normal.  4. The tricuspid valve is grossly normal.  5. The aortic valve was not well visualized Aortic valve regurgitation was not assessed by color flow Doppler. __________  LHC 05/31/2018:  Dist LM lesion is 50% stenosed.  Ost LAD lesion is 60% stenosed.  Prox LAD to Mid LAD lesion is 80% stenosed.  Prox Cx lesion is 100% stenosed.  Mid RCA-1 lesion is 40% stenosed.  Mid RCA-2 lesion is 95% stenosed.  Dist RCA lesion is 60% stenosed.  Prox RCA lesion is 70% stenosed.  Prox Graft to Mid  Graft lesion is 100% stenosed.  LIMA graft was visualized by non-selective angiography and is normal in caliber.  The graft exhibits no disease.  SVG.  Origin lesion is 100% stenosed.  LV end diastolic pressure is moderately elevated.  The left ventricular ejection fraction is 35-45% by visual estimate.  There is moderate left ventricular systolic dysfunction.   1.  Significant underlying three-vessel coronary artery disease with patent LIMA to LAD and patent SVG to ramus.  Occluded jump graft from SVG to OM 3 and SVG to right PDA.  The left circumflex is chronically occluded with collaterals.  The RCA is diffusely diseased in multiple areas. 2.  Moderately reduced LV systolic function moderately elevated left ventricular end-diastolic pressure.  Recommendations: The best option might be RCA PCI but that will require multiple stents in order to reconstruct probably  the whole artery.  This is best to be staged.  The patient might need a small dose diuretic. We will consider switching lisinopril to Entresto. __________  PCI 06/14/2018:  Dist LM lesion is 50% stenosed.  Ost LAD lesion is 60% stenosed.  Prox LAD to Mid LAD lesion is 80% stenosed.  Prox Cx lesion is 100% stenosed.  Mid RCA-1 lesion is 40% stenosed.  Dist RCA lesion is 85% stenosed.  Prox RCA lesion is 70% stenosed.  Mid RCA-2 lesion is 99% stenosed.  Prox Graft to Mid Graft lesion is 100% stenosed.  LIMA and is normal in caliber.  The graft exhibits no disease.  SVG.  Origin lesion is 100% stenosed.  Post intervention, there is a 0% residual stenosis.  A drug-eluting stent was successfully placed using a STENT RESOLUTE ONYX [id number].  A drug-eluting stent was successfully placed using a STENT RESOLUTE ONYX [id number].  Post intervention, there is a 0% residual stenosis.  Post intervention, there is a 0% residual stenosis.  A drug-eluting stent was successfully placed using a STENT RESOLUTE ONYX [id number].  Post intervention, there is a 0% residual stenosis.   Successful complex angioplasty and 3 drug-eluting stent placement to the right coronary artery.  Recommendations: Dual antiplatelet therapy indefinitely if possible .  Aggressive treatment of risk factors and cardiac rehab.  Labs/Other Tests and Data Reviewed:    EKG:  {EKG/Telemetry Strips [phone]}  Recent Labs: 01/15/2018: Magnesium 2.1 05/10/2018: ALT 46 06/15/2018: BUN 14; Creatinine, Ser 0.65; Hemoglobin 14.4; Platelets 205; Potassium 3.9; Sodium 137   Recent Lipid Panel Lab Results  Component Value Date/Time   CHOL 135 03/06/2018 08:45 AM   TRIG 245 (H) 03/06/2018 08:45 AM   HDL 32 (L) 03/06/2018 08:45 AM   CHOLHDL 4.2 03/06/2018 08:45 AM   CHOLHDL 5.5 01/09/2018 05:10 AM   LDLCALC 54 03/06/2018 08:45 AM    Wt Readings from Last 3 Encounters:  08/21/18 196 lb (88.9 kg)   08/02/18 201 lb (91.2 kg)  06/14/18 196 lb 4.8 oz (89 kg)     Objective:    Vital Signs:  There were no vitals taken for this visit.   {[facility] Virtual Exam (Optional):[phone]::"VITAL SIGNS:  reviewed"}  ASSESSMENT & PLAN:    1. ***  COVID-19 Education: The signs and symptoms of COVID-19 were discussed with the patient and how to seek care for testing (follow up with PCP or arrange E-visit).  The importance of social distancing was discussed today.  Time:   Today, I have spent *** minutes with the patient with telehealth technology discussing the above problems.     Medication Adjustments/Labs and Tests Ordered: Current medicines  are reviewed at length with the patient today.  Concerns regarding medicines are outlined above.   Tests Ordered: No orders of the defined types were placed in this encounter.   Medication Changes: No orders of the defined types were placed in this encounter.   Follow Up:  {F/U Format:[phone]} {follow up:15908}  Signed, Christell Faith, PA-C  01/05/2019 9:19 AM    [facility]

## 2019-01-10 ENCOUNTER — Telehealth: Payer: BC Managed Care – PPO | Admitting: Physician Assistant

## 2019-01-17 NOTE — Progress Notes (Signed)
Virtual Visit via Video Note   This visit type was conducted due to national recommendations for restrictions regarding the COVID-19 Pandemic (e.g. social distancing) in an effort to limit this patient's exposure and mitigate transmission in our community.  Due to his co-morbid illnesses, this patient is at least at moderate risk for complications without adequate follow up.  This format is felt to be most appropriate for this patient at this time.  All issues noted in this document were discussed and addressed.  A limited physical exam was performed with this format.  Please refer to the patient's chart for his consent to telehealth for [facility].   Date:  01/18/2019   ID:  Edward Powell, DOB 01-14-1971, MRN [id number]  Patient Location: Home Provider Location: Office  PCP:  Kerman Passey, MD  Cardiologist:  Lorine Bears, MD  Electrophysiologist:  None   Evaluation Performed:  Follow-Up Visit  Chief Complaint:  Follow up  History of Present Illness:    Edward Powell is a 48 y.o. male with history of CAD status post CABG in 12/2017 followed by subsequent PCI as outlined below, severe hyperlipidemia, hypertension, tobacco use, and excessive alcohol use who presents for follow-up of his CAD.  Patient was admitted to the hospital in 12/2017 with a non-STEMI in the setting of severely elevated blood pressure.  Cardiac cath showed severe three-vessel CAD with a normal EF.  Echo showed an EF of 55 to 60%, grade 1 diastolic dysfunction, no significant valvular abnormalities.  He underwent successful four-vessel CABG on 01/10/2018 with LIMA to LAD, sequential SVG to ramus and distal LCx, and SVG to rPDA.  Following his surgery he quit smoking and decreased alcohol intake.  Patient was noted to have recurrent angina in 05/2018 and underwent a Lexiscan Myoview which was high risk with evidence of large inferior lateral and lateral ischemia with more than 1 mm ST depression at low-level  exercise.  In this setting, he underwent diagnostic cardiac cath which showed significant three-vessel CAD with patent LIMA to LAD and patent SVG to ramus.  There was an occluded jump graft from SVG to OM 3 and SVG to rPDA.  The LCx was chronically occluded with collaterals.  The RCA was diffusely diseased in multiple areas.  EF was 35 to 45% with moderately elevated LVEDP.  He returned for staged PCI of the RCA which was performed with 3 drug-eluting stents on 06/14/2018.  He was most recently seen in the office on 08/02/2018 and was doing well since undergoing PCI noting improvement in energy.  It was noted for the first few days following PCI he was taking both Plavix and Brilinta though subsequently discontinued Plavix and remained on Brilinta.  Due to mild elevated blood pressure his lisinopril was increased to 40 mg daily.  Follow-up echo in 08/2018 showed an EF of 55 to 60%, normal LV cavity size, normal LV diastolic function, normal RV systolic function, mildly elevated RVSP of 41.6 mmHg, no significant valvular abnormalities.  Patient is seen in virtual follow-up today and is doing quite well from a cardiac perspective.  He has not had any chest pain, shortness of breath, palpitations, dizziness, presyncope, or syncope since he was last seen.  No lower extremity swelling, abdominal distention, orthopnea, PND, early satiety.  No falls, BRBPR, or melena.  He has noted occasional spontaneous episodes of epistaxis.  He indicates he did not have these while on Plavix.  No prior ENT evaluation.  No recent episode of  epistaxis.  He has not required ED evaluation for these bleeds.  Also, he notes his insurance has changed and Brilinta will now cost him more than $100 for 30-day supply with his prior insurance he was able to get a 90-day supply of Brilinta for $15.  He request to go back on Plavix.  Otherwise, he does not have any issues or concerns to discuss today.  Labs: 05/2018 - Hgb 14.4, PLT 205,  potassium 3.9, serum creatinine 0.65 04/2018 - Albumin 4.6, AST/LT normal 02/2018 - Total cholesterol 135, triglyceride 245, HDL 32, LDL 54  The patient does not have symptoms concerning for COVID-19 infection (fever, chills, cough, or new shortness of breath).    Past Medical History:  Diagnosis Date   Alcohol abuse    CAD (coronary artery disease)    a. 12/2017 NSTEMI/Cath: LM 50d, LAD 60ost, 5177m, RI nl, LCX 100p, OM2 fills via collats from LAD, RCA 70p, 6635m, 2766m, 60d, EF 55-65%; b. 12/2017 CABG x 4: LIMA->LAD, VG->RI->LCX, VG->RPDA.   Diastolic dysfunction    a. 12/2017 Echo: EF 55-60%, no rwma, Gr1 DD, triv MR, nl RV fxn.   Essential hypertension    Hyperlipidemia LDL goal <70    Hypertriglyceridemia    Status post primary angioplasty with coronary stent 06/15/2018   Indefinite dual antiplatelet therapy per Dec 2019 note, Dr. Kirke CorinArida   Tobacco abuse    Past Surgical History:  Procedure Laterality Date   CORONARY ARTERY BYPASS GRAFT N/A 01/10/2018   Procedure: CORONARY ARTERY BYPASS GRAFTING (CABG);  Surgeon: Delight OvensGerhardt, Edward B, MD;  [facility];  Service: Open Heart Surgery;  Laterality: N/A;  Times 4 using left internal mammary artery to the LAD and endoscopically harvested left saphenous vein to OM1, OM2, and PLB   CORONARY STENT INTERVENTION N/A 06/14/2018   Procedure: CORONARY STENT INTERVENTION;  Surgeon: Iran OuchArida, Muhammad A, MD;  Location: [facility] INVASIVE CV LAB;  Service: Cardiovascular;  Laterality: N/A;   LEFT HEART CATH AND CORONARY ANGIOGRAPHY N/A 01/09/2018   Procedure: LEFT HEART CATH AND CORONARY ANGIOGRAPHY;  Surgeon: Yvonne KendallEnd, Christopher, MD;  Location: [facility] INVASIVE CV LAB;  Service: Cardiovascular;  Laterality: N/A;   LEFT HEART CATH AND CORONARY ANGIOGRAPHY N/A 05/31/2018   Procedure: LEFT HEART CATH AND CORONARY ANGIOGRAPHY;  Surgeon: Iran OuchArida, Muhammad A, MD;  Location: [facility] INVASIVE CV LAB;  Service: Cardiovascular;  Laterality: N/A;   NO PAST SURGERIES      TEE WITHOUT CARDIOVERSION N/A 01/10/2018   Procedure: TRANSESOPHAGEAL ECHOCARDIOGRAM (TEE);  Surgeon: Delight OvensGerhardt, Edward B, MD;  [facility];  Service: Open Heart Surgery;  Laterality: N/A;     Current Meds  Medication Sig   acetaminophen (TYLENOL) 325 MG tablet Take 2 tablets (650 mg total) by mouth every 6 (six) hours as needed for mild pain (or Fever >/= 101).   aspirin EC 81 MG EC tablet Take 1 tablet (81 mg total) by mouth daily.   atorvastatin (LIPITOR) 80 MG tablet Take 1 tablet (80 mg total) by mouth daily at 6 PM.   carvedilol (COREG) 25 MG tablet Take 1 tablet (25 mg total) by mouth 2 (two) times daily with a meal.   lisinopril (ZESTRIL) 40 MG tablet Take 1 tablet (40 mg total) by mouth daily.   ticagrelor (BRILINTA) 90 MG TABS tablet Take 1 tablet (90 mg total) by mouth 2 (two) times daily.     Allergies:   Patient has no known allergies.   Social History   Tobacco Use   Smoking  status: Former Smoker    Packs/day: 1.00    Years: 30.00    Pack years: 30.00   Smokeless tobacco: Never Used   Tobacco comment: last cigarette was day of the heart attack  Substance Use Topics   Alcohol use: Not Currently    Alcohol/week: 9.0 standard drinks    Types: 4 Cans of beer, 5 Shots of liquor per week    Frequency: Never    Comment: up to 7-9 shots twice weekly - vodka   Drug use: Not Currently     Family Hx: The patient's family history includes Alzheimer's disease in his paternal grandmother; Atrial fibrillation in his father; Breast cancer in his mother; Heart attack in his maternal grandmother; Hyperlipidemia in his brother, maternal grandmother, and mother; Hypertension in his brother, father, maternal grandmother, mother, and paternal grandmother; Liver cancer in his father; Lung cancer in his father; Stroke in his brother, maternal grandmother, and paternal grandmother.  ROS:   Please see the history of present illness.     All other systems reviewed and are  negative.   Prior CV studies:   The following studies were reviewed today:  2D Echo 08/2018: 1. The left ventricle has normal systolic function, with an ejection fraction of 55-60%. The cavity size was normal. Left ventricular diastolic parameters were normal. 2. The right ventricle has normal systolic function. The cavity was normal. There is no increase in right ventricular wall thickness. Right ventricular systolic pressure is mildly elevated with an estimated pressure of 41.6 mmHg. 3. The mitral valve is grossly normal. 4. The tricuspid valve is grossly normal. 5. The aortic valve was not well visualized Aortic valve regurgitation was not assessed by color flow Doppler. __________  LHC 05/31/2018:  Dist LM lesion is 50% stenosed.  Ost LAD lesion is 60% stenosed.  Prox LAD to Mid LAD lesion is 80% stenosed.  Prox Cx lesion is 100% stenosed.  Mid RCA-1 lesion is 40% stenosed.  Mid RCA-2 lesion is 95% stenosed.  Dist RCA lesion is 60% stenosed.  Prox RCA lesion is 70% stenosed.  Prox Graft to Mid Graft lesion is 100% stenosed.  LIMA graft was visualized by non-selective angiography and is normal in caliber.  The graft exhibits no disease.  SVG.  Origin lesion is 100% stenosed.  LV end diastolic pressure is moderately elevated.  The left ventricular ejection fraction is 35-45% by visual estimate.  There is moderate left ventricular systolic dysfunction.  1. Significant underlying three-vessel coronary artery disease with patent LIMA to LAD and patent SVG to ramus. Occluded jump graft from SVG to OM 3 and SVG to right PDA. The left circumflex is chronically occluded with collaterals. The RCA is diffusely diseased in multiple areas. 2. Moderately reduced LV systolic function moderately elevated left ventricular end-diastolic pressure.  Recommendations: The best option might be RCA PCI but that will require multiple stents in order to reconstruct probably  the whole artery. This is best to be staged. The patient might need a small dose diuretic. We will consider switching lisinopril to Entresto. __________  PCI 06/14/2018:  Dist LM lesion is 50% stenosed.  Ost LAD lesion is 60% stenosed.  Prox LAD to Mid LAD lesion is 80% stenosed.  Prox Cx lesion is 100% stenosed.  Mid RCA-1 lesion is 40% stenosed.  Dist RCA lesion is 85% stenosed.  Prox RCA lesion is 70% stenosed.  Mid RCA-2 lesion is 99% stenosed.  Prox Graft to Mid Graft lesion is 100% stenosed.  LIMA  and is normal in caliber.  The graft exhibits no disease.  SVG.  Origin lesion is 100% stenosed.  Post intervention, there is a 0% residual stenosis.  A drug-eluting stent was successfully placed using a STENT RESOLUTE ONYX [id number].  A drug-eluting stent was successfully placed using a STENT RESOLUTE ONYX [id number].  Post intervention, there is a 0% residual stenosis.  Post intervention, there is a 0% residual stenosis.  A drug-eluting stent was successfully placed using a STENT RESOLUTE ONYX [id number].  Post intervention, there is a 0% residual stenosis.  Successful complex angioplasty and 3 drug-eluting stent placement to the right coronary artery.  Recommendations: Dual antiplatelet therapy indefinitely if possible .  Aggressive treatment of risk factors and cardiac rehab  Labs/Other Tests and Data Reviewed:    EKG:  No ECG reviewed.  Recent Labs: 05/10/2018: ALT 46 06/15/2018: BUN 14; Creatinine, Ser 0.65; Hemoglobin 14.4; Platelets 205; Potassium 3.9; Sodium 137   Recent Lipid Panel Lab Results  Component Value Date/Time   CHOL 135 03/06/2018 08:45 AM   TRIG 245 (H) 03/06/2018 08:45 AM   HDL 32 (L) 03/06/2018 08:45 AM   CHOLHDL 4.2 03/06/2018 08:45 AM   CHOLHDL 5.5 01/09/2018 05:10 AM   LDLCALC 54 03/06/2018 08:45 AM    Wt Readings from Last 3 Encounters:  01/18/19 202 lb (91.6 kg)  08/21/18 196 lb (88.9 kg)  08/02/18 201 lb (91.2 kg)       Objective:    Vital Signs:  Ht 5\' 8"  (1.727 m)    Wt 202 lb (91.6 kg)    BMI 30.71 kg/m    VITAL SIGNS:  reviewed  ASSESSMENT & PLAN:    1. CAD involving the native coronary arteries status post CABG status post subsequent PCI without angina: He is doing well without any symptoms concerning for angina.  He continues to note good return of energy following PCI to the RCA in 05/2018.  He has noted some spontaneous epistaxis while on Brilinta and aspirin.  He also reports Brilinta will not cost him greater than $100 for 30-day supply given his new insurance.  He would like to go back on Plavix as he tolerated this well.  I advised him I will reach out to his primary cardiologist regarding this request and get back with him first of next week.  For now, the patient is to remain on aspirin and Brilinta without interruption until he hears back from me.  Order placed to check CBC.  He will need to remain on dual antiplatelet therapy for at least 1 year dating back to 05/2018 and possibly indefinitely given multiple overlapping stents in the RCA.  No plan for further ischemic evaluation at this time.  2. HFrEF secondary to ICM: Most recent echo showed normalization of EF in 08/2018 as outlined above.  He appears euvolemic and well compensated in video conferencing today.  Continue current dose of Coreg and lisinopril.  3. Hyperlipidemia/hypertriglyceridemia: LDL of 54 from 02/2018 with a triglyceride of 254 at that time.  He remains compliant with Lipitor.  I did place an order for him to have a fasting lipid panel and liver function at his convenience to trend his LDL and triglycerides.  If his triglycerides remain elevated at that time would consider addition of Vascepa.  4. Hypertension: No recent blood pressures for review.  In this setting, we will continue current doses of carvedilol and lisinopril.   COVID-19 Education: The signs and symptoms of COVID-19 were discussed with the patient  and how to  seek care for testing (follow up with PCP or arrange E-visit).  The importance of social distancing was discussed today.  Time:   Today, I have spent 12 minutes with the patient with telehealth technology discussing the above problems.     Medication Adjustments/Labs and Tests Ordered: Current medicines are reviewed at length with the patient today.  Concerns regarding medicines are outlined above.   Tests Ordered: No orders of the defined types were placed in this encounter.   Medication Changes: No orders of the defined types were placed in this encounter.   Follow Up:  In Person in 6 month(s)  Signed, Christell Faith, PA-C  01/18/2019 11:45 AM    [place]

## 2019-01-18 ENCOUNTER — Other Ambulatory Visit: Payer: Self-pay

## 2019-01-18 ENCOUNTER — Telehealth: Payer: Self-pay | Admitting: Nurse Practitioner

## 2019-01-18 ENCOUNTER — Telehealth (INDEPENDENT_AMBULATORY_CARE_PROVIDER_SITE_OTHER): Payer: BC Managed Care – PPO | Admitting: Physician Assistant

## 2019-01-18 ENCOUNTER — Encounter: Payer: Self-pay | Admitting: Physician Assistant

## 2019-01-18 VITALS — Ht 68.0 in | Wt 202.0 lb

## 2019-01-18 DIAGNOSIS — I10 Essential (primary) hypertension: Secondary | ICD-10-CM

## 2019-01-18 DIAGNOSIS — I2510 Atherosclerotic heart disease of native coronary artery without angina pectoris: Secondary | ICD-10-CM | POA: Diagnosis not present

## 2019-01-18 DIAGNOSIS — I255 Ischemic cardiomyopathy: Secondary | ICD-10-CM

## 2019-01-18 DIAGNOSIS — I5022 Chronic systolic (congestive) heart failure: Secondary | ICD-10-CM

## 2019-01-18 DIAGNOSIS — E785 Hyperlipidemia, unspecified: Secondary | ICD-10-CM

## 2019-01-18 DIAGNOSIS — I5020 Unspecified systolic (congestive) heart failure: Secondary | ICD-10-CM

## 2019-01-18 DIAGNOSIS — I110 Hypertensive heart disease with heart failure: Secondary | ICD-10-CM

## 2019-01-18 DIAGNOSIS — E781 Pure hyperglyceridemia: Secondary | ICD-10-CM

## 2019-01-18 DIAGNOSIS — Z951 Presence of aortocoronary bypass graft: Secondary | ICD-10-CM

## 2019-01-18 NOTE — Patient Instructions (Addendum)
Medication Instructions:  - Your physician recommends that you continue on your current medications as directed. Please refer to the Current Medication list given to you today.  If you need a refill on your cardiac medications before your next appointment, please call your pharmacy.   Lab work: - Your physician recommends that you return for FASTING lab work: CMET/ Lipid/ CBC  - Please go to the [facility] at [facility] at your convenience to have this done. - Walk in appointments are Monday-Friday from 7:30 am- 5:30 pm - Once you enter, proceed to the 1st desk on the right (Registration) to check in  If you have labs (blood work) drawn today and your tests are completely normal, you will receive your results only by: Marland Kitchen MyChart Message (if you have MyChart) OR . A paper copy in the mail If you have any lab test that is abnormal or we need to change your treatment, we will call you to review the results.  Testing/Procedures: - none ordered  Follow-Up: At [facility], you and your health needs are our priority.  As part of our continuing mission to provide you with exceptional heart care, we have created designated Provider Care Teams.  These Care Teams include your primary Cardiologist (physician) and Advanced Practice Providers (APPs -  Physician Assistants and Nurse Practitioners) who all work together to provide you with the care you need, when you need it.  You will need a follow up appointment in 6 months (Late January/ early February) Please call our office 2 months in advance to schedule this appointment.  (call in early November to schedule).  You may see Kathlyn Sacramento, MD or one of the following Advanced Practice Providers on your designated Care Team:   Murray Hodgkins, NP Christell Faith, PA-C . Marrianne Mood, PA-C  Any Other Special Instructions Will Be Listed Below (If Applicable). - N/A

## 2019-01-18 NOTE — Telephone Encounter (Signed)
lvm sch appt

## 2019-01-18 NOTE — Telephone Encounter (Signed)
Please schedule routine follow-up in the next 1-3 months

## 2019-03-30 ENCOUNTER — Other Ambulatory Visit: Payer: Self-pay

## 2019-04-01 MED ORDER — LISINOPRIL 40 MG PO TABS: 40.0000 mg | ORAL_TABLET | Freq: Every day | ORAL | 1 refills | Status: DC

## 2019-04-16 ENCOUNTER — Telehealth: Payer: Self-pay | Admitting: Cardiovascular Disease

## 2019-04-16 ENCOUNTER — Other Ambulatory Visit: Payer: Self-pay | Admitting: *Deleted

## 2019-04-16 MED ORDER — CARVEDILOL 25 MG PO TABS: 25.0000 mg | ORAL_TABLET | Freq: Two times a day (BID) | ORAL | 0 refills | Status: DC

## 2019-04-16 NOTE — Telephone Encounter (Signed)
Requested Prescriptions   Signed Prescriptions Disp Refills  . carvedilol (COREG) 25 MG tablet 180 tablet 0    Sig: Take 1 tablet (25 mg total) by mouth 2 (two) times daily with a meal.    Authorizing Provider: ARIDA, MUHAMMAD A    Ordering User: Aleayah Chico C   

## 2019-04-16 NOTE — Telephone Encounter (Signed)
°*  STAT* If patient is at the pharmacy, call can be transferred to refill team.   1. Which medications need to be refilled? (please list name of each medication and dose if known) carvedilol (COREG) 25 MG 1 tablet 2 times daily   2. Which pharmacy/location (including street and city if local pharmacy) is medication to be sent to? [facility] in [place]   3. Do they need a 30 day or 90 day supply? 90 day

## 2019-04-16 NOTE — Telephone Encounter (Signed)
Requested Prescriptions   Signed Prescriptions Disp Refills  . carvedilol (COREG) 25 MG tablet 180 tablet 0    Sig: Take 1 tablet (25 mg total) by mouth 2 (two) times daily with a meal.    Authorizing Provider: Kathlyn Sacramento A    Ordering User: Britt Bottom

## 2019-05-01 ENCOUNTER — Other Ambulatory Visit: Payer: Self-pay

## 2019-05-01 NOTE — Telephone Encounter (Signed)
Okay to switch to Plavix with 300 mg loading dose.

## 2019-05-01 NOTE — Telephone Encounter (Signed)
Dr. Fletcher Anon,   Are you ok with [place] changing him to Plavix?

## 2019-05-02 ENCOUNTER — Telehealth: Payer: Self-pay | Admitting: Physician Assistant

## 2019-05-02 MED ORDER — CLOPIDOGREL BISULFATE 75 MG PO TABS: ORAL_TABLET | ORAL | 3 refills | Status: AC

## 2019-05-02 NOTE — Telephone Encounter (Signed)
Spoke with patient and reviewed recommendations to stop his Brilinta and start Plavix 300 mg x 1 then go to 75 mg once daily thereafter. Also reviewed that he should continue his aspirin 81 mg once daily. He verbalized understanding, agreeable with plan, and had no further questions at this time.

## 2019-05-02 NOTE — Addendum Note (Signed)
Addended by: Valora Corporal on: 05/02/2019 08:17 AM   Modules accepted: Orders

## 2019-05-02 NOTE — Telephone Encounter (Signed)
I have spoken to Dr. Fletcher Anon. Ok to stop Brilinta and change to Plavix. Please have patient stop Brilinta and start Plavix 300 mg x 1 then 75 mg daily thereafter. Continue aspirin 81 mg daily.

## 2019-07-10 ENCOUNTER — Other Ambulatory Visit: Payer: Self-pay

## 2019-07-10 DIAGNOSIS — E781 Pure hyperglyceridemia: Secondary | ICD-10-CM

## 2019-07-10 MED ORDER — ATORVASTATIN CALCIUM 80 MG PO TABS: 80.0000 mg | ORAL_TABLET | Freq: Every day | ORAL | 3 refills | Status: DC

## 2019-08-12 ENCOUNTER — Other Ambulatory Visit: Payer: Self-pay

## 2019-08-12 MED ORDER — CARVEDILOL 25 MG PO TABS: 25.0000 mg | ORAL_TABLET | Freq: Two times a day (BID) | ORAL | 0 refills | Status: DC

## 2019-09-13 ENCOUNTER — Other Ambulatory Visit: Payer: Self-pay

## 2019-09-13 MED ORDER — CARVEDILOL 25 MG PO TABS: 25.0000 mg | ORAL_TABLET | Freq: Two times a day (BID) | ORAL | 0 refills | Status: DC

## 2019-10-07 ENCOUNTER — Other Ambulatory Visit: Payer: Self-pay

## 2019-10-07 ENCOUNTER — Other Ambulatory Visit: Payer: Self-pay | Admitting: *Deleted

## 2019-10-07 MED ORDER — CARVEDILOL 25 MG PO TABS: 25.0000 mg | ORAL_TABLET | Freq: Two times a day (BID) | ORAL | 0 refills | Status: DC

## 2019-10-07 MED ORDER — LISINOPRIL 40 MG PO TABS: 40.0000 mg | ORAL_TABLET | Freq: Every day | ORAL | 0 refills | Status: DC

## 2019-10-16 ENCOUNTER — Telehealth: Payer: Self-pay | Admitting: Cardiovascular Disease

## 2019-10-16 NOTE — Telephone Encounter (Signed)
3 attempts to schedule fu appt from recall list.   Deleting recall.   

## 2019-10-25 ENCOUNTER — Other Ambulatory Visit: Payer: Self-pay

## 2019-10-25 ENCOUNTER — Telehealth: Payer: Self-pay

## 2019-10-25 MED ORDER — CARVEDILOL 25 MG PO TABS: 25.0000 mg | ORAL_TABLET | Freq: Two times a day (BID) | ORAL | 0 refills | Status: DC

## 2019-10-25 NOTE — Telephone Encounter (Signed)
Contacted the patient regarding his mychart message.  Patient is past due for an appointment. His medications have bee refilled for  minimum quantities more than once with the message for him to contact our office to schedule an appt, which he has not.  Called the patient to assist him in scheduling an appt. Left a message for the patient to call back and schedule an appt.

## 2019-11-11 ENCOUNTER — Other Ambulatory Visit: Payer: Self-pay

## 2019-11-11 MED ORDER — CARVEDILOL 25 MG PO TABS: 25.0000 mg | ORAL_TABLET | Freq: Two times a day (BID) | ORAL | 0 refills | Status: DC

## 2019-12-06 ENCOUNTER — Other Ambulatory Visit: Payer: Self-pay

## 2019-12-06 MED ORDER — LISINOPRIL 40 MG PO TABS: 40.0000 mg | ORAL_TABLET | Freq: Every day | ORAL | 0 refills | Status: DC

## 2019-12-06 MED ORDER — CARVEDILOL 25 MG PO TABS: 25.0000 mg | ORAL_TABLET | Freq: Two times a day (BID) | ORAL | 0 refills | Status: DC

## 2019-12-25 ENCOUNTER — Other Ambulatory Visit: Payer: Self-pay

## 2019-12-26 ENCOUNTER — Other Ambulatory Visit: Payer: Self-pay

## 2019-12-27 ENCOUNTER — Other Ambulatory Visit: Payer: Self-pay

## 2019-12-27 NOTE — Telephone Encounter (Signed)
Left voicemail for patient to call office to discuss with Practice Administrator.

## 2019-12-28 ENCOUNTER — Other Ambulatory Visit: Payer: Self-pay

## 2019-12-29 ENCOUNTER — Other Ambulatory Visit: Payer: Self-pay

## 2019-12-30 ENCOUNTER — Other Ambulatory Visit: Payer: Self-pay

## 2019-12-30 NOTE — Telephone Encounter (Signed)
Called patient again to discuss our policy for needing to be seen each year in order to refill medications had to leave another voicemail.  If I do not hear back today, will send message via MyChart

## 2019-12-31 ENCOUNTER — Other Ambulatory Visit: Payer: Self-pay

## 2020-01-02 MED ORDER — CARVEDILOL 25 MG PO TABS: 25.0000 mg | ORAL_TABLET | Freq: Two times a day (BID) | ORAL | 0 refills | Status: DC

## 2020-01-02 NOTE — Addendum Note (Signed)
Addended by: Jeannetta Nap on: 01/02/2020 08:14 AM   Modules accepted: Orders

## 2020-01-02 NOTE — Addendum Note (Signed)
Addended by: Jeannetta Nap on: 01/02/2020 09:45 AM   Modules accepted: Orders

## 2020-01-02 NOTE — Telephone Encounter (Signed)
Routing to [employer] for further advice.

## 2020-01-16 ENCOUNTER — Other Ambulatory Visit: Payer: Self-pay

## 2020-01-16 ENCOUNTER — Ambulatory Visit (INDEPENDENT_AMBULATORY_CARE_PROVIDER_SITE_OTHER): Payer: BC Managed Care – PPO | Admitting: Cardiovascular Disease

## 2020-01-16 ENCOUNTER — Encounter: Payer: Self-pay | Admitting: Cardiovascular Disease

## 2020-01-16 VITALS — BP 154/120 | HR 73 | Ht 68.0 in | Wt 222.0 lb

## 2020-01-16 DIAGNOSIS — E781 Pure hyperglyceridemia: Secondary | ICD-10-CM

## 2020-01-16 DIAGNOSIS — I25119 Atherosclerotic heart disease of native coronary artery with unspecified angina pectoris: Secondary | ICD-10-CM

## 2020-01-16 MED ORDER — CARVEDILOL 25 MG PO TABS: 25.0000 mg | ORAL_TABLET | Freq: Two times a day (BID) | ORAL | 4 refills | Status: DC

## 2020-01-16 MED ORDER — LISINOPRIL 40 MG PO TABS: 40.0000 mg | ORAL_TABLET | Freq: Every day | ORAL | 4 refills | Status: DC

## 2020-01-16 MED ORDER — ATORVASTATIN CALCIUM 80 MG PO TABS: 80.0000 mg | ORAL_TABLET | Freq: Every day | ORAL | 4 refills | Status: DC

## 2020-01-16 MED ORDER — CLOPIDOGREL BISULFATE 75 MG PO TABS: 75.0000 mg | ORAL_TABLET | Freq: Every day | ORAL | 4 refills | Status: DC

## 2020-01-16 NOTE — Patient Instructions (Addendum)
Medication Instructions:  No changes  If you need a refill on your cardiac medications before your next appointment, please call your pharmacy.    Lab work: No new labs needed   If you have labs (blood work) drawn today and your tests are completely normal, you will receive your results only by: Marland Kitchen MyChart Message (if you have MyChart) OR . A paper copy in the mail If you have any lab test that is abnormal or we need to change your treatment, we will call you to review the results.   Testing/Procedures: No new testing needed   Follow-Up: At [facility], you and your health needs are our priority.  As part of our continuing mission to provide you with exceptional heart care, we have created designated Provider Care Teams.  These Care Teams include your primary Cardiologist (physician) and Advanced Practice Providers (APPs -  Physician Assistants and Nurse Practitioners) who all work together to provide you with the care you need, when you need it.  . You will need a follow up appointment in 12 months Dr. Kirke Corin   . Providers on your designated Care Team:   . Nicolasa Ducking, NP . Eula Listen, PA-C . Marisue Ivan, PA-C  Any Other Special Instructions Will Be Listed Below (If Applicable).  COVID-19 Vaccine Information can be found at: [email or web address] For questions related to vaccine distribution or appointments, please email [email or web address] or call [phone].

## 2020-01-16 NOTE — Progress Notes (Signed)
Cardiology Office Note   Date:  01/16/2020   ID:  Edward Powell, DOB 05/01/1971, MRN [id number]  PCP:  Kerman Passey, MD  Cardiologist:   Lorine Bears, MD   Chief Complaint  Patient presents with   other    6 month follow up. Meds reviewed by the pt. verbally. "doing well."       History of Present Illness: Edward Powell is a 49 y.o. male with past medical history of Coronary artery disease status post  non-ST elevation myocardial infarction and CABG in July 2019 severe hyperlipidemia,  Former tobacco use,  excessive alcohol use per the notes Hypertension Who presents for routine follow-up of his coronary disease  Previously seen by telemetry visit January 18, 2019 by one of our other providers Last seen by Dr. Kirke Corin February 2020 He has not had lab work since 2019 He does not have primary care, previous primary care provider has retired  Prior history reviewed, non-ST elevation myocardial infarction in July, 2019 in the setting of severely elevated blood pressure.   Cardiac catheterization showed severe three-vessel coronary artery disease with normal ejection fraction.  Echocardiogram showed an EF of 55 to 60%, grade 1 diastolic dysfunction and no significant valvular abnormalities.    CABG on July 24 with LIMA to LAD, sequential SVG to ramus and distal left circumflex and SVG to right PDA. He quit smoking after his CABG and decreased alcohol intake.   He was able to lose weight after surgery.  He had recurrent angina in December 2019. He underwent a  Lexiscan Myoview which was high risk with evidence of large inferior lateral and lateral ischemia and more than 1 mm of ST depression at low level exercise.    cardiac catheterization which showed significant three-vessel coronary artery disease with patent LIMA to LAD and patent SVG to ramus.  There was an occluded jump graft from SVG to OM 3 and SVG to right PDA.  The left circumflex was chronically occluded with  collaterals.  The RCA was diffusely diseased in multiple areas.  EF was 35 to 45% with moderately elevated left ventricular end-diastolic pressure.    PCI of the RCA which was performed with 3 drug-eluting stent placement.  He had a repeat echocardiogram in March 2020 which showed improvement in ejection fraction to 55 to 60%.  Active, denies any anginal symptoms Running out of his medications, Systolic pressure typically 135, diastolic pressure 90-95 He does check it several times a week at home  Weight has been trending upwards, he is up 20 pounds in the past year and a half  EKG personally reviewed by myself on todays visit Shows normal sinus rhythm rate 73 bpm no significant ST-T wave changes, consider old inferior MI  Past Medical History:  Diagnosis Date   Alcohol abuse    CAD (coronary artery disease)    a. 12/2017 NSTEMI/Cath: LM 50d, LAD 60ost, 46m, RI nl, LCX 100p, OM2 fills via collats from LAD, RCA 70p, 33m, 24m, 60d, EF 55-65%; b. 12/2017 CABG x 4: LIMA->LAD, VG->RI->LCX, VG->RPDA.   Diastolic dysfunction    a. 12/2017 Echo: EF 55-60%, no rwma, Gr1 DD, triv MR, nl RV fxn.   Essential hypertension    Hyperlipidemia LDL goal <70    Hypertriglyceridemia    Status post primary angioplasty with coronary stent 06/15/2018   Indefinite dual antiplatelet therapy per Dec 2019 note, Dr. Kirke Corin   Tobacco abuse     Past Surgical History:  Procedure  Laterality Date   CORONARY ARTERY BYPASS GRAFT N/A 01/10/2018   Procedure: CORONARY ARTERY BYPASS GRAFTING (CABG);  Surgeon: Delight Ovens, MD;  Location: [facility] OR;  Service: Open Heart Surgery;  Laterality: N/A;  Times 4 using left internal mammary artery to the LAD and endoscopically harvested left saphenous vein to OM1, OM2, and PLB   CORONARY STENT INTERVENTION N/A 06/14/2018   Procedure: CORONARY STENT INTERVENTION;  Surgeon: Iran Ouch, MD;  Location: [facility] INVASIVE CV LAB;  Service: Cardiovascular;  Laterality: N/A;     LEFT HEART CATH AND CORONARY ANGIOGRAPHY N/A 01/09/2018   Procedure: LEFT HEART CATH AND CORONARY ANGIOGRAPHY;  Surgeon: Yvonne Kendall, MD;  Location: [facility] INVASIVE CV LAB;  Service: Cardiovascular;  Laterality: N/A;   LEFT HEART CATH AND CORONARY ANGIOGRAPHY N/A 05/31/2018   Procedure: LEFT HEART CATH AND CORONARY ANGIOGRAPHY;  Surgeon: Iran Ouch, MD;  Location: [facility] INVASIVE CV LAB;  Service: Cardiovascular;  Laterality: N/A;   NO PAST SURGERIES     TEE WITHOUT CARDIOVERSION N/A 01/10/2018   Procedure: TRANSESOPHAGEAL ECHOCARDIOGRAM (TEE);  Surgeon: Delight Ovens, MD;  Location: [facility] OR;  Service: Open Heart Surgery;  Laterality: N/A;     Current Outpatient Medications  Medication Sig Dispense Refill   acetaminophen (TYLENOL) 325 MG tablet Take 2 tablets (650 mg total) by mouth every 6 (six) hours as needed for mild pain (or Fever >/= 101).     aspirin EC 81 MG EC tablet Take 1 tablet (81 mg total) by mouth daily.     atorvastatin (LIPITOR) 80 MG tablet Take 1 tablet (80 mg total) by mouth daily at 6 PM. 90 tablet 4   carvedilol (COREG) 25 MG tablet Take 1 tablet (25 mg total) by mouth 2 (two) times daily with a meal. 180 tablet 4   clopidogrel (PLAVIX) 75 MG tablet Take 1 tablet (75 mg total) by mouth daily. 90 tablet 4   lisinopril (ZESTRIL) 40 MG tablet Take 1 tablet (40 mg total) by mouth daily. 90 tablet 4   No current facility-administered medications for this visit.    Allergies:   Patient has no known allergies.    Social History:  The patient  reports that he has quit smoking. He has a 30.00 pack-year smoking history. He has never used smokeless tobacco. He reports previous alcohol use of about 9.0 standard drinks of alcohol per week. He reports previous drug use.   Family History:  The patient's family history includes Alzheimer's disease in his paternal grandmother; Atrial fibrillation in his father; Breast cancer in his mother; Heart attack in his  maternal grandmother; Hyperlipidemia in his brother, maternal grandmother, and mother; Hypertension in his brother, father, maternal grandmother, mother, and paternal grandmother; Liver cancer in his father; Lung cancer in his father; Stroke in his brother, maternal grandmother, and paternal grandmother.    Review of Systems  Constitutional: Negative.   HENT: Negative.   Respiratory: Negative.   Cardiovascular: Negative.   Gastrointestinal: Negative.   Musculoskeletal: Negative.   Neurological: Negative.   Psychiatric/Behavioral: Negative.   All other systems reviewed and are negative.   PHYSICAL EXAM: VS:  BP (!) 154/120 (BP Location: Left Arm, Patient Position: Sitting, Cuff Size: Normal)    Pulse 73    Ht 5\' 8"  (1.727 m)    Wt (!) 222 lb (100.7 kg)    SpO2 98%    BMI 33.75 kg/m  , BMI Body mass index is 33.75 kg/m. GEN: Well nourished, well developed, in no  acute distress  HEENT: normal  Neck: no JVD, carotid bruits, or masses Cardiac: RRR; no murmurs, rubs, or gallops,no edema  Respiratory:  clear to auscultation bilaterally, normal work of breathing GI: soft, nontender, nondistended, + BS MS: no deformity or atrophy  Skin: warm and dry, no rash Neuro:  Strength and sensation are intact Psych: euthymic mood, full affect Left radial pulses normal.   EKG personally reviewed by myself on todays visit As detailed above, normal sinus rhythm with rate 73 bpm consider old inferior MI  Recent Labs: No results found for requested labs within last 8760 hours.    Lipid Panel    Component Value Date/Time   CHOL 135 03/06/2018 0845   TRIG 245 (H) 03/06/2018 0845   HDL 32 (L) 03/06/2018 0845   CHOLHDL 4.2 03/06/2018 0845   CHOLHDL 5.5 01/09/2018 0510   VLDL 53 (H) 01/09/2018 0510   LDLCALC 54 03/06/2018 0845      Wt Readings from Last 3 Encounters:  01/16/20 (!) 222 lb (100.7 kg)  01/18/19 202 lb (91.6 kg)  08/21/18 196 lb (88.9 kg)        No flowsheet data  found.    ASSESSMENT AND PLAN:  1.  Coronary artery disease involving native coronary arteries without angina:  ---No anginal symptoms since PCI of the right coronary artery   Continue aggressive medical therapy.   Discussed continue aspirin and Plavix, he prefers to stay on both -No ischemic work-up at this time Weight up 20 pounds since early 2020, stressed the need for lab work and lifestyle modification  2.  Hyperlipidemia: Likely a familial component Lab work orders were placed 1 year ago but he did not complete this Orders placed again, he has lab work slips for [employer] he can do at his convenience Goal LDL less than 70 Last available lipid panel was from 2 years ago, numbers discussed in detail, previous total cholesterol was more than 300 which had improved down to 132 years ago on Lipitor  3.  Essential hypertension:  Elevated diastolic probably 90-95 Recommend he continue to monitor, may need extra half dose carvedilol if pressure continues to run high Lifestyle modification recommended, weight up 20 pounds in the past 2 years  Discussed need to establish with primary care .  They can also help with his medication refills   Total encounter time more than 25 minutes  Greater than 50% was spent in counseling and coordination of care with the patient  Follow-up with Dr. Kirke Corin 12 months   Signed, Dossie Arbour, MD, Ph.D [facility]

## 2020-05-02 ENCOUNTER — Encounter: Payer: Self-pay | Admitting: Emergency Medicine

## 2020-05-02 ENCOUNTER — Other Ambulatory Visit: Payer: Self-pay

## 2020-05-02 ENCOUNTER — Inpatient Hospital Stay
Admission: EM | Admit: 2020-05-02 | Discharge: 2020-05-06 | DRG: 603 | Disposition: A | Discharge status: Home / Self Care | Payer: BC Managed Care – PPO | Attending: Internal Medicine | Admitting: Internal Medicine

## 2020-05-02 ENCOUNTER — Emergency Department: Payer: BC Managed Care – PPO

## 2020-05-02 DIAGNOSIS — Z8249 Family history of ischemic heart disease and other diseases of the circulatory system: Secondary | ICD-10-CM | POA: Diagnosis not present

## 2020-05-02 DIAGNOSIS — I2510 Atherosclerotic heart disease of native coronary artery without angina pectoris: Secondary | ICD-10-CM | POA: Diagnosis present

## 2020-05-02 DIAGNOSIS — Z20822 Contact with and (suspected) exposure to covid-19: Secondary | ICD-10-CM | POA: Diagnosis not present

## 2020-05-02 DIAGNOSIS — I10 Essential (primary) hypertension: Secondary | ICD-10-CM | POA: Diagnosis not present

## 2020-05-02 DIAGNOSIS — Z83438 Family history of other disorder of lipoprotein metabolism and other lipidemia: Secondary | ICD-10-CM | POA: Diagnosis not present

## 2020-05-02 DIAGNOSIS — Z87891 Personal history of nicotine dependence: Secondary | ICD-10-CM | POA: Diagnosis not present

## 2020-05-02 DIAGNOSIS — Z951 Presence of aortocoronary bypass graft: Secondary | ICD-10-CM | POA: Diagnosis not present

## 2020-05-02 DIAGNOSIS — Z79899 Other long term (current) drug therapy: Secondary | ICD-10-CM

## 2020-05-02 DIAGNOSIS — Z7982 Long term (current) use of aspirin: Secondary | ICD-10-CM | POA: Diagnosis not present

## 2020-05-02 DIAGNOSIS — L03114 Cellulitis of left upper limb: Secondary | ICD-10-CM | POA: Diagnosis not present

## 2020-05-02 DIAGNOSIS — Z955 Presence of coronary angioplasty implant and graft: Secondary | ICD-10-CM | POA: Diagnosis not present

## 2020-05-02 DIAGNOSIS — E781 Pure hyperglyceridemia: Secondary | ICD-10-CM | POA: Diagnosis not present

## 2020-05-02 DIAGNOSIS — M778 Other enthesopathies, not elsewhere classified: Secondary | ICD-10-CM | POA: Diagnosis not present

## 2020-05-02 DIAGNOSIS — R600 Localized edema: Secondary | ICD-10-CM | POA: Diagnosis not present

## 2020-05-02 DIAGNOSIS — I252 Old myocardial infarction: Secondary | ICD-10-CM | POA: Diagnosis not present

## 2020-05-02 DIAGNOSIS — Z7902 Long term (current) use of antithrombotics/antiplatelets: Secondary | ICD-10-CM

## 2020-05-02 DIAGNOSIS — M779 Enthesopathy, unspecified: Secondary | ICD-10-CM | POA: Diagnosis not present

## 2020-05-02 DIAGNOSIS — I25119 Atherosclerotic heart disease of native coronary artery with unspecified angina pectoris: Secondary | ICD-10-CM | POA: Diagnosis not present

## 2020-05-02 DIAGNOSIS — M65842 Other synovitis and tenosynovitis, left hand: Secondary | ICD-10-CM | POA: Diagnosis not present

## 2020-05-02 DIAGNOSIS — E785 Hyperlipidemia, unspecified: Secondary | ICD-10-CM | POA: Diagnosis present

## 2020-05-02 DIAGNOSIS — M79642 Pain in left hand: Secondary | ICD-10-CM

## 2020-05-02 DIAGNOSIS — L299 Pruritus, unspecified: Secondary | ICD-10-CM | POA: Diagnosis not present

## 2020-05-02 DIAGNOSIS — I25118 Atherosclerotic heart disease of native coronary artery with other forms of angina pectoris: Secondary | ICD-10-CM | POA: Diagnosis present

## 2020-05-02 DIAGNOSIS — W5503XA Scratched by cat, initial encounter: Secondary | ICD-10-CM

## 2020-05-02 DIAGNOSIS — M7989 Other specified soft tissue disorders: Secondary | ICD-10-CM | POA: Diagnosis not present

## 2020-05-02 LAB — LACTIC ACID, PLASMA
Lactic Acid, Venous: 1.5 mmol/L (ref 0.5–1.9)
Lactic Acid, Venous: 1.4 mmol/L (ref 0.5–1.9)

## 2020-05-02 LAB — COMPREHENSIVE METABOLIC PANEL
ALT: 54 U/L — ABNORMAL HIGH (ref 0–44)
AST: 29 U/L (ref 15–41)
Albumin: 4.4 g/dL (ref 3.5–5.0)
Alkaline Phosphatase: 99 U/L (ref 38–126)
Anion gap: 10 (ref 5–15)
BUN: 24 mg/dL — ABNORMAL HIGH (ref 6–20)
CO2: 23 mmol/L (ref 22–32)
Calcium: 9.4 mg/dL (ref 8.9–10.3)
Chloride: 103 mmol/L (ref 98–111)
Creatinine, Ser: 0.97 mg/dL (ref 0.61–1.24)
GFR, Estimated: >60 mL/min (ref >60)
Glucose, Bld: 172 mg/dL — ABNORMAL HIGH (ref 70–99)
Potassium: 3.4 mmol/L — ABNORMAL LOW (ref 3.5–5.1)
Sodium: 136 mmol/L (ref 135–145)
Total Bilirubin: 0.9 mg/dL (ref 0.3–1.2)
Total Protein: 8.2 g/dL — ABNORMAL HIGH (ref 6.5–8.1)

## 2020-05-02 LAB — CBC
HCT: 39.4 % (ref 39.0–52.0)
Hemoglobin: 13.6 g/dL (ref 13.0–17.0)
MCH: 32.4 pg (ref 26.0–34.0)
MCHC: 34.5 g/dL (ref 30.0–36.0)
MCV: 93.8 fL (ref 80.0–100.0)
Platelets: 206 10*3/uL (ref 150–400)
RBC: 4.2 MIL/uL — ABNORMAL LOW (ref 4.22–5.81)
RDW: 12.9 % (ref 11.5–15.5)
WBC: 9.3 10*3/uL (ref 4.0–10.5)
nRBC: 0 % (ref 0.0–0.2)

## 2020-05-02 LAB — SEDIMENTATION RATE: Sed Rate: 48 mm/hr — ABNORMAL HIGH (ref 0–20)

## 2020-05-02 IMAGING — CR DG HAND COMPLETE 3+V*L*
1 series · 3 of 3 positions shown · non-contrast
Comparison: None.

CLINICAL DATA: 48-year-old male with left hand pain and swelling.

EXAM:
LEFT HAND - COMPLETE 3+ VIEW

[Series 1: dg hand complete left · 0.14mm/px · 3 of 3 slices shown]
[im 1/3]
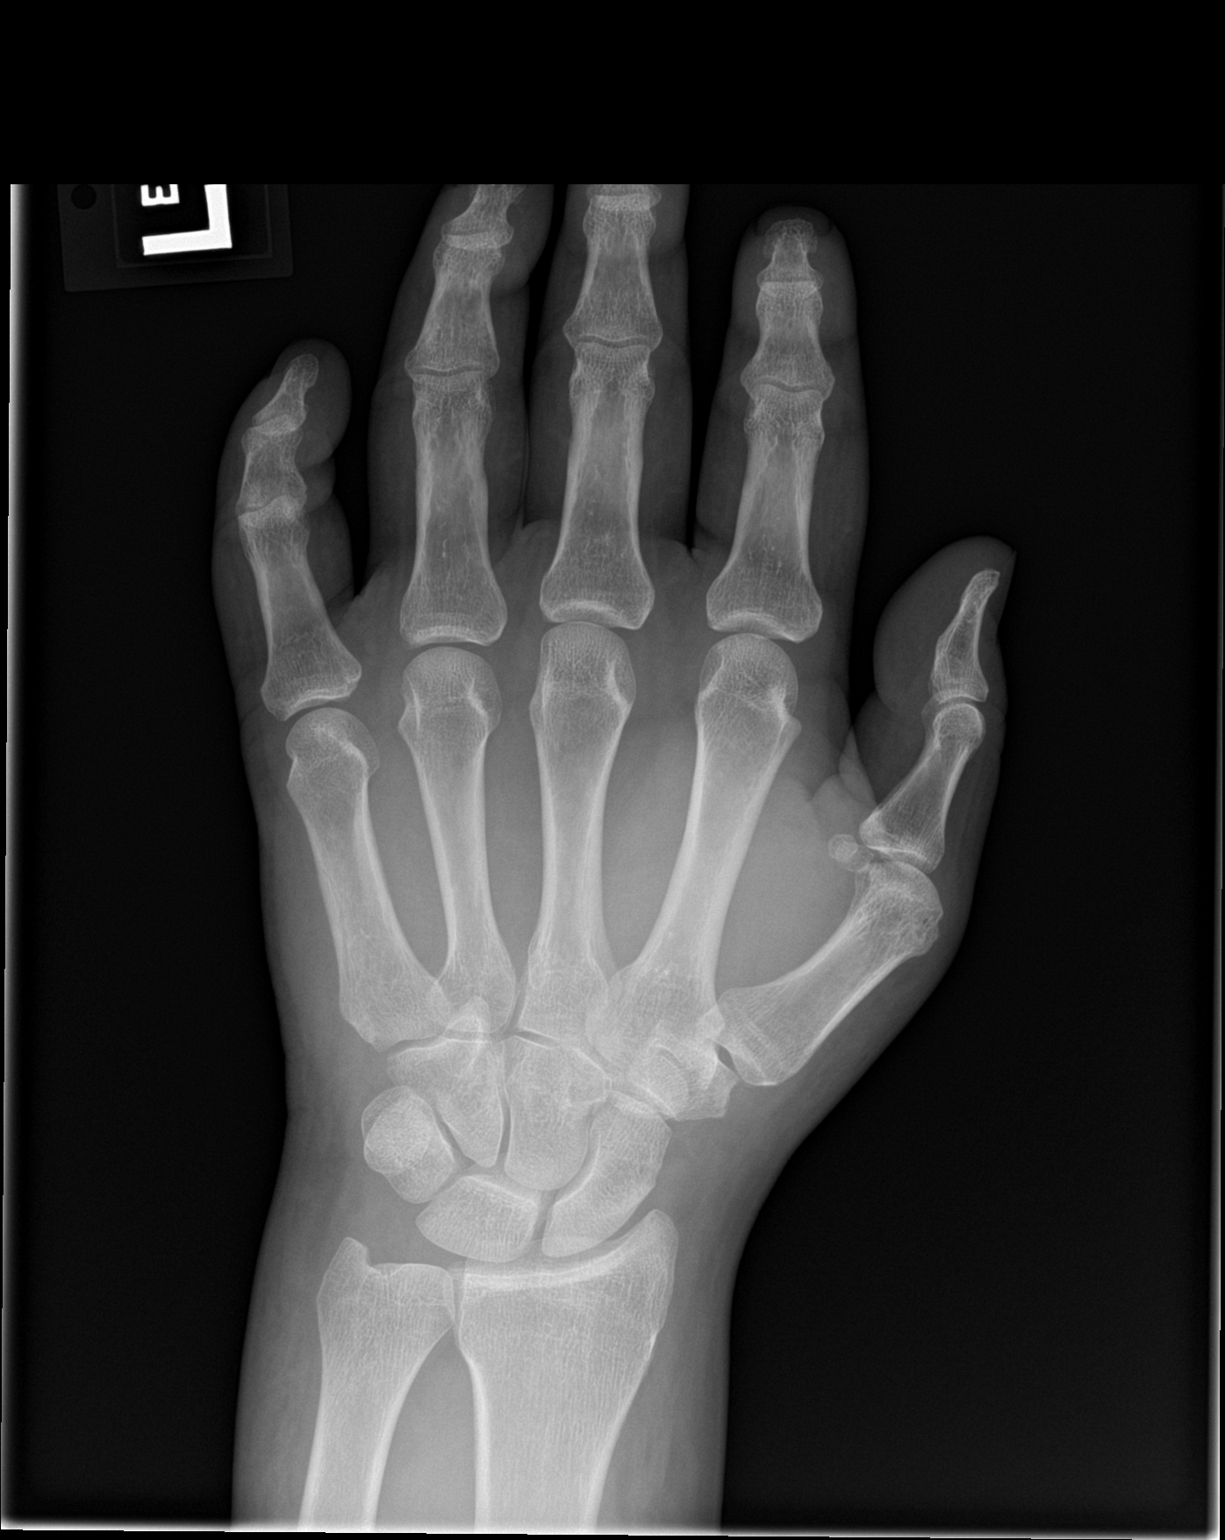
[im 2/3]
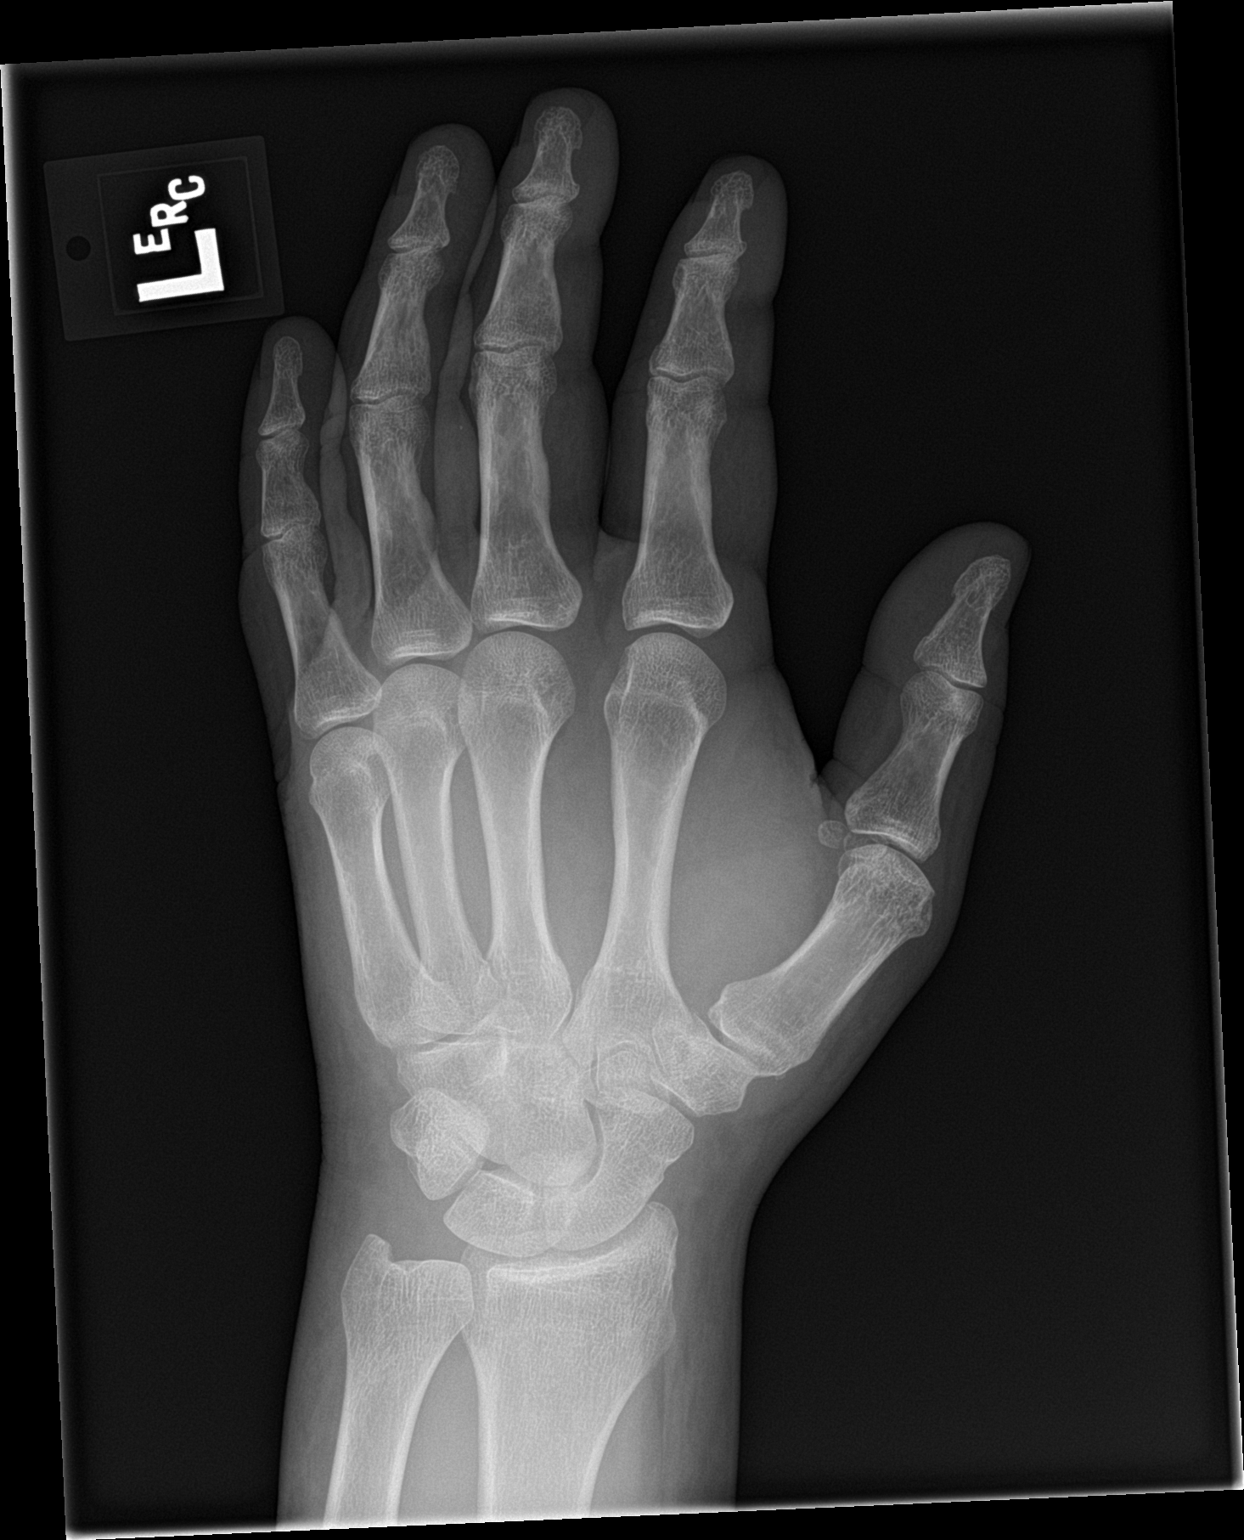
[im 3/3]
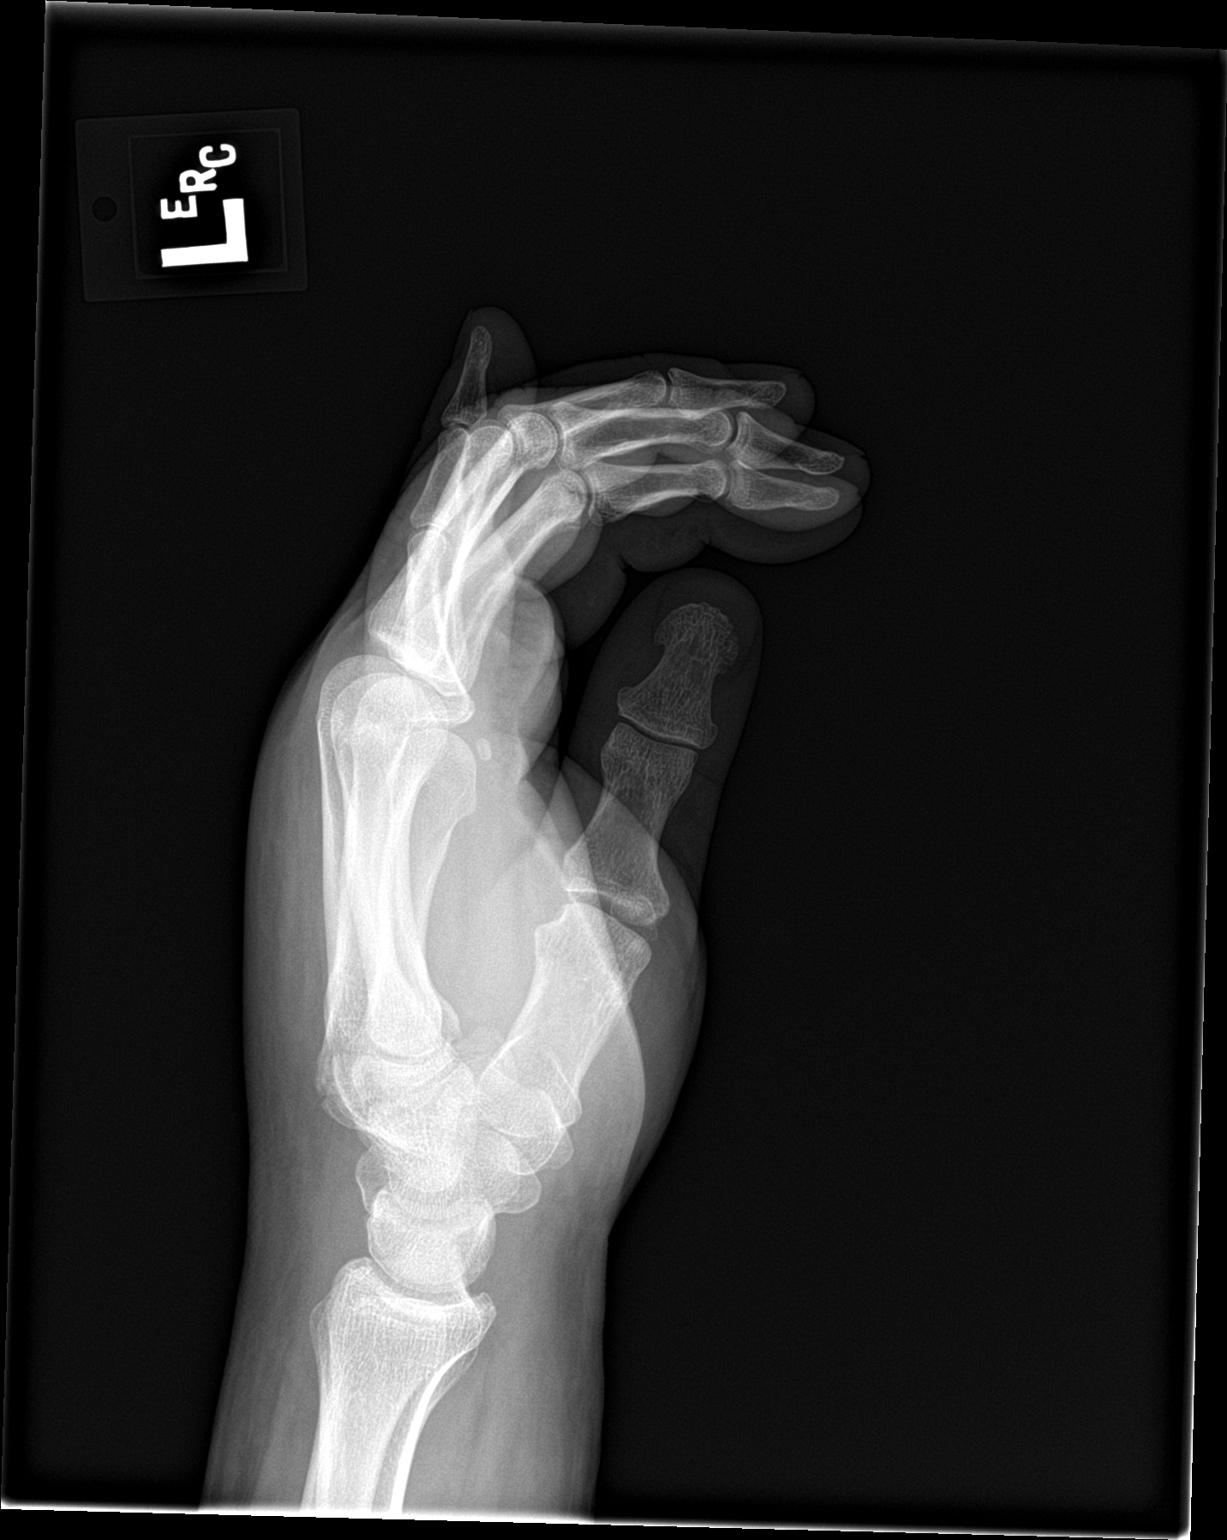

[3 of 3 positions shown; findings below may reference images not displayed]

FINDINGS: There is no acute fracture or dislocation. The bones are well
mineralized. No significant arthritic changes. There is diffuse soft
tissue swelling of the hand. No radiopaque foreign object or soft
tissue gas.
IMPRESSION: 1. No acute osseous pathology.
2. Mild diffuse soft tissue swelling.

## 2020-05-02 MED ORDER — ONDANSETRON HCL 4 MG/2ML IJ SOLN
4.0000 mg | Freq: Once | INTRAMUSCULAR | Status: AC
  Administered 2020-05-02: 4 mg via INTRAVENOUS
  Filled 2020-05-02: qty 2

## 2020-05-02 MED ORDER — HYDROMORPHONE HCL 1 MG/ML IJ SOLN
0.5000 mg | Freq: Once | INTRAMUSCULAR | Status: AC
  Administered 2020-05-02: 0.5 mg via INTRAVENOUS
  Filled 2020-05-02: qty 1

## 2020-05-02 NOTE — ED Notes (Signed)
Pt with hx of blood thinners for open heart surgery in the past presents with L hand swelling for 2 days. Pt denies injury to hand.

## 2020-05-02 NOTE — ED Triage Notes (Signed)
Patient states that he woke up two mornings ago with left hand swelling. Patient states that the swelling has become worse. Patient does not recall any injury. hand warm to the touch. Patient positive for radial pulse.

## 2020-05-03 ENCOUNTER — Emergency Department: Payer: BC Managed Care – PPO

## 2020-05-03 DIAGNOSIS — Z951 Presence of aortocoronary bypass graft: Secondary | ICD-10-CM | POA: Diagnosis not present

## 2020-05-03 DIAGNOSIS — W5503XA Scratched by cat, initial encounter: Secondary | ICD-10-CM | POA: Diagnosis not present

## 2020-05-03 DIAGNOSIS — Z8249 Family history of ischemic heart disease and other diseases of the circulatory system: Secondary | ICD-10-CM | POA: Diagnosis not present

## 2020-05-03 DIAGNOSIS — Z79899 Other long term (current) drug therapy: Secondary | ICD-10-CM | POA: Diagnosis not present

## 2020-05-03 DIAGNOSIS — I2510 Atherosclerotic heart disease of native coronary artery without angina pectoris: Secondary | ICD-10-CM | POA: Diagnosis present

## 2020-05-03 DIAGNOSIS — M779 Enthesopathy, unspecified: Secondary | ICD-10-CM | POA: Diagnosis present

## 2020-05-03 DIAGNOSIS — L03114 Cellulitis of left upper limb: Principal | ICD-10-CM

## 2020-05-03 DIAGNOSIS — M79642 Pain in left hand: Secondary | ICD-10-CM | POA: Diagnosis present

## 2020-05-03 DIAGNOSIS — L299 Pruritus, unspecified: Secondary | ICD-10-CM | POA: Diagnosis not present

## 2020-05-03 DIAGNOSIS — Z955 Presence of coronary angioplasty implant and graft: Secondary | ICD-10-CM | POA: Diagnosis not present

## 2020-05-03 DIAGNOSIS — I25119 Atherosclerotic heart disease of native coronary artery with unspecified angina pectoris: Secondary | ICD-10-CM

## 2020-05-03 DIAGNOSIS — Z83438 Family history of other disorder of lipoprotein metabolism and other lipidemia: Secondary | ICD-10-CM | POA: Diagnosis not present

## 2020-05-03 DIAGNOSIS — I252 Old myocardial infarction: Secondary | ICD-10-CM | POA: Diagnosis not present

## 2020-05-03 DIAGNOSIS — Z20822 Contact with and (suspected) exposure to covid-19: Secondary | ICD-10-CM | POA: Diagnosis present

## 2020-05-03 DIAGNOSIS — Z7982 Long term (current) use of aspirin: Secondary | ICD-10-CM | POA: Diagnosis not present

## 2020-05-03 DIAGNOSIS — E781 Pure hyperglyceridemia: Secondary | ICD-10-CM | POA: Diagnosis present

## 2020-05-03 DIAGNOSIS — E785 Hyperlipidemia, unspecified: Secondary | ICD-10-CM | POA: Diagnosis present

## 2020-05-03 DIAGNOSIS — Z7902 Long term (current) use of antithrombotics/antiplatelets: Secondary | ICD-10-CM | POA: Diagnosis not present

## 2020-05-03 DIAGNOSIS — I10 Essential (primary) hypertension: Secondary | ICD-10-CM | POA: Diagnosis present

## 2020-05-03 DIAGNOSIS — Z87891 Personal history of nicotine dependence: Secondary | ICD-10-CM | POA: Diagnosis not present

## 2020-05-03 LAB — HIV ANTIBODY (ROUTINE TESTING W REFLEX): HIV Screen 4th Generation wRfx: NONREACTIVE

## 2020-05-03 LAB — CK: Total CK: 80 U/L (ref 49–397)

## 2020-05-03 LAB — RESPIRATORY PANEL BY RT PCR (FLU A&B, COVID)
Influenza A by PCR: NEGATIVE
Influenza B by PCR: NEGATIVE
SARS Coronavirus 2 by RT PCR: NEGATIVE

## 2020-05-03 LAB — C-REACTIVE PROTEIN: CRP: 2.3 mg/dL — ABNORMAL HIGH (ref <1.0)

## 2020-05-03 IMAGING — MR MR [PERSON_NAME]*[PERSON_NAME]* WO/W CM
8 series · 40 of 40 positions shown · IV contrast (gadavist)
Comparison: [DATE]

CLINICAL DATA: Left hand swelling starting 2 days ago.

EXAM:
MRI OF THE LEFT HAND WITHOUT AND WITH CONTRAST
TECHNIQUE: Multiplanar, multisequence MR imaging of the left hand was performed
before and after the administration of intravenous contrast. Axial
images seem focused on the middle finger. Coronal images exclude the
distal phalanges of the index, middle, and ring finger.
CONTRAST:  7.5mL GADAVIST GADOBUTROL 1 MMOL/ML IV SOLN

[Series 1090: ax t1_new · sagittal · left · 4.5mm · 0.31mm/px · 7 of 40 slices shown]
[im 1/40]
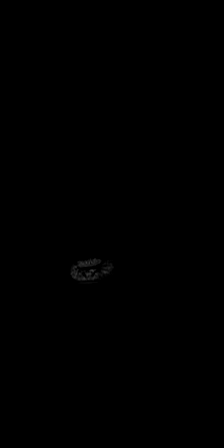
[im 7/40]
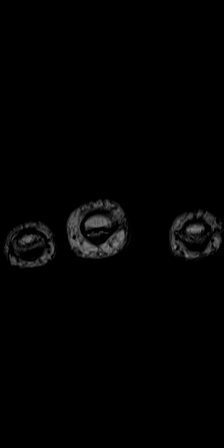
[im 14/40]
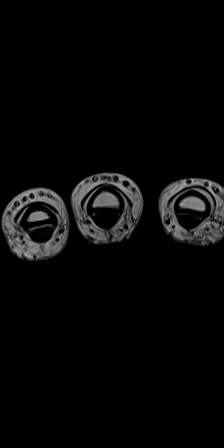
[im 20/40]
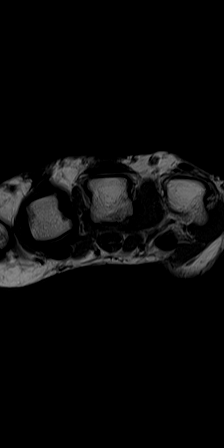
[im 27/40]
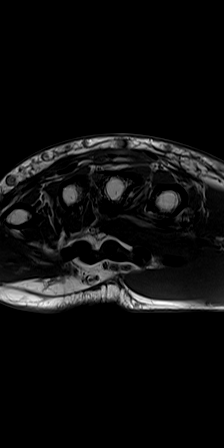
[im 33/40]
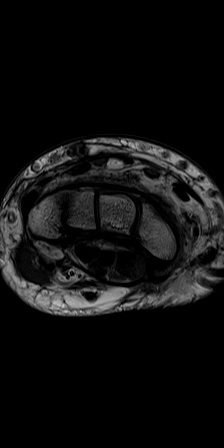
[im 40/40]
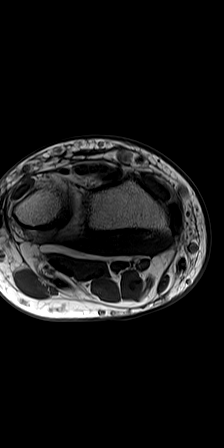

[Series 1097: T2 · sagittal · left · 4.5mm · 0.31mm/px · 6 of 40 slices shown]
[im 1/40]
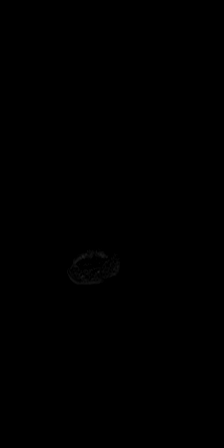
[im 8/40]
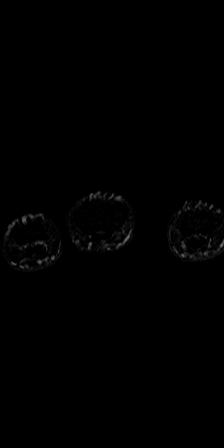
[im 16/40]
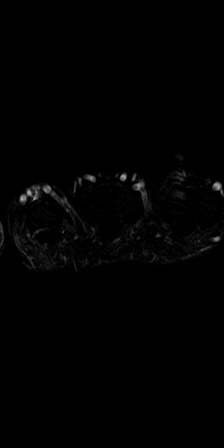
[im 24/40]
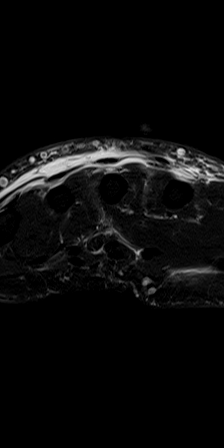
[im 32/40]
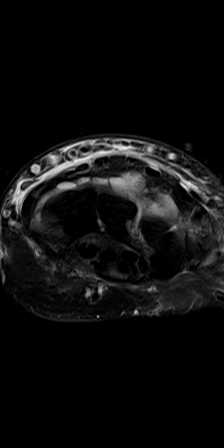
[im 40/40]
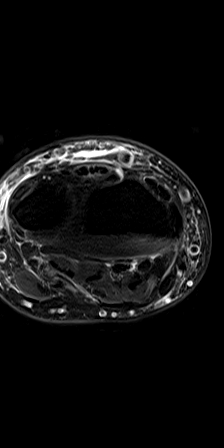

[Series 1111: cor stir_new. · coronal · left · 3.0mm · 0.47mm/px · 3 of 20 slices shown]
[im 1/20]
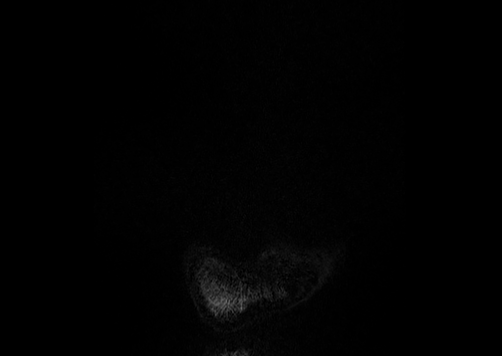
[im 10/20]
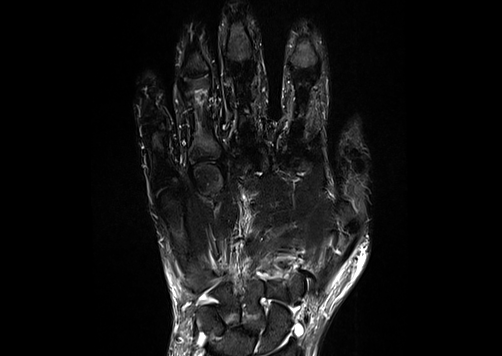
[im 20/20]
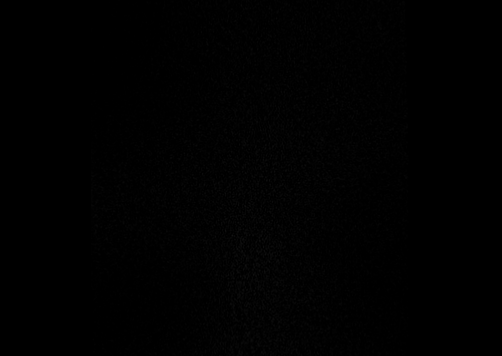

[Series 1118: cor t1_new · coronal · left · 3.0mm · 0.47mm/px · 3 of 20 slices shown]
[im 1/20]
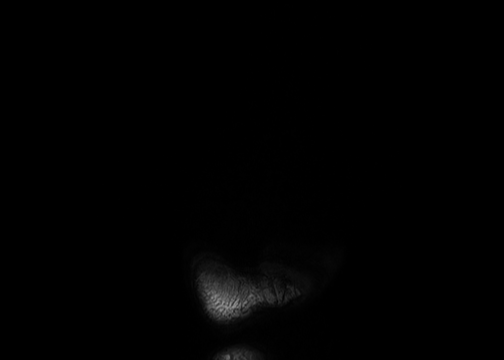
[im 10/20]
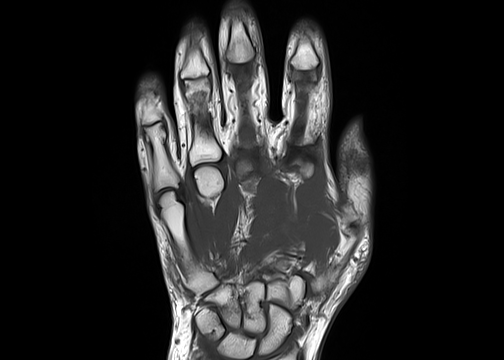
[im 20/20]
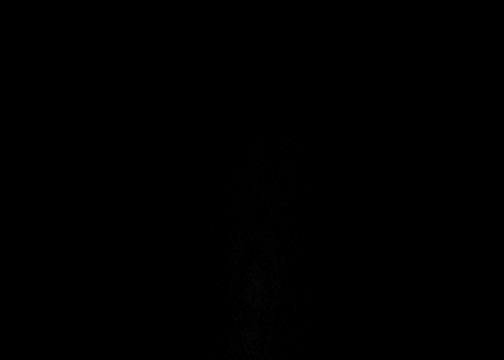

[Series 1125: PD · axial · left · 3.0mm · 0.45mm/px · z∈[-116,-17]mm · 6 of 38 slices shown]
[im 1/38]
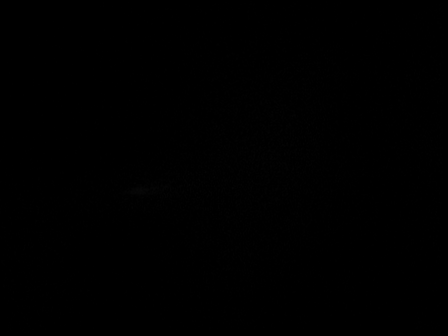
[im 8/38]
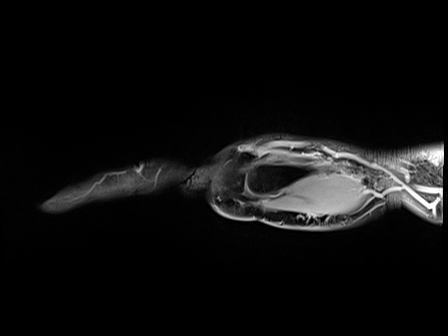
[im 15/38]
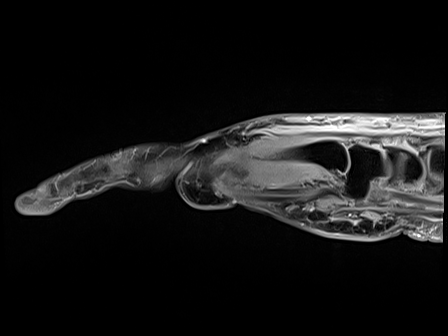
[im 23/38]
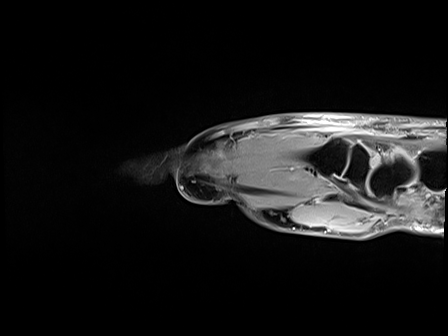
[im 30/38]
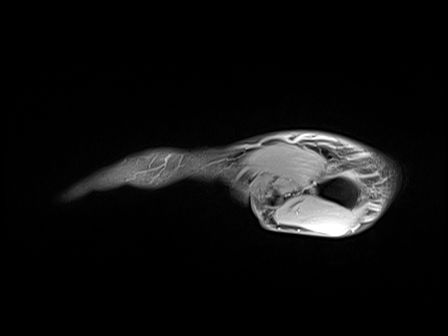
[im 38/38]
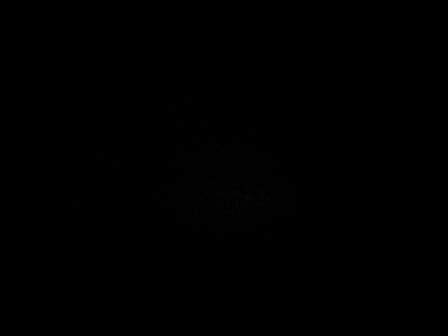

[Series 1132: T1 · sagittal · left · 4.5mm · 0.31mm/px · 6 of 40 slices shown (1 of 2)]
[im 1/40]
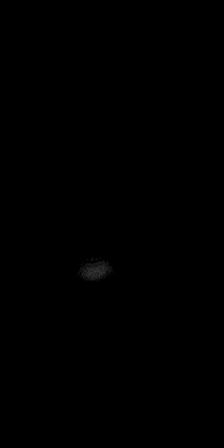
[im 8/40]
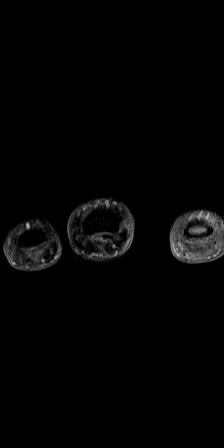
[im 16/40]
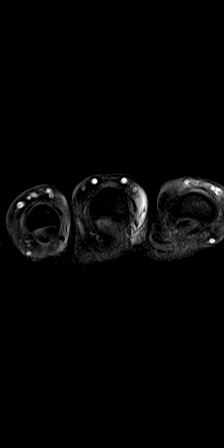
[im 24/40]
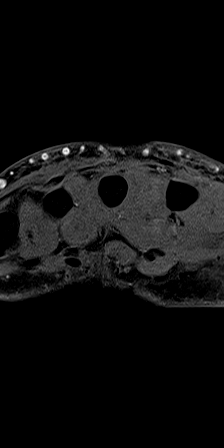
[im 32/40]
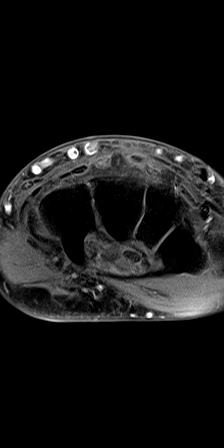
[im 40/40]
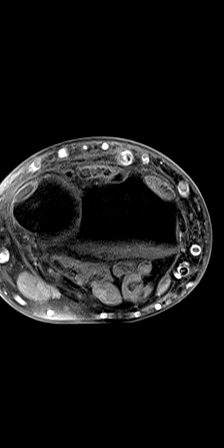

[Series 1139: T1 fat-sat · coronal · left · 3.0mm · 0.47mm/px · 3 of 20 slices shown]
[im 1/20]
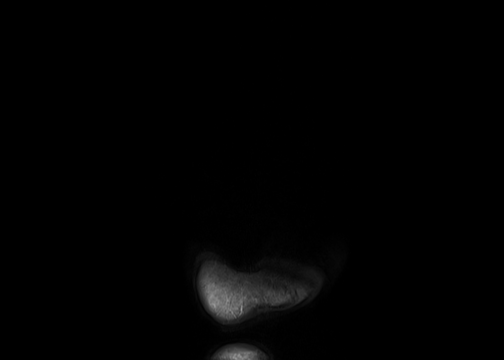
[im 10/20]
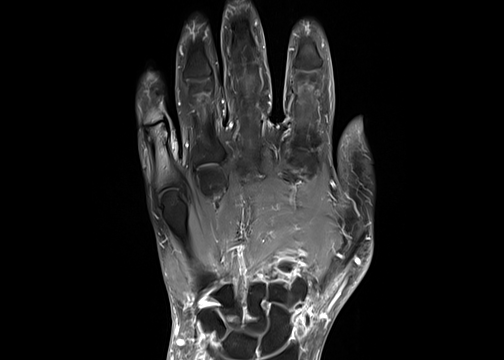
[im 20/20]
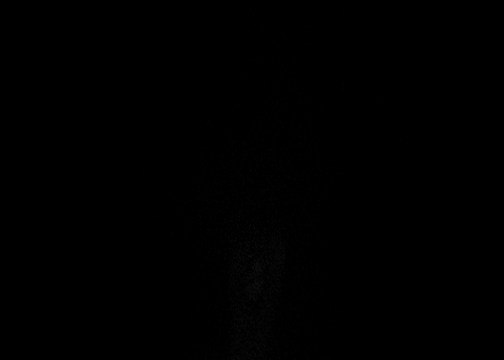

[Series 1146: T1 · sagittal · left · 4.5mm · 0.31mm/px · 6 of 40 slices shown (2 of 2)]
[im 1/40]
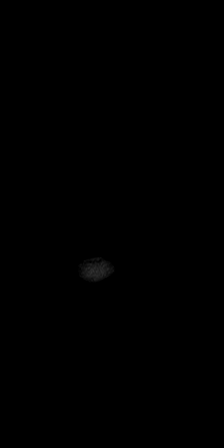
[im 8/40]
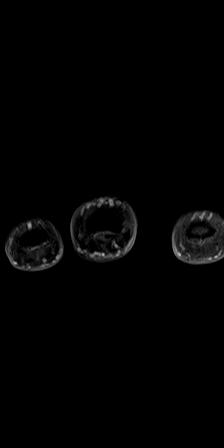
[im 16/40]
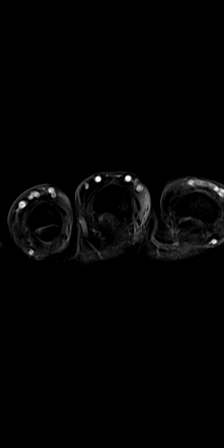
[im 24/40]
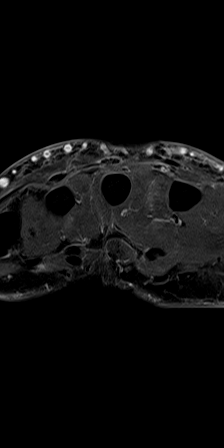
[im 32/40]
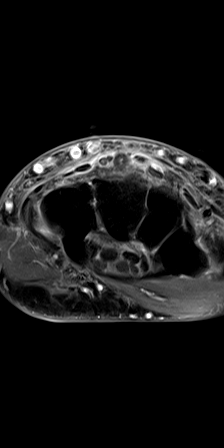
[im 40/40]
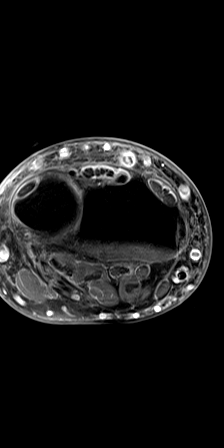

[40 of 40 positions shown; findings below may reference images not displayed]

FINDINGS: Bones/Joint/Cartilage

No findings of osteomyelitis. Borderline appearance for effusions in
the midcarpal row and radiocarpal joint, without obvious synovitis.
Mild degenerative findings of the first carpometacarpal
articulation.

Ligaments

No obvious ligamentous disruption on today's exam, which is not
focused on a specific joint.

Muscles and Tendons

Mild extensor tenosynovitis involving the second and fourth extensor
compartments.

Soft tissues

Diffuse subcutaneous edema and infiltrative enhancement tracking in
the dorsal hand and into the fingers. No drainable abscess.
IMPRESSION: 1. Infiltrative subcutaneous edema and enhancement in the wrist and
hand, extending into the fingers, with some mild associated
tenosynovitis and peritendinitis of the second and fourth extensor
compartments. Borderline accentuated fluid in the midcarpal row and
radiocarpal joint without obvious synovitis. Differential diagnostic
considerations in this setting the include cellulitis or reflex
sympathetic dystrophy. No findings of osteomyelitis at this time.

## 2020-05-03 IMAGING — MR MR WRIST*L* WO/W CM
9 series · 40 of 40 positions shown · IV contrast (gadavist)
Comparison: Radiographs [DATE]

CLINICAL DATA: Wrist pain and possible infection. The patient awoke
2 days ago with swelling which has worsened.

EXAM:
MR OF THE LEFT WRIST WITHOUT AND WITH CONTRAST
TECHNIQUE: Multiplanar multisequence MR imaging of the left wrist was performed
both before and after the administration of intravenous contrast.
CONTRAST:  7.5mL GADAVIST GADOBUTROL 1 MMOL/ML IV SOLN

[Series 1027: T2 · sagittal · left · 2.0mm · 0.47mm/px · 6 of 25 slices shown (1 of 2)]
[im 1/25]
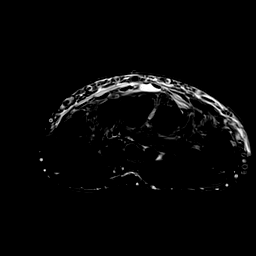
[im 5/25]
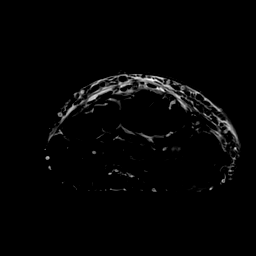
[im 10/25]
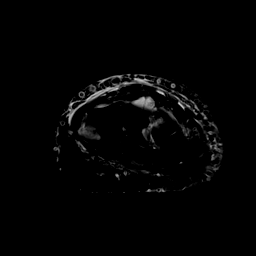
[im 15/25]
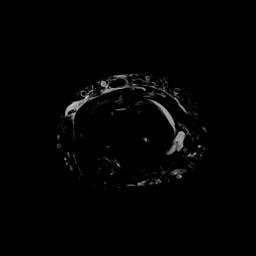
[im 20/25]
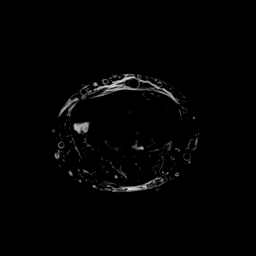
[im 25/25]
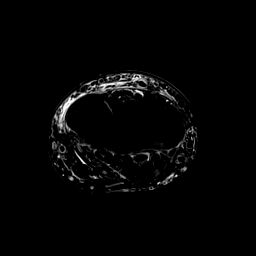

[Series 1034: ax t1_new · sagittal · left · 2.0mm · 0.47mm/px · 6 of 25 slices shown]
[im 1/25]
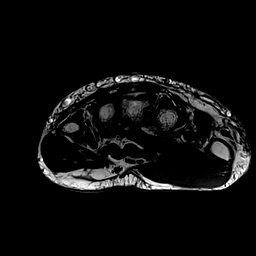
[im 5/25]
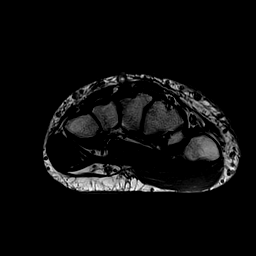
[im 10/25]
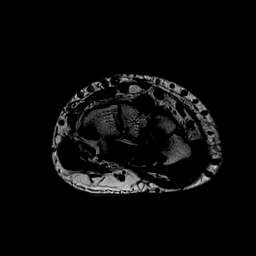
[im 15/25]
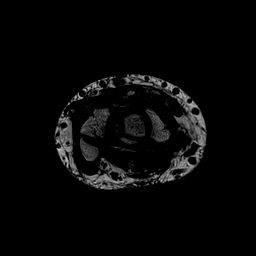
[im 20/25]
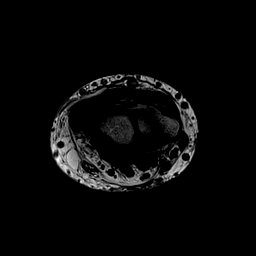
[im 25/25]
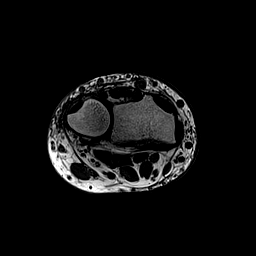

[Series 1041: T1 · coronal · left · 3.5mm · 0.47mm/px · 3 of 17 slices shown]
[im 1/17]
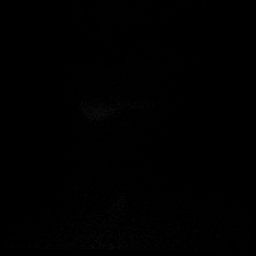
[im 9/17]
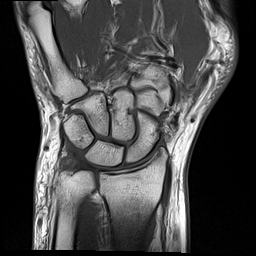
[im 17/17]
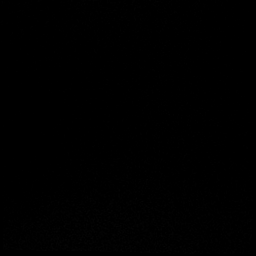

[Series 1048: T2 · coronal · left · 3.5mm · 0.47mm/px · 3 of 17 slices shown (2 of 2)]
[im 1/17]
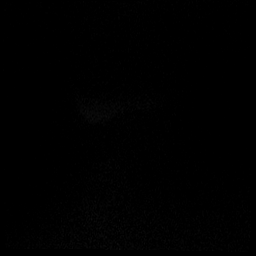
[im 9/17]
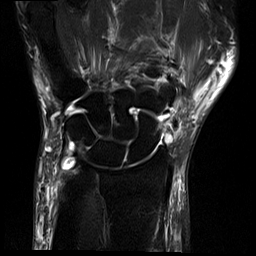
[im 17/17]
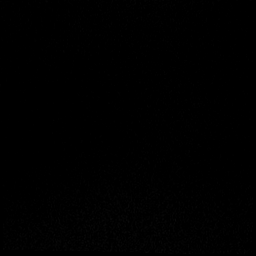

[Series 1055: PD · coronal · left · 3.5mm · 0.47mm/px · 3 of 17 slices shown (1 of 2)]
[im 1/17]
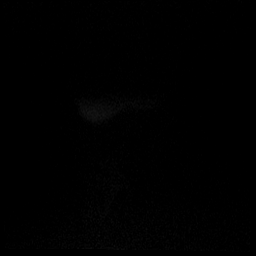
[im 9/17]
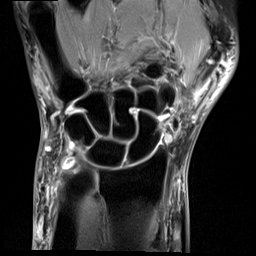
[im 17/17]
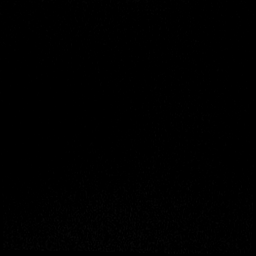

[Series 1062: PD · axial · left · 3.0mm · 0.39mm/px · z∈[-34,+49]mm · 6 of 29 slices shown (2 of 2)]
[im 1/29]
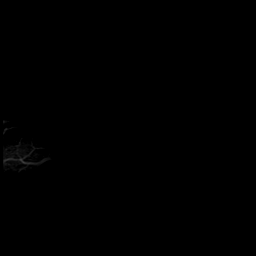
[im 6/29]
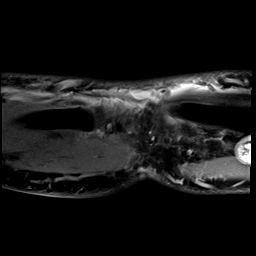
[im 12/29]
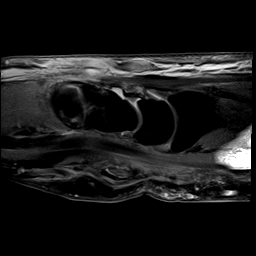
[im 17/29]
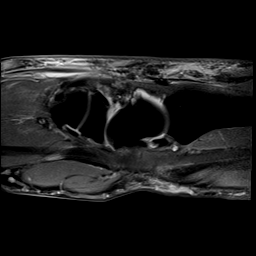
[im 23/29]
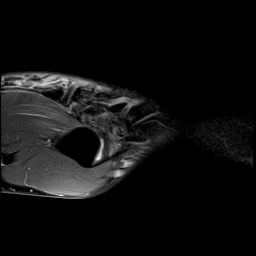
[im 29/29]
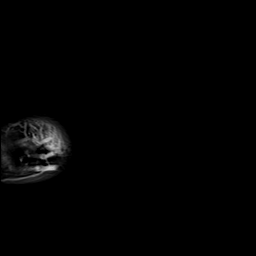

[Series 1069: T1 fat-sat · sagittal · left · 2.0mm · 0.44mm/px · 5 of 25 slices shown (1 of 3)]
[im 1/25]
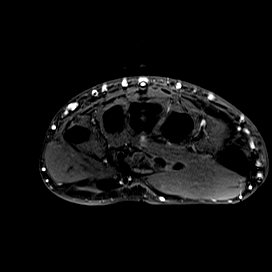
[im 7/25]
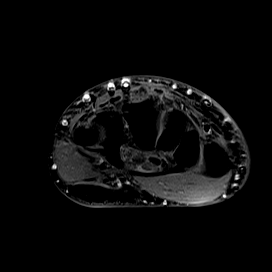
[im 13/25]
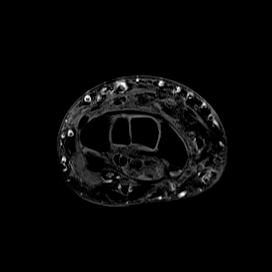
[im 19/25]
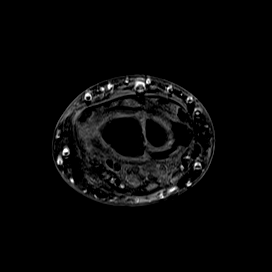
[im 25/25]
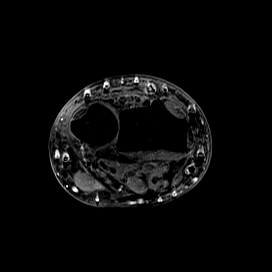

[Series 1076: T1 fat-sat · sagittal · left · 2.0mm · 0.44mm/px · 5 of 25 slices shown (2 of 3)]
[im 1/25]
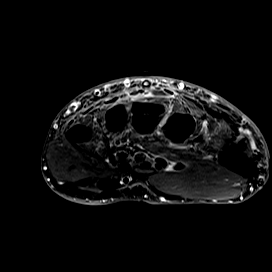
[im 7/25]
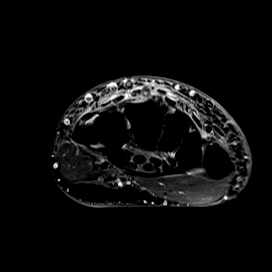
[im 13/25]
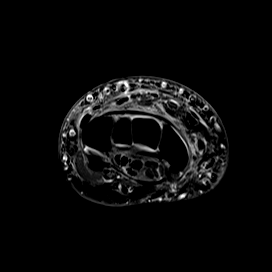
[im 19/25]
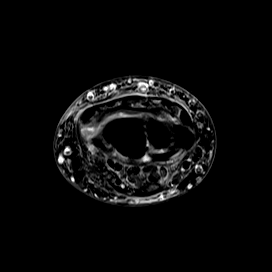
[im 25/25]
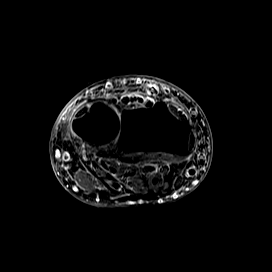

[Series 1083: T1 fat-sat · coronal · left · 3.5mm · 0.47mm/px · 3 of 17 slices shown (3 of 3)]
[im 1/17]
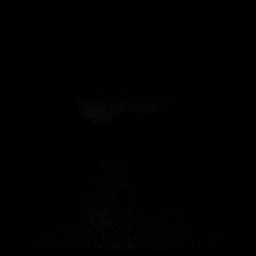
[im 9/17]
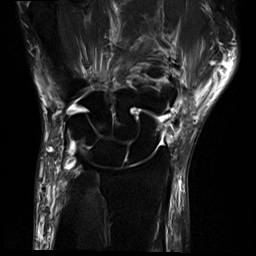
[im 17/17]
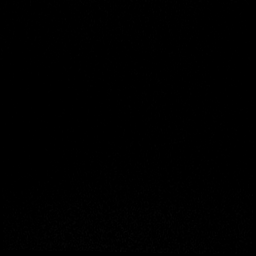

[40 of 40 positions shown; findings below may reference images not displayed]

FINDINGS: Ligaments: The intrinsic interosseous ligaments appear intact.

Triangular fibrocartilage: Unremarkable

Tendons: Mild extensor peritendinitis more notably along the second
and fourth extensor compartments and the extensor carpi ulnaris.
Trace edema along some of the flexor tendons.

Carpal tunnel/median nerve: Unremarkable

Guyon's canal: No impingement.

Joint/cartilage: Upper normal amount of fluid in the radiocarpal
joint and midcarpal joint. No overt synovitis.

Bones/carpal alignment: No findings of osteomyelitis. No fracture is
observed.

Other: Diffuse subcutaneous edema and enhancement are present and
most notable dorsally. No drainable abscess.
IMPRESSION: 1. Diffuse subcutaneous edema is present and most notable dorsally,
compatible with cellulitis or possibly reflex sympathetic dystrophy.
No drainable abscess.
2. Mild extensor peritendinitis more notably along the second and
fourth extensor compartments and the extensor carpi ulnaris. Trace
edema along some of the flexor tendons.
3. No findings of osteomyelitis.

## 2020-05-03 MED ORDER — OXYCODONE HCL 5 MG PO TABS
5.0000 mg | ORAL_TABLET | ORAL | Status: DC | PRN
  Administered 2020-05-03 – 2020-05-04 (×4): 5 mg via ORAL
  Filled 2020-05-03 (×4): qty 1

## 2020-05-03 MED ORDER — CEFAZOLIN SODIUM-DEXTROSE 1-4 GM/50ML-% IV SOLN
1.0000 g | Freq: Three times a day (TID) | INTRAVENOUS | Status: DC
  Administered 2020-05-03 – 2020-05-04 (×3): 1 g via INTRAVENOUS
  Filled 2020-05-03 (×6): qty 50

## 2020-05-03 MED ORDER — ATORVASTATIN CALCIUM 20 MG PO TABS
80.0000 mg | ORAL_TABLET | Freq: Every day | ORAL | Status: DC
  Administered 2020-05-03 – 2020-05-05 (×3): 80 mg via ORAL
  Filled 2020-05-03 (×3): qty 4

## 2020-05-03 MED ORDER — CELECOXIB 200 MG PO CAPS
200.0000 mg | ORAL_CAPSULE | Freq: Two times a day (BID) | ORAL | Status: DC
  Administered 2020-05-03 – 2020-05-04 (×2): 200 mg via ORAL
  Filled 2020-05-03 (×2): qty 1

## 2020-05-03 MED ORDER — LISINOPRIL 20 MG PO TABS
40.0000 mg | ORAL_TABLET | Freq: Every day | ORAL | Status: DC
  Administered 2020-05-03 – 2020-05-06 (×4): 40 mg via ORAL
  Filled 2020-05-03 (×5): qty 2

## 2020-05-03 MED ORDER — SODIUM CHLORIDE 0.9% FLUSH: 3.0000 mL | INTRAVENOUS | Status: DC | PRN

## 2020-05-03 MED ORDER — ONDANSETRON HCL 4 MG/2ML IJ SOLN: 4.0000 mg | Freq: Four times a day (QID) | INTRAMUSCULAR | Status: DC | PRN

## 2020-05-03 MED ORDER — MORPHINE SULFATE (PF) 2 MG/ML IV SOLN: 2.0000 mg | INTRAVENOUS | Status: DC | PRN

## 2020-05-03 MED ORDER — GADOBUTROL 1 MMOL/ML IV SOLN
7.5000 mL | Freq: Once | INTRAVENOUS | Status: AC | PRN
  Administered 2020-05-03: 7.5 mL via INTRAVENOUS

## 2020-05-03 MED ORDER — ACETAMINOPHEN 500 MG PO TABS
1000.0000 mg | ORAL_TABLET | Freq: Once | ORAL | Status: AC
  Administered 2020-05-03: 1000 mg via ORAL
  Filled 2020-05-03: qty 2

## 2020-05-03 MED ORDER — ACETAMINOPHEN 325 MG PO TABS
650.0000 mg | ORAL_TABLET | Freq: Four times a day (QID) | ORAL | Status: DC | PRN
  Administered 2020-05-04 – 2020-05-05 (×3): 650 mg via ORAL
  Filled 2020-05-03 (×3): qty 2

## 2020-05-03 MED ORDER — HYDROMORPHONE HCL 1 MG/ML IJ SOLN
1.0000 mg | Freq: Once | INTRAMUSCULAR | Status: AC
  Administered 2020-05-03: 1 mg via INTRAVENOUS

## 2020-05-03 MED ORDER — ONDANSETRON HCL 4 MG PO TABS: 4.0000 mg | ORAL_TABLET | Freq: Four times a day (QID) | ORAL | Status: DC | PRN

## 2020-05-03 MED ORDER — CLINDAMYCIN PHOSPHATE 600 MG/50ML IV SOLN
600.0000 mg | Freq: Once | INTRAVENOUS | Status: AC
  Administered 2020-05-03: 600 mg via INTRAVENOUS
  Filled 2020-05-03: qty 50

## 2020-05-03 MED ORDER — OXYCODONE HCL 5 MG PO TABS
10.0000 mg | ORAL_TABLET | Freq: Once | ORAL | Status: AC
  Administered 2020-05-03: 10 mg via ORAL
  Filled 2020-05-03: qty 2

## 2020-05-03 MED ORDER — CLOPIDOGREL BISULFATE 75 MG PO TABS
75.0000 mg | ORAL_TABLET | Freq: Every day | ORAL | Status: DC
  Administered 2020-05-03 – 2020-05-06 (×4): 75 mg via ORAL
  Filled 2020-05-03 (×4): qty 1

## 2020-05-03 MED ORDER — HYDROMORPHONE HCL 1 MG/ML IJ SOLN
0.5000 mg | Freq: Once | INTRAMUSCULAR | Status: AC
  Administered 2020-05-03: 0.5 mg via INTRAVENOUS
  Filled 2020-05-03: qty 1

## 2020-05-03 MED ORDER — SODIUM CHLORIDE 0.9% FLUSH
3.0000 mL | Freq: Two times a day (BID) | INTRAVENOUS | Status: DC
  Administered 2020-05-03 – 2020-05-06 (×7): 3 mL via INTRAVENOUS

## 2020-05-03 MED ORDER — CARVEDILOL 25 MG PO TABS
25.0000 mg | ORAL_TABLET | Freq: Two times a day (BID) | ORAL | Status: DC
  Administered 2020-05-03 – 2020-05-06 (×7): 25 mg via ORAL
  Filled 2020-05-03 (×7): qty 1

## 2020-05-03 MED ORDER — HYDROCODONE-ACETAMINOPHEN 5-325 MG PO TABS
1.0000 | ORAL_TABLET | ORAL | Status: DC | PRN
  Administered 2020-05-03: 1 via ORAL
  Filled 2020-05-03: qty 1

## 2020-05-03 MED ORDER — HYDROMORPHONE HCL 1 MG/ML IJ SOLN: 1.0000 mg | Freq: Once | INTRAMUSCULAR | Status: DC

## 2020-05-03 MED ORDER — HYDROMORPHONE HCL 1 MG/ML IJ SOLN
INTRAMUSCULAR | Status: AC
  Filled 2020-05-03: qty 1

## 2020-05-03 MED ORDER — ASPIRIN EC 81 MG PO TBEC
81.0000 mg | DELAYED_RELEASE_TABLET | Freq: Every day | ORAL | Status: DC
  Administered 2020-05-03 – 2020-05-06 (×4): 81 mg via ORAL
  Filled 2020-05-03 (×4): qty 1

## 2020-05-03 MED ORDER — SODIUM CHLORIDE 0.9 % IV SOLN
250.0000 mL | INTRAVENOUS | Status: DC | PRN
  Administered 2020-05-06 (×2): 250 mL via INTRAVENOUS

## 2020-05-03 NOTE — ED Notes (Signed)
NAD noted at this time. Pt continues to rest in bed with lights dimmed, wife remains at bedside. Pt denies any needs at this time.

## 2020-05-03 NOTE — Progress Notes (Addendum)
Contacted by Dr. Tamala Julian at [facility] ED regarding this patient.  Patient presented as likely dorsal subcutaneous abscess of the hand extending toward the wrist.  Indicated ESR 50, WBC not elevated and not systemically ill.  Indicated no evidence for flexor tenosynovitis or volar sided infection, nor any concern regarding adequate vascularity of the hand or digits.  Transfer was requested.   I indicated that a dorsal hand infection did not meet the pre-established criteria (very recently reviewed) for transfer to [facility] and was appropriate for local management by [facility] orthopaedic surgery as a general orthopaedic problem.  In addition, [facility] is presently not accepting non-trauma transfers except for our centers for excellence, such as Stroke or STEMI.   Dr. Tamala Julian indicated he would consult again with the physicians already on-call for [facility] to secure appropriate care for this patient.  Micheline Rough, MD Hand Surgery

## 2020-05-03 NOTE — ED Notes (Signed)
Pt moved from [facility] to room 5 at this time with permission from charge RN. Pt in bed, arm positioned on bedside table. Pt's wife remains at bedside. Pt denies further needs at this time.

## 2020-05-03 NOTE — ED Provider Notes (Signed)
  Patient received in signout from Dr. Cinda Quest pending callback from [facility] orthopedic hand surgeon, who he had already paged.  Briefly, 49 year old male with history of CAD on DAPT presents with atraumatic left wrist/hand pain and swelling.  ESR elevated, but otherwise benign work-up without lactic acidosis, leukocytosis or evidence systemic illness.  I speak with Dr. Grandville Silos who indicates that this patient is not appropriate for transfer.  Review our own orthopedic recommendations when Dr. Cinda Quest spoke with Dr. Mack Guise, and I order MRI of left hand and wrist to assess for evidence of subcutaneous abscess versus septic joint.  Patient provided empiric clindamycin.  The studies were performed overnight and reads of the MRIs are pending at the time of signout to oncoming provider.  Hospitalist is already been paged for admission here, and is peripherally seen the patient, but awaiting MRI reads to better guide treatment plan prior to admission.  Signed out to oncoming provider to read-page hospitalist after MRIs have been read.   Vladimir Crofts, MD 05/03/20 [phone]

## 2020-05-03 NOTE — ED Provider Notes (Signed)
[facility] Emergency Department Provider Note   ____________________________________________   First MD Initiated Contact with Patient 05/02/20 2200     (approximate)  I have reviewed the triage vital signs and the nursing notes.   HISTORY  Chief Complaint Hand Pain    HPI Edward Powell is a 49 y.o. male reports he woke up 2 mornings ago with his left hand swelling.  Is been getting worse.  He does not recall any injury.  He says he bruises easily and is and has no bruises.  Additionally his wife has looked at the hand very carefully and does not see any puncture wound.  His hand is swollen to about 3 inches below the wrist the skin is grayish in color except for on the wrist where is blotchy red he has good capillary refill in the fingertips the hand is hot very tender to touch some tenderness and moving the fingers as well.  Patient is not running a fever.  Patient is never had this before.         Past Medical History:  Diagnosis Date  . Alcohol abuse   . CAD (coronary artery disease)    a. 12/2017 NSTEMI/Cath: LM 50d, LAD 60ost, 69m, RI nl, LCX 100p, OM2 fills via collats from LAD, RCA 70p, 61m, 9m, 60d, EF 55-65%; b. 12/2017 CABG x 4: LIMA->LAD, VG->RI->LCX, VG->RPDA.  . Diastolic dysfunction    a. 12/2017 Echo: EF 55-60%, no rwma, Gr1 DD, triv MR, nl RV fxn.  . Essential hypertension   . Hyperlipidemia LDL goal <70   . Hypertriglyceridemia   . Status post primary angioplasty with coronary stent 06/15/2018   Indefinite dual antiplatelet therapy per Dec 2019 note, Dr. Kirke Corin  . Tobacco abuse     Patient Active Problem List   Diagnosis Date Noted  . Status post primary angioplasty with coronary stent 06/15/2018  . Effort angina (HCC) 06/14/2018  . Ischemic cardiomyopathy 06/14/2018  . Hyperlipidemia LDL goal <70 06/14/2018  . Obesity (BMI 30.0-34.9) 05/10/2018  . Moderate concentric left ventricular hypertrophy 05/10/2018  . Essential  hypertension 03/08/2018  . Coronary artery disease involving native heart with angina pectoris (HCC)   . 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 Unstable angina (HCC) 01/09/2018  . NSTEMI (non-ST elevated myocardial infarction) (HCC)   . Asthmatic bronchitis 01/08/2018    Past Surgical History:  Procedure Laterality Date  . CORONARY ARTERY BYPASS GRAFT N/A 01/10/2018   Procedure: CORONARY ARTERY BYPASS GRAFTING (CABG);  Surgeon: Delight Ovens, MD;  Location: [facility] OR;  Service: Open Heart Surgery;  Laterality: N/A;  Times 4 using left internal mammary artery to the LAD and endoscopically harvested left saphenous vein to OM1, OM2, and PLB  . CORONARY STENT INTERVENTION N/A 06/14/2018   Procedure: CORONARY STENT INTERVENTION;  Surgeon: Iran Ouch, MD;  Location: [facility] INVASIVE CV LAB;  Service: Cardiovascular;  Laterality: N/A;  . LEFT HEART CATH AND CORONARY ANGIOGRAPHY N/A 01/09/2018   Procedure: LEFT HEART CATH AND CORONARY ANGIOGRAPHY;  Surgeon: Yvonne Kendall, MD;  Location: [facility] INVASIVE CV LAB;  Service: Cardiovascular;  Laterality: N/A;  . LEFT HEART CATH AND CORONARY ANGIOGRAPHY N/A 05/31/2018   Procedure: LEFT HEART CATH AND CORONARY ANGIOGRAPHY;  Surgeon: Iran Ouch, MD;  Location: [facility] INVASIVE CV LAB;  Service: Cardiovascular;  Laterality: N/A;  . NO PAST SURGERIES    . TEE WITHOUT CARDIOVERSION N/A 01/10/2018   Procedure: TRANSESOPHAGEAL ECHOCARDIOGRAM (TEE);  Surgeon: Delight Ovens, MD;  Location: [facility] OR;  Service: Open Heart Surgery;  Laterality: N/A;    Prior to Admission medications   Medication Sig Start Date End Date Taking? Authorizing Provider  acetaminophen (TYLENOL) 325 MG tablet Take 2 tablets (650 mg total) by mouth every 6 (six) hours as needed for mild pain (or Fever >/= 101). 01/09/18   Alford Highland, MD  aspirin EC 81 MG EC tablet Take 1 tablet (81 mg total) by mouth daily. 01/09/18   Alford Highland, MD  atorvastatin (LIPITOR) 80 MG tablet Take 1 tablet  (80 mg total) by mouth daily at 6 PM. 01/16/20   Gollan, Tollie Pizza, MD  carvedilol (COREG) 25 MG tablet Take 1 tablet (25 mg total) by mouth 2 (two) times daily with a meal. 01/16/20   Gollan, Tollie Pizza, MD  clopidogrel (PLAVIX) 75 MG tablet Take 1 tablet (75 mg total) by mouth daily. 01/16/20   Antonieta Iba, MD  lisinopril (ZESTRIL) 40 MG tablet Take 1 tablet (40 mg total) by mouth daily. 01/16/20   Antonieta Iba, MD    Allergies Patient has no known allergies.  Family History  Problem Relation Age of Onset  . Hypertension Mother   . Breast cancer Mother   . Hyperlipidemia Mother   . Liver cancer Father   . Lung cancer Father   . Hypertension Father   . Atrial fibrillation Father   . Stroke Brother   . Hypertension Brother   . Hyperlipidemia Brother   . Heart attack Maternal Grandmother   . Stroke Maternal Grandmother   . Hyperlipidemia Maternal Grandmother   . Hypertension Maternal Grandmother   . Stroke Paternal Grandmother   . Alzheimer's disease Paternal Grandmother   . Hypertension Paternal Grandmother     Social History Social History   Tobacco Use  . Smoking status: Former Smoker    Packs/day: 1.00    Years: 30.00    Pack years: 30.00  . Smokeless tobacco: Never Used  . Tobacco comment: last cigarette was day of the heart attack  Vaping Use  . Vaping Use: Never used  Substance Use Topics  . Alcohol use: Not Currently    Alcohol/week: 9.0 standard drinks    Types: 4 Cans of beer, 5 Shots of liquor per week    Comment: up to 7-9 shots twice weekly - vodka  . Drug use: Not Currently    Review of Systems  Constitutional: No fever/chills Eyes: No visual changes. ENT: No sore throat. Cardiovascular: Denies chest pain. Respiratory: Denies shortness of breath. Gastrointestinal: No abdominal pain.  No nausea, no vomiting.  No diarrhea.  No constipation. Genitourinary: Negative for dysuria. Musculoskeletal: Negative for back pain. Skin: Negative for  rash. Neurological: Negative for headaches, focal weakness   ____________________________________________   PHYSICAL EXAM:  VITAL SIGNS: ED Triage Vitals  Enc Vitals Group     BP 05/02/20 2021 (!) 222/129     Pulse Rate 05/02/20 2021 95     Resp 05/02/20 2021 (!) 22     Temp 05/02/20 2021 98.1 F (36.7 C)     Temp Source 05/02/20 2021 Oral     SpO2 05/02/20 2021 98 %     Weight 05/02/20 2022 210 lb (95.3 kg)     Height 05/02/20 2022 5\' 8"  (1.727 m)     Head Circumference --      Peak Flow --      Pain Score 05/02/20 2021 9     Pain Loc --  Pain Edu? --      Excl. in GC? --     Constitutional: Alert and oriented. Well appearing and in no acute distress. Eyes: Conjunctivae are normal.  Head: Atraumatic. Nose: No congestion/rhinnorhea. Mouth/Throat: Mucous membranes are moist.  Oropharynx non-erythematous. Neck: No stridor.   Cardiovascular:   Good peripheral circulation. Respiratory: Normal respiratory effort.  No retractions.  Gastrointestinal: Soft and nontender. No distention. No abdominal bruits.  Musculoskeletal: No lower extremity tenderness nor edema.  Right hand is normal left hand as described in HPI. Neurologic:  Normal speech and language.  Skin:  Skin is warm, dry and intact except in left hand as described. No rash noted.   ____________________________________________   LABS (all labs ordered are listed, but only abnormal results are displayed)  Labs Reviewed  CBC - Abnormal; Notable for the following components:      Result Value   RBC 4.20 (*)    All other components within normal limits  COMPREHENSIVE METABOLIC PANEL - Abnormal; Notable for the following components:   Potassium 3.4 (*)    Glucose, Bld 172 (*)    BUN 24 (*)    Total Protein 8.2 (*)    ALT 54 (*)    All other components within normal limits  SEDIMENTATION RATE - Abnormal; Notable for the following components:   Sed Rate 48 (*)    All other components within normal limits    CULTURE, BLOOD (ROUTINE X 2)  CULTURE, BLOOD (ROUTINE X 2)  LACTIC ACID, PLASMA  LACTIC ACID, PLASMA  CK  C-REACTIVE PROTEIN   ____________________________________________  EKG   ____________________________________________  RADIOLOGY Jill PolingI, Salinda Snedeker,  Stetson Pelaez F, personally viewed and evaluated these images (plain radiographs) as part of my medical decision making, as well as reviewing the written report by the radiologist.  Hand x-ray read by radiology reviewed by me is negative except for swelling  Official radiology report(s): DG Hand Complete Left  Result Date: 05/02/2020 CLINICAL DATA:  49 year old male with left hand pain and swelling. EXAM: LEFT HAND - COMPLETE 3+ VIEW COMPARISON:  None. FINDINGS: There is no acute fracture or dislocation. The bones are well mineralized. No significant arthritic changes. There is diffuse soft tissue swelling of the hand. No radiopaque foreign object or soft tissue gas. IMPRESSION: 1. No acute osseous pathology. 2. Mild diffuse soft tissue swelling. Electronically Signed   By: Elgie CollardArash  Radparvar M.D.   On: 05/02/2020 21:30    ____________________________________________   PROCEDURES  Procedure(s) performed (including Critical Care): Critical care time half an hour.  This includes discussing the patient with orthopedics Dr. Martha ClanKrasinski and vascular surgeon Dr. Evie LacksEsco.  Dr. Martha ClanKrasinski suggested an MRI to evaluate the hand Dr. Mena GoesEskridge suggested hand surgery.  Discussed with patient we do not have hand surgery here today [facility] and I believe they do have hand surgery there he would like to go there.  Procedures   ____________________________________________   INITIAL IMPRESSION / ASSESSMENT AND PLAN / ED COURSE  I am uncertain as to the etiology of the patient's pain and swelling but it is very painful he is gotten doses of IV Dilaudid and the pain is still present.  His hand is still swollen in spite of icing it and elevating it.  His  sed rate is 48 which is high for 49 year old white count is normal.  Patient does have a radial pulse he does have good capillary refill in his fingertips and his nailbeds.  Hand is still hot swollen  with gray looking skin.  I am frankly mostly worried by the color of the skin.  Both Dr. Martha Clan and Dr. Evie Lacks are worried about the blood flow there.  I am attempting to contact hand surgeon at [facility] currently. ----------------------------------------- 1:04 AM on 05/03/2020 -----------------------------------------  We are waiting for the hand surgeon to call back from [facility].  I am signing the patient out to oncoming provider         Clinical Course as of May 03 33  Sat May 02, 2020  2353 CK [PM]    Clinical Course User Index [PM] Arnaldo Natal, MD     ____________________________________________   FINAL CLINICAL IMPRESSION(S) / ED DIAGNOSES  Final diagnoses:  Left hand pain     ED Discharge Orders    None      *Please note:  Edward Powell was evaluated in Emergency Department on 05/03/2020 for the symptoms described in the history of present illness. He was evaluated in the context of the global COVID-19 pandemic, which necessitated consideration that the patient might be at risk for infection with the SARS-CoV-2 virus that causes COVID-19. Institutional protocols and algorithms that pertain to the evaluation of patients at risk for COVID-19 are in a state of rapid change based on information released by regulatory bodies including the CDC and federal and state organizations. These policies and algorithms were followed during the patient's care in the ED.  Some ED evaluations and interventions may be delayed as a result of limited staffing during and the pandemic.*   Note:  This document was prepared using Dragon voice recognition software and may include unintentional dictation errors.    Arnaldo Natal, MD 05/03/20 [phone]

## 2020-05-03 NOTE — Consult Note (Signed)
ORTHOPAEDIC CONSULTATION  REQUESTING PHYSICIAN: Lucile Shutters, MD  Chief Complaint: Left hand swelling  HPI: Edward Powell is a 49 y.o. male who complains of left hand and wrist swelling, pain and erythema that began when he woke up Friday morning.  Patient is right-hand dominant and works in Consulting civil engineer.  He denies any recent work injury.  He states he does have a cat at home which does occasionally scratch and bite him but he does not recall any specific incident with a cat recently.  Patient today states that he is feeling slightly better.  He still has pain over the dorsal hand and wrist.  Patient's wife is at the bedside.  Past Medical History:  Diagnosis Date  . Alcohol abuse   . CAD (coronary artery disease)    a. 12/2017 NSTEMI/Cath: LM 50d, LAD 60ost, 51m, RI nl, LCX 100p, OM2 fills via collats from LAD, RCA 70p, 52m, 55m, 60d, EF 55-65%; b. 12/2017 CABG x 4: LIMA->LAD, VG->RI->LCX, VG->RPDA.  . Diastolic dysfunction    a. 12/2017 Echo: EF 55-60%, no rwma, Gr1 DD, triv MR, nl RV fxn.  . Essential hypertension   . Hyperlipidemia LDL goal <70   . Hypertriglyceridemia   . Status post primary angioplasty with coronary stent 06/15/2018   Indefinite dual antiplatelet therapy per Dec 2019 note, Dr. Kirke Corin  . Tobacco abuse    Past Surgical History:  Procedure Laterality Date  . CORONARY ARTERY BYPASS GRAFT N/A 01/10/2018   Procedure: CORONARY ARTERY BYPASS GRAFTING (CABG);  Surgeon: Delight Ovens, MD;  Location: [facility] OR;  Service: Open Heart Surgery;  Laterality: N/A;  Times 4 using left internal mammary artery to the LAD and endoscopically harvested left saphenous vein to OM1, OM2, and PLB  . CORONARY STENT INTERVENTION N/A 06/14/2018   Procedure: CORONARY STENT INTERVENTION;  Surgeon: Iran Ouch, MD;  Location: [facility] INVASIVE CV LAB;  Service: Cardiovascular;  Laterality: N/A;  . LEFT HEART CATH AND CORONARY ANGIOGRAPHY N/A 01/09/2018   Procedure: LEFT HEART CATH AND CORONARY  ANGIOGRAPHY;  Surgeon: Yvonne Kendall, MD;  Location: [facility] INVASIVE CV LAB;  Service: Cardiovascular;  Laterality: N/A;  . LEFT HEART CATH AND CORONARY ANGIOGRAPHY N/A 05/31/2018   Procedure: LEFT HEART CATH AND CORONARY ANGIOGRAPHY;  Surgeon: Iran Ouch, MD;  Location: [facility] INVASIVE CV LAB;  Service: Cardiovascular;  Laterality: N/A;  . NO PAST SURGERIES    . TEE WITHOUT CARDIOVERSION N/A 01/10/2018   Procedure: TRANSESOPHAGEAL ECHOCARDIOGRAM (TEE);  Surgeon: Delight Ovens, MD;  Location: [facility] OR;  Service: Open Heart Surgery;  Laterality: N/A;   Social History   Socioeconomic History  . Marital status: Single    Spouse name: Not on file  . Number of children: Not on file  . Years of education: 12  . Highest education level: Some college, no degree  Occupational History  . Not on file  Tobacco Use  . Smoking status: Former Smoker    Packs/day: 1.00    Years: 30.00    Pack years: 30.00  . Smokeless tobacco: Never Used  . Tobacco comment: last cigarette was day of the heart attack  Vaping Use  . Vaping Use: Never used  Substance and Sexual Activity  . Alcohol use: Not Currently    Alcohol/week: 9.0 standard drinks    Types: 4 Cans of beer, 5 Shots of liquor per week    Comment: up to 7-9 shots twice weekly - vodka  . Drug use: Not Currently  . Sexual activity:  Not on file  Other Topics Concern  . Not on file  Social History Narrative   Lives in [place] with significant other.  No children.  Does not routinely exercise.   Social Determinants of Health   Financial Resource Strain:   . Difficulty of Paying Living Expenses: Not on file  Food Insecurity:   . Worried About Programme researcher, broadcasting/film/video in the Last Year: Not on file  . Ran Out of Food in the Last Year: Not on file  Transportation Needs:   . Lack of Transportation (Medical): Not on file  . Lack of Transportation (Non-Medical): Not on file  Physical Activity:   . Days of Exercise per Week: Not on file  .  Minutes of Exercise per Session: Not on file  Stress:   . Feeling of Stress : Not on file  Social Connections:   . Frequency of Communication with Friends and Family: Not on file  . Frequency of Social Gatherings with Friends and Family: Not on file  . Attends Religious Services: Not on file  . Active Member of Clubs or Organizations: Not on file  . Attends Banker Meetings: Not on file  . Marital Status: Not on file   Family History  Problem Relation Age of Onset  . Hypertension Mother   . Breast cancer Mother   . Hyperlipidemia Mother   . Liver cancer Father   . Lung cancer Father   . Hypertension Father   . Atrial fibrillation Father   . Stroke Brother   . Hypertension Brother   . Hyperlipidemia Brother   . Heart attack Maternal Grandmother   . Stroke Maternal Grandmother   . Hyperlipidemia Maternal Grandmother   . Hypertension Maternal Grandmother   . Stroke Paternal Grandmother   . Alzheimer's disease Paternal Grandmother   . Hypertension Paternal Grandmother    No Known Allergies Prior to Admission medications   Medication Sig Start Date End Date Taking? Authorizing Provider  acetaminophen (TYLENOL) 325 MG tablet Take 2 tablets (650 mg total) by mouth every 6 (six) hours as needed for mild pain (or Fever >/= 101). 01/09/18  Yes Wieting, Richard, MD  aspirin EC 81 MG EC tablet Take 1 tablet (81 mg total) by mouth daily. 01/09/18  Yes Alford Highland, MD  atorvastatin (LIPITOR) 80 MG tablet Take 1 tablet (80 mg total) by mouth daily at 6 PM. 01/16/20  Yes Gollan, Tollie Pizza, MD  carvedilol (COREG) 25 MG tablet Take 1 tablet (25 mg total) by mouth 2 (two) times daily with a meal. 01/16/20  Yes Gollan, Tollie Pizza, MD  clopidogrel (PLAVIX) 75 MG tablet Take 1 tablet (75 mg total) by mouth daily. 01/16/20  Yes Antonieta Iba, MD  lisinopril (ZESTRIL) 40 MG tablet Take 1 tablet (40 mg total) by mouth daily. 01/16/20  Yes Antonieta Iba, MD   MR HAND LEFT W WO  CONTRAST  Result Date: 05/03/2020 CLINICAL DATA:  Left hand swelling starting 2 days ago. EXAM: MRI OF THE LEFT HAND WITHOUT AND WITH CONTRAST TECHNIQUE: Multiplanar, multisequence MR imaging of the left hand was performed before and after the administration of intravenous contrast. Axial images seem focused on the middle finger. Coronal images exclude the distal phalanges of the index, middle, and ring finger. CONTRAST:  7.32mL GADAVIST GADOBUTROL 1 MMOL/ML IV SOLN COMPARISON:  05/02/2020 FINDINGS: Bones/Joint/Cartilage No findings of osteomyelitis. Borderline appearance for effusions in the midcarpal row and radiocarpal joint, without obvious synovitis. Mild degenerative findings of the  first carpometacarpal articulation. Ligaments No obvious ligamentous disruption on today's exam, which is not focused on a specific joint. Muscles and Tendons Mild extensor tenosynovitis involving the second and fourth extensor compartments. Soft tissues Diffuse subcutaneous edema and infiltrative enhancement tracking in the dorsal hand and into the fingers. No drainable abscess. IMPRESSION: 1. Infiltrative subcutaneous edema and enhancement in the wrist and hand, extending into the fingers, with some mild associated tenosynovitis and peritendinitis of the second and fourth extensor compartments. Borderline accentuated fluid in the midcarpal row and radiocarpal joint without obvious synovitis. Differential diagnostic considerations in this setting the include cellulitis or reflex sympathetic dystrophy. No findings of osteomyelitis at this time. Electronically Signed   By: Gaylyn Rong M.D.   On: 05/03/2020 08:24   MR WRIST LEFT W WO CONTRAST  Result Date: 05/03/2020 CLINICAL DATA:  Wrist pain and possible infection. The patient awoke 2 days ago with swelling which has worsened. EXAM: MR OF THE LEFT WRIST WITHOUT AND WITH CONTRAST TECHNIQUE: Multiplanar multisequence MR imaging of the left wrist was performed both  before and after the administration of intravenous contrast. CONTRAST:  7.43mL GADAVIST GADOBUTROL 1 MMOL/ML IV SOLN COMPARISON:  Radiographs 05/03/2019 FINDINGS: Ligaments: The intrinsic interosseous ligaments appear intact. Triangular fibrocartilage: Unremarkable Tendons: Mild extensor peritendinitis more notably along the second and fourth extensor compartments and the extensor carpi ulnaris. Trace edema along some of the flexor tendons. Carpal tunnel/median nerve: Unremarkable Guyon's canal: No impingement. Joint/cartilage: Upper normal amount of fluid in the radiocarpal joint and midcarpal joint. No overt synovitis. Bones/carpal alignment: No findings of osteomyelitis. No fracture is observed. Other: Diffuse subcutaneous edema and enhancement are present and most notable dorsally. No drainable abscess. IMPRESSION: 1. Diffuse subcutaneous edema is present and most notable dorsally, compatible with cellulitis or possibly reflex sympathetic dystrophy. No drainable abscess. 2. Mild extensor peritendinitis more notably along the second and fourth extensor compartments and the extensor carpi ulnaris. Trace edema along some of the flexor tendons. 3. No findings of osteomyelitis. Electronically Signed   By: Gaylyn Rong M.D.   On: 05/03/2020 08:28   DG Hand Complete Left  Result Date: 05/02/2020 CLINICAL DATA:  49 year old male with left hand pain and swelling. EXAM: LEFT HAND - COMPLETE 3+ VIEW COMPARISON:  None. FINDINGS: There is no acute fracture or dislocation. The bones are well mineralized. No significant arthritic changes. There is diffuse soft tissue swelling of the hand. No radiopaque foreign object or soft tissue gas. IMPRESSION: 1. No acute osseous pathology. 2. Mild diffuse soft tissue swelling. Electronically Signed   By: Elgie Collard M.D.   On: 05/02/2020 21:30    Positive ROS: All other systems have been reviewed and were otherwise negative with the exception of those mentioned in the  HPI and as above.  Physical Exam: General: Alert, no acute distress  MUSCULOSKELETAL: Left hand: Patient has intact skin.  There is dorsal hand and wrist swelling with mild erythema.  He has tenderness to palpation over the dorsum of his hand and wrist.  He does not have a sending erythema.  He can flex and extend his digits without significant pain but lacks full extension and flexion due to his swelling.  Patient's finger is well-perfused.  He has a palpable radial pulse.  He has intact sensation light touch in his digits.  Patient can gently flex and extend his wrist approximately 45 degrees without severe pain.   Assessment: Left dorsal hand/wrist cellulitis versus extensor tendinitis  Plan: I reviewed the patient's MRI.  There is no focal collection to suggest an abscess that could be drained.  Patient has generalized soft tissue edema in the dorsal hand and wrist with some mild associated tenosynovitis and peritendinitis of the second and fourth extensor compartments.  There is no evidence of osteomyelitis.  At this time based on the patient's clinical improvement and lack of focal collection on MRI, there is no pain for surgery.  I expect the patient will continue to improve on IV antibiotics as I expect he likely has some cellulitis versus extensor tendinitis.  Going to add Celebrex to his medications.  Patient is on Plavix and is not a candidate for other anti-inflammatories.  Patient will continue on IV clindamycin and Ancef.     Juanell FairlyKevin Montrail Mehrer, MD    05/03/2020 5:49 PM

## 2020-05-03 NOTE — ED Notes (Signed)
Admitting MD at bedside at this time.

## 2020-05-03 NOTE — ED Notes (Signed)
Message sent to pharmacy regarding verifying medications.

## 2020-05-03 NOTE — ED Notes (Signed)
Pt continues to sit up in bed and eat breakfast. NAD noted at this time. Pt's wife remains at bedside. Pharmacy tech at bedside to do med reconciliation.

## 2020-05-03 NOTE — H&P (Signed)
History and Physical    Edward SpeckingDarin W Powell WUJ:[id number] DOB: [birth year] DOA: 05/02/2020  PCP: [name], [name], MD   Patient coming from: Home  I have personally briefly reviewed patient's [facility]  Chief Complaint: Left hand pain and swelling  HPI: Edward SpeckingDarin W Powell is a 49 y.o. male with medical history significant for coronary artery disease, hypertension and dyslipidemia who presents to the ER for evaluation of a 3-day history of pain and swelling involving his left hand.  Patient states he woke up on Friday morning, 11/12 and noticed that his left hand was swollen and red.  It has progressively worsened over the last couple of days with increasing pain, redness and differential warmth.  He denies having any fever or chills.  He has no chest pain, shortness of breath, nausea, vomiting, dizziness, lightheadedness, abdominal pain or any changes in his bowel habits. Labs show sodium 136, potassium 3.4, chloride 113, bicarb 23, glucose 172, BUN 25, creatinine 0.97, calcium 9.4, alkaline phosphatase 99, albumin 4.4, AST 29, ALT 54, total protein 8.2, CRP 2.3, lactic acid 1.4, white count 9.3, hemoglobin 13.6 MCV 93.8, RDW 12.9, platelet count 206, sed rate 48 MRI of the left wrist with and without contrast showed diffuse subcutaneous edema is present and most notable dorsally, compatible with cellulitis or possibly reflex sympathetic dystrophy. No drainable abscess. Mild extensor peritendinitis more notably along the second and fourth extensor compartments and the extensor carpi ulnaris. Trace edema along some of the flexor tendons. No findings of osteomyelitis. MRI of the left hand showed infiltrative subcutaneous edema and enhancement in the wrist and hand, extending into the fingers, with some mild associated tenosynovitis and peritendinitis of the second and fourth extensor compartments. Borderline accentuated fluid in the midcarpal row and radiocarpal joint without  obvious synovitis. Differential diagnostic considerations in this setting the include cellulitis or reflex sympathetic dystrophy. No findings of osteomyelitis at this time. Left hand x-ray shows no acute osseous pathology. Mild diffuse soft tissue swelling. Respiratory viral panel is negative     ED Course: Patient is a 49 year old Caucasian male who presents to the ER for evaluation of left hand pain and swelling with redness and differential warmth.  Imaging suggestive of cellulitis and patient will be admitted to the hospital for IV antibiotic therapy.  Orthopedic surgery has been consulted.  Review of Systems: As per HPI otherwise 10 point review of systems negative.    Past Medical History:  Diagnosis Date  . Alcohol abuse   . CAD (coronary artery disease)    a. 12/2017 NSTEMI/Cath: LM 50d, LAD 60ost, 4561m, RI nl, LCX 100p, OM2 fills via collats from LAD, RCA 70p, 3886m, 4519m, 60d, EF 55-65%; b. 12/2017 CABG x 4: LIMA->LAD, VG->RI->LCX, VG->RPDA.  . Diastolic dysfunction    a. 12/2017 Echo: EF 55-60%, no rwma, Gr1 DD, triv MR, nl RV fxn.  . Essential hypertension   . Hyperlipidemia LDL goal <70   . Hypertriglyceridemia   . Status post primary angioplasty with coronary stent 06/15/2018   Indefinite dual antiplatelet therapy per Dec 2019 note, Dr. Kirke CorinArida  . Tobacco abuse     Past Surgical History:  Procedure Laterality Date  . CORONARY ARTERY BYPASS GRAFT N/A 01/10/2018   Procedure: CORONARY ARTERY BYPASS GRAFTING (CABG);  Surgeon: Delight OvensGerhardt, Edward B, MD;  [facility];  Service: Open Heart Surgery;  Laterality: N/A;  Times 4 using left internal mammary artery to the LAD and endoscopically harvested left saphenous vein to  OM1, OM2, and PLB  . CORONARY STENT INTERVENTION N/A 06/14/2018   Procedure: CORONARY STENT INTERVENTION;  Surgeon: Iran Ouch, MD;  Location: [facility] INVASIVE CV LAB;  Service: Cardiovascular;  Laterality: N/A;  . LEFT HEART CATH AND CORONARY ANGIOGRAPHY N/A  01/09/2018   Procedure: LEFT HEART CATH AND CORONARY ANGIOGRAPHY;  Surgeon: Yvonne Kendall, MD;  Location: [facility] INVASIVE CV LAB;  Service: Cardiovascular;  Laterality: N/A;  . LEFT HEART CATH AND CORONARY ANGIOGRAPHY N/A 05/31/2018   Procedure: LEFT HEART CATH AND CORONARY ANGIOGRAPHY;  Surgeon: Iran Ouch, MD;  Location: [facility] INVASIVE CV LAB;  Service: Cardiovascular;  Laterality: N/A;  . NO PAST SURGERIES    . TEE WITHOUT CARDIOVERSION N/A 01/10/2018   Procedure: TRANSESOPHAGEAL ECHOCARDIOGRAM (TEE);  Surgeon: Delight Ovens, MD;  Location: [facility] OR;  Service: Open Heart Surgery;  Laterality: N/A;     reports that he has quit smoking. He has a 30.00 pack-year smoking history. He has never used smokeless tobacco. He reports previous alcohol use of about 9.0 standard drinks of alcohol per week. He reports previous drug use.  No Known Allergies  Family History  Problem Relation Age of Onset  . Hypertension Mother   . Breast cancer Mother   . Hyperlipidemia Mother   . Liver cancer Father   . Lung cancer Father   . Hypertension Father   . Atrial fibrillation Father   . Stroke Brother   . Hypertension Brother   . Hyperlipidemia Brother   . Heart attack Maternal Grandmother   . Stroke Maternal Grandmother   . Hyperlipidemia Maternal Grandmother   . Hypertension Maternal Grandmother   . Stroke Paternal Grandmother   . Alzheimer's disease Paternal Grandmother   . Hypertension Paternal Grandmother      Prior to Admission medications   Medication Sig Start Date End Date Taking? Authorizing Provider  acetaminophen (TYLENOL) 325 MG tablet Take 2 tablets (650 mg total) by mouth every 6 (six) hours as needed for mild pain (or Fever >/= 101). 01/09/18   Alford Highland, MD  aspirin EC 81 MG EC tablet Take 1 tablet (81 mg total) by mouth daily. 01/09/18   Alford Highland, MD  atorvastatin (LIPITOR) 80 MG tablet Take 1 tablet (80 mg total) by mouth daily at 6 PM. 01/16/20   Gollan,  Tollie Pizza, MD  carvedilol (COREG) 25 MG tablet Take 1 tablet (25 mg total) by mouth 2 (two) times daily with a meal. 01/16/20   Gollan, Tollie Pizza, MD  clopidogrel (PLAVIX) 75 MG tablet Take 1 tablet (75 mg total) by mouth daily. 01/16/20   Antonieta Iba, MD  lisinopril (ZESTRIL) 40 MG tablet Take 1 tablet (40 mg total) by mouth daily. 01/16/20   Antonieta Iba, MD    Physical Exam: Vitals:   05/03/20 0110 05/03/20 0438 05/03/20 0709 05/03/20 0930  BP: 133/77 (!) 135/97 (!) 157/107 (!) 163/98  Pulse: 84  85 66  Resp: 16  20 14   Temp:      TempSrc:      SpO2: 99% 96% 96% 96%  Weight:      Height:         Vitals:   05/03/20 0110 05/03/20 0438 05/03/20 0709 05/03/20 0930  BP: 133/77 (!) 135/97 (!) 157/107 (!) 163/98  Pulse: 84  85 66  Resp: 16  20 14   Temp:      TempSrc:      SpO2: 99% 96% 96% 96%  Weight:  Height:        Constitutional: NAD, alert and oriented x 3 Eyes: PERRL, lids and conjunctivae normal ENMT: Mucous membranes are moist.  Neck: normal, supple, no masses, no thyromegaly Respiratory: clear to auscultation bilaterally, no wheezing, no crackles. Normal respiratory effort. No accessory muscle use. Cardiovascular: Regular rate and rhythm,no murmurs / rubs / gallops. No extremity edema. 2+ pedal pulses. No carotid bruits.  Abdomen: no tenderness, no masses palpated. No hepatosplenomegaly. Bowel sounds positive.  Musculoskeletal: no clubbing / cyanosis.  Swelling involving left hand, fingers and wrist, redness and differential warmth involving the left hand and wrist, palpable radial pulse, able to move fingers Skin: no rashes, lesions, ulcers.  Neurologic: No gross focal neurologic deficit. Psychiatric: Normal mood and affect.   Labs on Admission: I have personally reviewed following labs and imaging studies  CBC: Recent Labs  Lab 05/02/20 2027  WBC 9.3  HGB 13.6  HCT 39.4  MCV 93.8  PLT 206   Basic Metabolic Panel: Recent Labs  Lab  05/02/20 2027  NA 136  K 3.4*  CL 103  CO2 23  GLUCOSE 172*  BUN 24*  CREATININE 0.97  CALCIUM 9.4   GFR: Estimated Creatinine Clearance: 102.1 mL/min (by C-G formula based on SCr of 0.97 mg/dL). Liver Function Tests: Recent Labs  Lab 05/02/20 2027  AST 29  ALT 54*  ALKPHOS 99  BILITOT 0.9  PROT 8.2*  ALBUMIN 4.4   No results for input(s): LIPASE, AMYLASE in the last 168 hours. No results for input(s): AMMONIA in the last 168 hours. Coagulation Profile: No results for input(s): INR, PROTIME in the last 168 hours. Cardiac Enzymes: Recent Labs  Lab 05/02/20 2359  CKTOTAL 80   BNP (last 3 results) No results for input(s): PROBNP in the last 8760 hours. HbA1C: No results for input(s): HGBA1C in the last 72 hours. CBG: No results for input(s): GLUCAP in the last 168 hours. Lipid Profile: No results for input(s): CHOL, HDL, LDLCALC, TRIG, CHOLHDL, LDLDIRECT in the last 72 hours. Thyroid Function Tests: No results for input(s): TSH, T4TOTAL, FREET4, T3FREE, THYROIDAB in the last 72 hours. Anemia Panel: No results for input(s): VITAMINB12, FOLATE, FERRITIN, TIBC, IRON, RETICCTPCT in the last 72 hours. Urine analysis:    Component Value Date/Time   COLORURINE YELLOW 01/10/2018 0500   APPEARANCEUR CLEAR 01/10/2018 0500   LABSPEC 1.023 01/10/2018 0500   PHURINE 5.0 01/10/2018 0500   GLUCOSEU NEGATIVE 01/10/2018 0500   HGBUR NEGATIVE 01/10/2018 0500   BILIRUBINUR NEGATIVE 01/10/2018 0500   KETONESUR NEGATIVE 01/10/2018 0500   PROTEINUR NEGATIVE 01/10/2018 0500   NITRITE NEGATIVE 01/10/2018 0500   LEUKOCYTESUR NEGATIVE 01/10/2018 0500    Radiological Exams on Admission: MR HAND LEFT W WO CONTRAST  Result Date: 05/03/2020 CLINICAL DATA:  Left hand swelling starting 2 days ago. EXAM: MRI OF THE LEFT HAND WITHOUT AND WITH CONTRAST TECHNIQUE: Multiplanar, multisequence MR imaging of the left hand was performed before and after the administration of intravenous  contrast. Axial images seem focused on the middle finger. Coronal images exclude the distal phalanges of the index, middle, and ring finger. CONTRAST:  7.22mL GADAVIST GADOBUTROL 1 MMOL/ML IV SOLN COMPARISON:  05/02/2020 FINDINGS: Bones/Joint/Cartilage No findings of osteomyelitis. Borderline appearance for effusions in the midcarpal row and radiocarpal joint, without obvious synovitis. Mild degenerative findings of the first carpometacarpal articulation. Ligaments No obvious ligamentous disruption on today's exam, which is not focused on a specific joint. Muscles and Tendons Mild extensor tenosynovitis involving the second and  fourth extensor compartments. Soft tissues Diffuse subcutaneous edema and infiltrative enhancement tracking in the dorsal hand and into the fingers. No drainable abscess. IMPRESSION: 1. Infiltrative subcutaneous edema and enhancement in the wrist and hand, extending into the fingers, with some mild associated tenosynovitis and peritendinitis of the second and fourth extensor compartments. Borderline accentuated fluid in the midcarpal row and radiocarpal joint without obvious synovitis. Differential diagnostic considerations in this setting the include cellulitis or reflex sympathetic dystrophy. No findings of osteomyelitis at this time. Electronically Signed   By: Gaylyn Rong M.D.   On: 05/03/2020 08:24   MR WRIST LEFT W WO CONTRAST  Result Date: 05/03/2020 CLINICAL DATA:  Wrist pain and possible infection. The patient awoke 2 days ago with swelling which has worsened. EXAM: MR OF THE LEFT WRIST WITHOUT AND WITH CONTRAST TECHNIQUE: Multiplanar multisequence MR imaging of the left wrist was performed both before and after the administration of intravenous contrast. CONTRAST:  7.34mL GADAVIST GADOBUTROL 1 MMOL/ML IV SOLN COMPARISON:  Radiographs 05/03/2019 FINDINGS: Ligaments: The intrinsic interosseous ligaments appear intact. Triangular fibrocartilage: Unremarkable Tendons: Mild  extensor peritendinitis more notably along the second and fourth extensor compartments and the extensor carpi ulnaris. Trace edema along some of the flexor tendons. Carpal tunnel/median nerve: Unremarkable Guyon's canal: No impingement. Joint/cartilage: Upper normal amount of fluid in the radiocarpal joint and midcarpal joint. No overt synovitis. Bones/carpal alignment: No findings of osteomyelitis. No fracture is observed. Other: Diffuse subcutaneous edema and enhancement are present and most notable dorsally. No drainable abscess. IMPRESSION: 1. Diffuse subcutaneous edema is present and most notable dorsally, compatible with cellulitis or possibly reflex sympathetic dystrophy. No drainable abscess. 2. Mild extensor peritendinitis more notably along the second and fourth extensor compartments and the extensor carpi ulnaris. Trace edema along some of the flexor tendons. 3. No findings of osteomyelitis. Electronically Signed   By: Gaylyn Rong M.D.   On: 05/03/2020 08:28   DG Hand Complete Left  Result Date: 05/02/2020 CLINICAL DATA:  49 year old male with left hand pain and swelling. EXAM: LEFT HAND - COMPLETE 3+ VIEW COMPARISON:  None. FINDINGS: There is no acute fracture or dislocation. The bones are well mineralized. No significant arthritic changes. There is diffuse soft tissue swelling of the hand. No radiopaque foreign object or soft tissue gas. IMPRESSION: 1. No acute osseous pathology. 2. Mild diffuse soft tissue swelling. Electronically Signed   By: Elgie Collard M.D.   On: 05/02/2020 21:30    EKG: Independently reviewed.   Assessment/Plan Principal Problem:   Cellulitis of left hand Active Problems:   Coronary artery disease involving native heart with angina pectoris (HCC)   Essential hypertension     Cellulitis of left hand Patient presents to the ER for evaluation of a 3-day history of progressively worsening pain, swelling and redness with differential warmth involving the  left hand and wrist Imaging shows cellulitis with no evidence of an abscess We will start patient on Ancef per pharmacy dosing Follow-up results of blood cultures Orthopedic surgery consult    History of coronary artery disease Status post CABG Continue aspirin, Plavix, carvedilol and statins    Essential hypertension Blood pressure is stable on carvedilol and lisinopril     DVT prophylaxis: SCD Code Status: Full code Family Communication: Greater than 50% of time was spent discussing patient's condition and plan of care with him at the bedside.  All questions and concerns have been addressed.  He verbalizes understanding and agrees with the plan. Disposition Plan: Back to previous  home environment Consults called: Orthopedic surgery    Vallie Teters MD [facility]     05/03/2020, 9:40 AM

## 2020-05-03 NOTE — ED Notes (Signed)
Pt endorsing increased pain in L hand. MD aware

## 2020-05-03 NOTE — ED Notes (Signed)
This RN introduced self to patient. Pt with noted decreased movement to fingers of L hand, +sensation, strong radial pulses noted on assessment. Pt remains A&O x4. NAD noted at this time. Pt c/o continued pain at this time.

## 2020-05-03 NOTE — ED Notes (Signed)
Pt given meal tray at this time 

## 2020-05-03 NOTE — ED Notes (Signed)
Pt resting in bed with wife at bedside. NAD noted at this time. Pt denies further needs at this time.

## 2020-05-04 LAB — CBC
HCT: 38.7 % — ABNORMAL LOW (ref 39.0–52.0)
Hemoglobin: 13.3 g/dL (ref 13.0–17.0)
MCH: 32.4 pg (ref 26.0–34.0)
MCHC: 34.4 g/dL (ref 30.0–36.0)
MCV: 94.4 fL (ref 80.0–100.0)
Platelets: 182 10*3/uL (ref 150–400)
RBC: 4.1 MIL/uL — ABNORMAL LOW (ref 4.22–5.81)
RDW: 13.1 % (ref 11.5–15.5)
WBC: 6 10*3/uL (ref 4.0–10.5)
nRBC: 0 % (ref 0.0–0.2)

## 2020-05-04 LAB — BASIC METABOLIC PANEL
Anion gap: 10 (ref 5–15)
BUN: 19 mg/dL (ref 6–20)
CO2: 25 mmol/L (ref 22–32)
Calcium: 9.6 mg/dL (ref 8.9–10.3)
Chloride: 104 mmol/L (ref 98–111)
Creatinine, Ser: 0.83 mg/dL (ref 0.61–1.24)
GFR, Estimated: >60 mL/min (ref >60)
Glucose, Bld: 100 mg/dL — ABNORMAL HIGH (ref 70–99)
Potassium: 3.7 mmol/L (ref 3.5–5.1)
Sodium: 139 mmol/L (ref 135–145)

## 2020-05-04 MED ORDER — SODIUM CHLORIDE 0.9 % IV SOLN
3.0000 g | Freq: Four times a day (QID) | INTRAVENOUS | Status: DC
  Administered 2020-05-04 – 2020-05-06 (×9): 3 g via INTRAVENOUS
  Filled 2020-05-04: qty 3
  Filled 2020-05-04: qty 8
  Filled 2020-05-04: qty 3
  Filled 2020-05-04 (×2): qty 8
  Filled 2020-05-04: qty 3
  Filled 2020-05-04 (×5): qty 8

## 2020-05-04 MED ORDER — SODIUM CHLORIDE 0.9 % IV SOLN
3.0000 g | Freq: Four times a day (QID) | INTRAVENOUS | Status: DC
  Filled 2020-05-04 (×3): qty 8

## 2020-05-04 MED ORDER — HYDRALAZINE HCL 20 MG/ML IJ SOLN: 10.0000 mg | Freq: Four times a day (QID) | INTRAMUSCULAR | Status: DC | PRN

## 2020-05-04 MED ORDER — RISAQUAD PO CAPS
1.0000 | ORAL_CAPSULE | Freq: Every day | ORAL | Status: DC
  Administered 2020-05-05 – 2020-05-06 (×2): 1 via ORAL
  Filled 2020-05-04 (×2): qty 1

## 2020-05-04 NOTE — Progress Notes (Signed)
PROGRESS NOTE    Edward Powell Vargo  UJW:[id number] DOB: [birth year] DOA: 05/02/2020 PCP: [name], [name], MD    Brief Narrative:  Edward Powell Chenoweth is a 49 y.o. male with medical history significant for coronary artery disease, hypertension and dyslipidemia who presents to the ER for evaluation of a 3-day history of pain and swelling involving his left hand  11/15-MRI no osteomyelitis or abscess. Still with Lt hand swelling and erythema  Consultants:   Orthopedics  Procedures:  MRI of left hand and wrist 1. Diffuse subcutaneous edema is present and most notable dorsally, compatible with cellulitis or possibly reflex sympathetic dystrophy. No drainable abscess. 2. Mild extensor peritendinitis more notably along the second and fourth extensor compartments and the extensor carpi ulnaris. Trace edema along some of the flexor tendons. 3. No findings of osteomyelitis.  Antimicrobials:   Cefazolin-d/c'd  unasyn -11/15-   Subjective: Has itching. No rash. No other complaints.  Objective: Vitals:   05/03/20 2358 05/04/20 0455 05/04/20 0522 05/04/20 0802  BP: 107/73 (!) 201/121 (!) 171/119 (!) 173/111  Pulse: (!) 59 63  70  Resp: 17   17  Temp: 98.6 F (37 C) 97.7 F (36.5 C)  97.8 F (36.6 C)  TempSrc: Oral Oral  Oral  SpO2: 97% 100%  100%  Weight:      Height:        Intake/Output Summary (Last 24 hours) at 05/04/2020 0841 Last data filed at 05/03/2020 1837 Gross per 24 hour  Intake 410 ml  Output --  Net 410 ml   Filed Weights   05/02/20 2022  Weight: 95.3 kg    Examination:  General exam: Appears calm and comfortable  Respiratory system: Clear to auscultation. Respiratory effort normal. Cardiovascular system: S1 & S2 heard, RRR. No JVD, murmurs, rubs, gallops or clicks.  Gastrointestinal system: Abdomen is nondistended, soft and nontender. Normal bowel sounds heard. Central nervous system: Alert and oriented. No focal neurological deficits. Extremities: LT  hand swelling, redness 1/4 past the wrist. Skin: warm, dry, no rash Psychiatry: Judgement and insight appear normal. Mood & affect appropriate.     Data Reviewed: I have personally reviewed following labs and imaging studies  CBC: Recent Labs  Lab 05/02/20 2027 05/04/20 0455  WBC 9.3 6.0  HGB 13.6 13.3  HCT 39.4 38.7*  MCV 93.8 94.4  PLT 206 182   Basic Metabolic Panel: Recent Labs  Lab 05/02/20 2027 05/04/20 0455  NA 136 139  K 3.4* 3.7  CL 103 104  CO2 23 25  GLUCOSE 172* 100*  BUN 24* 19  CREATININE 0.97 0.83  CALCIUM 9.4 9.6   GFR: Estimated Creatinine Clearance: 119.3 mL/min (by C-G formula based on SCr of 0.83 mg/dL). Liver Function Tests: Recent Labs  Lab 05/02/20 2027  AST 29  ALT 54*  ALKPHOS 99  BILITOT 0.9  PROT 8.2*  ALBUMIN 4.4   No results for input(s): LIPASE, AMYLASE in the last 168 hours. No results for input(s): AMMONIA in the last 168 hours. Coagulation Profile: No results for input(s): INR, PROTIME in the last 168 hours. Cardiac Enzymes: Recent Labs  Lab 05/02/20 2359  CKTOTAL 80   BNP (last 3 results) No results for input(s): PROBNP in the last 8760 hours. HbA1C: No results for input(s): HGBA1C in the last 72 hours. CBG: No results for input(s): GLUCAP in the last 168 hours. Lipid Profile: No results for input(s): CHOL, HDL, LDLCALC, TRIG, CHOLHDL, LDLDIRECT in the last 72 hours. Thyroid Function Tests: No  results for input(s): TSH, T4TOTAL, FREET4, T3FREE, THYROIDAB in the last 72 hours. Anemia Panel: No results for input(s): VITAMINB12, FOLATE, FERRITIN, TIBC, IRON, RETICCTPCT in the last 72 hours. Sepsis Labs: Recent Labs  Lab 05/02/20 2027 05/02/20 2308  LATICACIDVEN 1.5 1.4    Recent Results (from the past 240 hour(s))  Culture, blood (routine x 2)     Status: None (Preliminary result)   Collection Time: 05/02/20  8:27 PM   Specimen: BLOOD RIGHT HAND  Result Value Ref Range Status   Specimen Description BLOOD  RIGHT HAND  Final   Special Requests   Final    BOTTLES DRAWN AEROBIC AND ANAEROBIC Blood Culture results may not be optimal due to an excessive volume of blood received in culture bottles   Culture   Final    NO GROWTH 2 DAYS Performed at [facility], [address], [place], [place] [address]    Report Status PENDING  Incomplete  Culture, blood (routine x 2)     Status: None (Preliminary result)   Collection Time: 05/02/20 11:08 PM   Specimen: BLOOD LEFT HAND  Result Value Ref Range Status   Specimen Description BLOOD LEFT HAND  Final   Special Requests   Final    BOTTLES DRAWN AEROBIC AND ANAEROBIC Blood Culture results may not be optimal due to an inadequate volume of blood received in culture bottles   Culture   Final    NO GROWTH 2 DAYS Performed at [facility], [address] [place], [place] [address]    Report Status PENDING  Incomplete  Respiratory Panel by RT PCR (Flu A&B, Covid) - Nasopharyngeal Swab     Status: None   Collection Time: 05/03/20  4:28 AM   Specimen: Nasopharyngeal Swab  Result Value Ref Range Status   SARS Coronavirus 2 by RT PCR NEGATIVE NEGATIVE Final    Comment: (NOTE) SARS-CoV-2 target nucleic acids are NOT DETECTED.  The SARS-CoV-2 RNA is generally detectable in upper respiratoy specimens during the acute phase of infection. The lowest concentration of SARS-CoV-2 viral copies this assay can detect is 131 copies/mL. A negative result does not preclude SARS-Cov-2 infection and should not be used as the sole basis for treatment or other patient management decisions. A negative result may occur with  improper specimen collection/handling, submission of specimen other than nasopharyngeal swab, presence of viral mutation(s) within the areas targeted by this assay, and inadequate number of viral copies (<131 copies/mL). A negative result must be combined with clinical observations, patient history, and epidemiological information.  The expected result is Negative.  Fact Sheet for Patients:  https://www.moore.com/  Fact Sheet for Healthcare Providers:  https://www.young.biz/  This test is no t yet approved or cleared by the [place] FDA and  has been authorized for detection and/or diagnosis of SARS-CoV-2 by FDA under an Emergency Use Authorization (EUA). This EUA will remain  in effect (meaning this test can be used) for the duration of the COVID-19 declaration under Section 564(b)(1) of the Act, 21 U.S.C. section 360bbb-3(b)(1), unless the authorization is terminated or revoked sooner.     Influenza A by PCR NEGATIVE NEGATIVE Final   Influenza B by PCR NEGATIVE NEGATIVE Final    Comment: (NOTE) The Xpert Xpress SARS-CoV-2/FLU/RSV assay is intended as an aid in  the diagnosis of influenza from Nasopharyngeal swab specimens and  should not be used as a sole basis for treatment. Nasal washings and  aspirates are unacceptable for Xpert Xpress SARS-CoV-2/FLU/RSV  testing.  Fact Sheet for Patients: https://www.moore.com/  Fact Sheet for Healthcare Providers: https://www.young.biz/  This test is not yet approved or cleared by the [place] FDA and  has been authorized for detection and/or diagnosis of SARS-CoV-2 by  FDA under an Emergency Use Authorization (EUA). This EUA will remain  in effect (meaning this test can be used) for the duration of the  Covid-19 declaration under Section 564(b)(1) of the Act, 21  U.S.C. section 360bbb-3(b)(1), unless the authorization is  terminated or revoked. Performed at [facility], [address] [place], [place] [address]          Radiology Studies: MR HAND LEFT Powell WO CONTRAST  Result Date: 05/03/2020 CLINICAL DATA:  Left hand swelling starting 2 days ago. EXAM: MRI OF THE LEFT HAND WITHOUT AND WITH CONTRAST TECHNIQUE: Multiplanar, multisequence MR imaging of the  left hand was performed before and after the administration of intravenous contrast. Axial images seem focused on the middle finger. Coronal images exclude the distal phalanges of the index, middle, and ring finger. CONTRAST:  7.60mL GADAVIST GADOBUTROL 1 MMOL/ML IV SOLN COMPARISON:  05/02/2020 FINDINGS: Bones/Joint/Cartilage No findings of osteomyelitis. Borderline appearance for effusions in the midcarpal row and radiocarpal joint, without obvious synovitis. Mild degenerative findings of the first carpometacarpal articulation. Ligaments No obvious ligamentous disruption on today's exam, which is not focused on a specific joint. Muscles and Tendons Mild extensor tenosynovitis involving the second and fourth extensor compartments. Soft tissues Diffuse subcutaneous edema and infiltrative enhancement tracking in the dorsal hand and into the fingers. No drainable abscess. IMPRESSION: 1. Infiltrative subcutaneous edema and enhancement in the wrist and hand, extending into the fingers, with some mild associated tenosynovitis and peritendinitis of the second and fourth extensor compartments. Borderline accentuated fluid in the midcarpal row and radiocarpal joint without obvious synovitis. Differential diagnostic considerations in this setting the include cellulitis or reflex sympathetic dystrophy. No findings of osteomyelitis at this time. Electronically Signed   By: Gaylyn Rong M.D.   On: 05/03/2020 08:24   MR WRIST LEFT Powell WO CONTRAST  Result Date: 05/03/2020 CLINICAL DATA:  Wrist pain and possible infection. The patient awoke 2 days ago with swelling which has worsened. EXAM: MR OF THE LEFT WRIST WITHOUT AND WITH CONTRAST TECHNIQUE: Multiplanar multisequence MR imaging of the left wrist was performed both before and after the administration of intravenous contrast. CONTRAST:  7.76mL GADAVIST GADOBUTROL 1 MMOL/ML IV SOLN COMPARISON:  Radiographs 05/03/2019 FINDINGS: Ligaments: The intrinsic interosseous  ligaments appear intact. Triangular fibrocartilage: Unremarkable Tendons: Mild extensor peritendinitis more notably along the second and fourth extensor compartments and the extensor carpi ulnaris. Trace edema along some of the flexor tendons. Carpal tunnel/median nerve: Unremarkable Guyon's canal: No impingement. Joint/cartilage: Upper normal amount of fluid in the radiocarpal joint and midcarpal joint. No overt synovitis. Bones/carpal alignment: No findings of osteomyelitis. No fracture is observed. Other: Diffuse subcutaneous edema and enhancement are present and most notable dorsally. No drainable abscess. IMPRESSION: 1. Diffuse subcutaneous edema is present and most notable dorsally, compatible with cellulitis or possibly reflex sympathetic dystrophy. No drainable abscess. 2. Mild extensor peritendinitis more notably along the second and fourth extensor compartments and the extensor carpi ulnaris. Trace edema along some of the flexor tendons. 3. No findings of osteomyelitis. Electronically Signed   By: Gaylyn Rong M.D.   On: 05/03/2020 08:28   DG Hand Complete Left  Result Date: 05/02/2020 CLINICAL DATA:  49 year old male with left hand pain and swelling. EXAM: LEFT HAND - COMPLETE  3+ VIEW COMPARISON:  None. FINDINGS: There is no acute fracture or dislocation. The bones are well mineralized. No significant arthritic changes. There is diffuse soft tissue swelling of the hand. No radiopaque foreign object or soft tissue gas. IMPRESSION: 1. No acute osseous pathology. 2. Mild diffuse soft tissue swelling. Electronically Signed   By: Elgie Collard M.D.   On: 05/02/2020 21:30        Scheduled Meds: . aspirin EC  81 mg Oral Daily  . atorvastatin  80 mg Oral Q2000  . carvedilol  25 mg Oral BID  . celecoxib  200 mg Oral BID  . clopidogrel  75 mg Oral Daily  . lisinopril  40 mg Oral Daily  . sodium chloride flush  3 mL Intravenous Q12H   Continuous Infusions: . sodium chloride    .   ceFAZolin (ANCEF) IV 1 g (05/04/20 1062)    Assessment & Plan:   Principal Problem:   Cellulitis of left hand Active Problems:   Coronary artery disease involving native heart with angina pectoris (HCC)   Essential hypertension   Cellulitis of left hand-?from cat scratch Ortho following Per patient doing better improving slowly MRI without abscess or osteomyelitis To cover for possible cat scratch/bite will change Ancef to Unasyn for broader coverage.  On discharge can switch to Augmentin Follow-up blood cultures Hold Celebrex as patient is on Plavix and aspirin Since having pruritus unsure if it is from pain meds or antibiotics, will hold off pain meds for now and reassess Add probiotics since on antibiotics   History of coronary artery disease Status post CABG  Continue aspirin, Plavix, carvedilol and statins      Essential hypertension Mildly elevated, will add hydralazine IV as needed  Continue Coreg and lisinopril     DVT prophylaxis: SCD as patient ambulates Code Status: Full Family Communication: Wife at bedside  Status is: Inpatient  Remains inpatient appropriate because:IV treatments appropriate due to intensity of illness or inability to take PO   Dispo: The patient is from: Home              Anticipated d/c is to: Home              Anticipated d/c date is: 2 days              Patient currently is not medically stable to d/c.still needs iv abx as very slow improvement so far.            LOS: 1 day   Time spent: 35 min with >50% on coc    Lynn Ito, MD [facility] Pager [phone]  If 7PM-7AM, please contact night-coverage [email or web address] Password [facility] 05/04/2020, 8:41 AM

## 2020-05-04 NOTE — Progress Notes (Signed)
Subjective:  HD #2 for left hand swelling and erythema..   Patient reports left hand pain is somewhat improved today.  He states the swelling and erythema have also improved.  His wife is at the bedside.  Objective:   VITALS:   Vitals:   05/04/20 0455 05/04/20 0522 05/04/20 0802 05/04/20 1215  BP: (!) 201/121 (!) 171/119 (!) 173/111 123/84  Pulse: 63  70 64  Resp:   17 17  Temp: 97.7 F (36.5 C)  97.8 F (36.6 C) 98.4 F (36.9 C)  TempSrc: Oral  Oral Oral  SpO2: 100%  100% 97%  Weight:      Height:        PHYSICAL EXAM: Left hand/wrist:  Patient still has dorsal hand swelling but the erythema and swelling have improved.  He still has mild tenderness to palpation over the dorsum of his hand and wrist.  He can flex and extend to approximately 60 degrees today with his wrist which is improved from yesterday.  He is able to supinate approximately 70 degrees and pronate approximately 80 degrees.  He has improved digital range of motion especially in flexion compared to yesterday.  Patient has intact sensation light touch throughout all 5 digits of the left hand.  He states that radial and ulnar deviation of the wrist is what bothers him the most.  Patient's hand and forearm compartments are soft and compressible.  There is no expanding erythema seen.   LABS  Results for orders placed or performed during the hospital encounter of 05/02/20 (from the past 24 hour(s))  Basic metabolic panel     Status: Abnormal   Collection Time: 05/04/20  4:55 AM  Result Value Ref Range   Sodium 139 135 - 145 mmol/L   Potassium 3.7 3.5 - 5.1 mmol/L   Chloride 104 98 - 111 mmol/L   CO2 25 22 - 32 mmol/L   Glucose, Bld 100 (H) 70 - 99 mg/dL   BUN 19 6 - 20 mg/dL   Creatinine, Ser 7.37 0.61 - 1.24 mg/dL   Calcium 9.6 8.9 - 10.6 mg/dL   GFR, Estimated >26 >94 mL/min   Anion gap 10 5 - 15  CBC     Status: Abnormal   Collection Time: 05/04/20  4:55 AM  Result Value Ref Range   WBC 6.0 4.0 - 10.5  K/uL   RBC 4.10 (L) 4.22 - 5.81 MIL/uL   Hemoglobin 13.3 13.0 - 17.0 g/dL   HCT 85.4 (L) 39 - 52 %   MCV 94.4 80.0 - 100.0 fL   MCH 32.4 26.0 - 34.0 pg   MCHC 34.4 30.0 - 36.0 g/dL   RDW 62.7 03.5 - 00.9 %   Platelets 182 150 - 400 K/uL   nRBC 0.0 0.0 - 0.2 %    MR HAND LEFT W WO CONTRAST  Result Date: 05/03/2020 CLINICAL DATA:  Left hand swelling starting 2 days ago. EXAM: MRI OF THE LEFT HAND WITHOUT AND WITH CONTRAST TECHNIQUE: Multiplanar, multisequence MR imaging of the left hand was performed before and after the administration of intravenous contrast. Axial images seem focused on the middle finger. Coronal images exclude the distal phalanges of the index, middle, and ring finger. CONTRAST:  7.12mL GADAVIST GADOBUTROL 1 MMOL/ML IV SOLN COMPARISON:  05/02/2020 FINDINGS: Bones/Joint/Cartilage No findings of osteomyelitis. Borderline appearance for effusions in the midcarpal row and radiocarpal joint, without obvious synovitis. Mild degenerative findings of the first carpometacarpal articulation. Ligaments No obvious ligamentous disruption on today's exam,  which is not focused on a specific joint. Muscles and Tendons Mild extensor tenosynovitis involving the second and fourth extensor compartments. Soft tissues Diffuse subcutaneous edema and infiltrative enhancement tracking in the dorsal hand and into the fingers. No drainable abscess. IMPRESSION: 1. Infiltrative subcutaneous edema and enhancement in the wrist and hand, extending into the fingers, with some mild associated tenosynovitis and peritendinitis of the second and fourth extensor compartments. Borderline accentuated fluid in the midcarpal row and radiocarpal joint without obvious synovitis. Differential diagnostic considerations in this setting the include cellulitis or reflex sympathetic dystrophy. No findings of osteomyelitis at this time. Electronically Signed   By: Gaylyn Rong M.D.   On: 05/03/2020 08:24   MR WRIST LEFT W WO  CONTRAST  Result Date: 05/03/2020 CLINICAL DATA:  Wrist pain and possible infection. The patient awoke 2 days ago with swelling which has worsened. EXAM: MR OF THE LEFT WRIST WITHOUT AND WITH CONTRAST TECHNIQUE: Multiplanar multisequence MR imaging of the left wrist was performed both before and after the administration of intravenous contrast. CONTRAST:  7.108mL GADAVIST GADOBUTROL 1 MMOL/ML IV SOLN COMPARISON:  Radiographs 05/03/2019 FINDINGS: Ligaments: The intrinsic interosseous ligaments appear intact. Triangular fibrocartilage: Unremarkable Tendons: Mild extensor peritendinitis more notably along the second and fourth extensor compartments and the extensor carpi ulnaris. Trace edema along some of the flexor tendons. Carpal tunnel/median nerve: Unremarkable Guyon's canal: No impingement. Joint/cartilage: Upper normal amount of fluid in the radiocarpal joint and midcarpal joint. No overt synovitis. Bones/carpal alignment: No findings of osteomyelitis. No fracture is observed. Other: Diffuse subcutaneous edema and enhancement are present and most notable dorsally. No drainable abscess. IMPRESSION: 1. Diffuse subcutaneous edema is present and most notable dorsally, compatible with cellulitis or possibly reflex sympathetic dystrophy. No drainable abscess. 2. Mild extensor peritendinitis more notably along the second and fourth extensor compartments and the extensor carpi ulnaris. Trace edema along some of the flexor tendons. 3. No findings of osteomyelitis. Electronically Signed   By: Gaylyn Rong M.D.   On: 05/03/2020 08:28   DG Hand Complete Left  Result Date: 05/02/2020 CLINICAL DATA:  49 year old male with left hand pain and swelling. EXAM: LEFT HAND - COMPLETE 3+ VIEW COMPARISON:  None. FINDINGS: There is no acute fracture or dislocation. The bones are well mineralized. No significant arthritic changes. There is diffuse soft tissue swelling of the hand. No radiopaque foreign object or soft tissue  gas. IMPRESSION: 1. No acute osseous pathology. 2. Mild diffuse soft tissue swelling. Electronically Signed   By: Elgie Collard M.D.   On: 05/02/2020 21:30    Assessment/Plan:     Principal Problem:   Cellulitis of left hand Active Problems:   Coronary artery disease involving native heart with angina pectoris [facility])   Essential hypertension  Patient has experienced improvement in his left hand/wrist erythema and swelling.  His MRI demonstrated no focal collections to suggest an abscess requiring surgical drainage.  Continue on IV antibiotics.  Patient has been switched to Unasyn.  Continue to elevate the left upper extremity.  No surgical intervention is planned at this time.  Patient's WBC is down to 6 today.    Juanell Fairly , MD 05/04/2020, 3:12 PM

## 2020-05-04 NOTE — Plan of Care (Signed)

## 2020-05-05 MED ORDER — OXYCODONE HCL 5 MG PO TABS
5.0000 mg | ORAL_TABLET | ORAL | Status: DC | PRN
  Administered 2020-05-05 – 2020-05-06 (×6): 5 mg via ORAL
  Filled 2020-05-05 (×6): qty 1

## 2020-05-05 MED ORDER — AMLODIPINE BESYLATE 5 MG PO TABS
5.0000 mg | ORAL_TABLET | Freq: Every day | ORAL | Status: DC
  Administered 2020-05-05: 5 mg via ORAL
  Filled 2020-05-05: qty 1

## 2020-05-05 NOTE — Consult Note (Signed)
  Subjective:  Patient is reexamined this evening.  Patient states he has had modest improvement in his left hand and wrist.  He continues to receive IV antibiotics.  Patient has MRI with subcutaneous edema consistent with cellulitis or possible reflex sympathetic dystrophy there is no drainable abscess.  Patient had evidence of extensor tendinitis as well..  Objective:   VITALS:   Vitals:   05/05/20 0340 05/05/20 0747 05/05/20 1113 05/05/20 1630  BP: (!) 137/97 (!) 150/108 (!) 143/103 (!) 157/107  Pulse: 65 67 74 75  Resp: 15 16 17 16   Temp: 97.9 F (36.6 C)  98.1 F (36.7 C) 97.7 F (36.5 C)  TempSrc:   Oral Oral  SpO2: 99% 100% 98% 99%  Weight:      Height:        PHYSICAL EXAM: Left hand/wrist: Patient continues to have left dorsal hand swelling with mild swelling over the dorsal wrist.  There is mild edema in the proximal digits.  Patient has faint erythema which has not expanded beyond borders drawn around the area of erythema on presentation.  Patient's hand and forearm compartments are soft compressible.  He can flex and extend actively his digits with improved range of motion today.  He has intact sensation light touch in all 5 digits.  He has mild to moderate tenderness over the dorsal hand and dorsal wrist.  He can flex and extend his wrist approximately 60 degrees and can supinate approximately 70 pronate approximately 80 to 90 degrees with mild pain radial and ulnar deviation of the wrist causes him the most discomfort.   LABS  No results found for this or any previous visit (from the past 24 hour(s)).  No results found.  Assessment/Plan:     Principal Problem:   Cellulitis of left hand Active Problems:   Coronary artery disease involving native heart with angina pectoris [facility])   Essential hypertension  Patient currently on IV Unasyn.  Patient states he has a cat at home which frequently scratches and bites him although there is no obvious evidence of this over  his left hand or forearm.  Continue IV antibiotics.  Continue to elevate and ice the left upper extremity.  His MRI showed no drainable abscess and therefore surgery is not indicated at this time.    [facility] , MD 05/05/2020, 7:33 PM

## 2020-05-05 NOTE — Progress Notes (Signed)
PROGRESS NOTE    Edward Powell  TKZ:[id number] DOB: 07-20-1970 DOA: 05/02/2020 PCP: Kerman Passey, MD    Brief Narrative:  Edward Powell is a 49 y.o. male with medical history significant for coronary artery disease, hypertension and dyslipidemia who presents to the ER for evaluation of a 3-day history of pain and swelling involving his left hand  11/15-MRI no osteomyelitis or abscess. Still with Lt hand swelling and erythema 11/16- has lots of pain. Declined adding bp med to his current regimen, stated get low blood pressure.   Consultants:   Orthopedics  Procedures:  MRI of left hand and wrist 1. Diffuse subcutaneous edema is present and most notable dorsally, compatible with cellulitis or possibly reflex sympathetic dystrophy. No drainable abscess. 2. Mild extensor peritendinitis more notably along the second and fourth extensor compartments and the extensor carpi ulnaris. Trace edema along some of the flexor tendons. 3. No findings of osteomyelitis.  Antimicrobials:   Cefazolin-d/c'd  unasyn -11/15-   Subjective: No further itching with switch of abx. Has lots of pain. Has been elevating left hand  Objective: Vitals:   05/04/20 2314 05/05/20 0340 05/05/20 0747 05/05/20 1113  BP: 136/86 (!) 137/97 (!) 150/108 (!) 143/103  Pulse: 62 65 67 74  Resp: 18 15 16 17   Temp: 98.3 F (36.8 C) 97.9 F (36.6 C)  98.1 F (36.7 C)  TempSrc:    Oral  SpO2: 99% 99% 100% 98%  Weight:      Height:        Intake/Output Summary (Last 24 hours) at 05/05/2020 1402 Last data filed at 05/05/2020 1003 Gross per 24 hour  Intake 340 ml  Output --  Net 340 ml   Filed Weights   05/02/20 2022  Weight: 95.3 kg    Examination: Calm, comfortable CTA, no wheeze rales rhonchi's Regular S1-S2 no murmurs Soft benign positive bowel sounds No edema of lower extremity Left upper hand still with erythema and swelling, feels warm to touch.  Not much regression from yesterday.   Minimal swelling decreased from yesterday Alert oriented x3 Mood and affect appropriate in current setting    Data Reviewed: I have personally reviewed following labs and imaging studies  CBC: Recent Labs  Lab 05/02/20 2027 05/04/20 0455  WBC 9.3 6.0  HGB 13.6 13.3  HCT 39.4 38.7*  MCV 93.8 94.4  PLT 206 182   Basic Metabolic Panel: Recent Labs  Lab 05/02/20 2027 05/04/20 0455  NA 136 139  K 3.4* 3.7  CL 103 104  CO2 23 25  GLUCOSE 172* 100*  BUN 24* 19  CREATININE 0.97 0.83  CALCIUM 9.4 9.6   GFR: Estimated Creatinine Clearance: 119.3 mL/min (by C-G formula based on SCr of 0.83 mg/dL). Liver Function Tests: Recent Labs  Lab 05/02/20 2027  AST 29  ALT 54*  ALKPHOS 99  BILITOT 0.9  PROT 8.2*  ALBUMIN 4.4   No results for input(s): LIPASE, AMYLASE in the last 168 hours. No results for input(s): AMMONIA in the last 168 hours. Coagulation Profile: No results for input(s): INR, PROTIME in the last 168 hours. Cardiac Enzymes: Recent Labs  Lab 05/02/20 2359  CKTOTAL 80   BNP (last 3 results) No results for input(s): PROBNP in the last 8760 hours. HbA1C: No results for input(s): HGBA1C in the last 72 hours. CBG: No results for input(s): GLUCAP in the last 168 hours. Lipid Profile: No results for input(s): CHOL, HDL, LDLCALC, TRIG, CHOLHDL, LDLDIRECT in the last 72 hours.  Thyroid Function Tests: No results for input(s): TSH, T4TOTAL, FREET4, T3FREE, THYROIDAB in the last 72 hours. Anemia Panel: No results for input(s): VITAMINB12, FOLATE, FERRITIN, TIBC, IRON, RETICCTPCT in the last 72 hours. Sepsis Labs: Recent Labs  Lab 05/02/20 2027 05/02/20 2308  LATICACIDVEN 1.5 1.4    Recent Results (from the past 240 hour(s))  Culture, blood (routine x 2)     Status: None (Preliminary result)   Collection Time: 05/02/20  8:27 PM   Specimen: BLOOD RIGHT HAND  Result Value Ref Range Status   Specimen Description BLOOD RIGHT HAND  Final   Special Requests    Final    BOTTLES DRAWN AEROBIC AND ANAEROBIC Blood Culture results may not be optimal due to an excessive volume of blood received in culture bottles   Culture   Final    NO GROWTH 3 DAYS Performed at [facility], [address] [place], [place] [address]    Report Status PENDING  Incomplete  Culture, blood (routine x 2)     Status: None (Preliminary result)   Collection Time: 05/02/20 11:08 PM   Specimen: BLOOD LEFT HAND  Result Value Ref Range Status   Specimen Description BLOOD LEFT HAND  Final   Special Requests   Final    BOTTLES DRAWN AEROBIC AND ANAEROBIC Blood Culture results may not be optimal due to an inadequate volume of blood received in culture bottles   Culture   Final    NO GROWTH 3 DAYS Performed at [facility], [address], [place], [place] [address]    Report Status PENDING  Incomplete  Respiratory Panel by RT PCR (Flu A&B, Covid) - Nasopharyngeal Swab     Status: None   Collection Time: 05/03/20  4:28 AM   Specimen: Nasopharyngeal Swab  Result Value Ref Range Status   SARS Coronavirus 2 by RT PCR NEGATIVE NEGATIVE Final    Comment: (NOTE) SARS-CoV-2 target nucleic acids are NOT DETECTED.  The SARS-CoV-2 RNA is generally detectable in upper respiratoy specimens during the acute phase of infection. The lowest concentration of SARS-CoV-2 viral copies this assay can detect is 131 copies/mL. A negative result does not preclude SARS-Cov-2 infection and should not be used as the sole basis for treatment or other patient management decisions. A negative result may occur with  improper specimen collection/handling, submission of specimen other than nasopharyngeal swab, presence of viral mutation(s) within the areas targeted by this assay, and inadequate number of viral copies (<131 copies/mL). A negative result must be combined with clinical observations, patient history, and epidemiological information. The expected result is  Negative.  Fact Sheet for Patients:  https://www.moore.com/  Fact Sheet for Healthcare Providers:  https://www.young.biz/  This test is no t yet approved or cleared by the [place] FDA and  has been authorized for detection and/or diagnosis of SARS-CoV-2 by FDA under an Emergency Use Authorization (EUA). This EUA will remain  in effect (meaning this test can be used) for the duration of the COVID-19 declaration under Section 564(b)(1) of the Act, 21 U.S.C. section 360bbb-3(b)(1), unless the authorization is terminated or revoked sooner.     Influenza A by PCR NEGATIVE NEGATIVE Final   Influenza B by PCR NEGATIVE NEGATIVE Final    Comment: (NOTE) The Xpert Xpress SARS-CoV-2/FLU/RSV assay is intended as an aid in  the diagnosis of influenza from Nasopharyngeal swab specimens and  should not be used as a sole basis for treatment. Nasal washings and  aspirates are unacceptable for Xpert  Xpress SARS-CoV-2/FLU/RSV  testing.  Fact Sheet for Patients: https://www.moore.com/  Fact Sheet for Healthcare Providers: https://www.young.biz/  This test is not yet approved or cleared by the [place] FDA and  has been authorized for detection and/or diagnosis of SARS-CoV-2 by  FDA under an Emergency Use Authorization (EUA). This EUA will remain  in effect (meaning this test can be used) for the duration of the  Covid-19 declaration under Section 564(b)(1) of the Act, 21  U.S.C. section 360bbb-3(b)(1), unless the authorization is  terminated or revoked. Performed at [facility], [address] [place], [place] [address]          Radiology Studies: No results found.      Scheduled Meds: . acidophilus  1 capsule Oral Daily  . aspirin EC  81 mg Oral Daily  . atorvastatin  80 mg Oral Q2000  . carvedilol  25 mg Oral BID  . clopidogrel  75 mg Oral Daily  . lisinopril  40 mg Oral Daily   . sodium chloride flush  3 mL Intravenous Q12H   Continuous Infusions: . sodium chloride    . ampicillin-sulbactam (UNASYN) IV 3 g (05/05/20 1225)    Assessment & Plan:   Principal Problem:   Cellulitis of left hand Active Problems:   Coronary artery disease involving native heart with angina pectoris (HCC)   Essential hypertension   Cellulitis of left hand-?from cat scratch MRI without abscess or osteomyelitis To cover for possible cat scratch/bite will change Ancef to Unasyn for broader coverage.  On discharge can switch to Augmentin Minimal improvement today compared to yesterday Continue hand elevation Ortho following-no surgical intervention planned at this time Leukocytosis down to 6 today Blood cultures to date negative We will add oxycodone for pain Continue IV antibiotics   History of coronary artery disease History of CABG  Asymptomatic currently  Continue aspirin, Plavix, Coreg and statin    Essential hypertension Blood pressure elevated.  Was going to add amlodipine to current regimen but patient declined as he states his blood pressure drops at times when this was attempted in the past  We will continue Coreg and lisinopril  Continue IV hydralazine as needed    DVT prophylaxis: SCD as patient ambulates Code Status: Full Family Communication: Wife at bedside  Status is: Inpatient  Remains inpatient appropriate because:IV treatments appropriate due to intensity of illness or inability to take PO   Dispo: The patient is from: Home              Anticipated d/c is to: Home              Anticipated d/c date is: 1-2 days              Patient currently is not medically stable to d/c.still needs iv abx .If more improvement by tomorrow can switch to po abx and dc home            LOS: 2 days   Time spent: 35 min with >50% on coc    Lynn Ito, MD [facility] Pager [phone]  If 7PM-7AM, please contact  night-coverage [email or web address] Password [email or web address] 05/05/2020, 2:02 PM

## 2020-05-06 MED ORDER — OXYCODONE HCL 5 MG PO TABS: 5.0000 mg | ORAL_TABLET | ORAL | 0 refills | Status: DC | PRN

## 2020-05-06 MED ORDER — AMOXICILLIN-POT CLAVULANATE 875-125 MG PO TABS: 1.0000 | ORAL_TABLET | Freq: Two times a day (BID) | ORAL | 0 refills | Status: AC

## 2020-05-06 MED ORDER — IBUPROFEN 400 MG PO TABS: 400.0000 mg | ORAL_TABLET | Freq: Four times a day (QID) | ORAL | Status: DC | PRN

## 2020-05-06 NOTE — Discharge Summary (Signed)
Physician Discharge Summary  Edward Powell YDX:[id number] DOB: May 28, 1971 DOA: 05/02/2020  PCP: Kerman Passey, MD  Admit date: 05/02/2020 Discharge date: 05/06/2020  Admitted From: Home Disposition: Home  Recommendations for Outpatient Follow-up:  1. Follow up with PCP in 1-2 weeks 2. Follow-up with orthopedics in 1 week if hand remained swollen  Home Health: No Equipment/Devices: None Discharge Condition: Stable CODE STATUS: Full Diet recommendation: Heart Healthy  Brief/Interim Summary: Martell W Crossis a 48 y.o.malewith medical history significant forcoronary artery disease, hypertension and dyslipidemia who presents to the ER for evaluation of a 3-day history of pain and swelling involving his left hand  11/15-MRI no osteomyelitis or abscess. Still with Lt hand swelling and erythema 11/16- has lots of pain. Declined adding bp med to his current regimen, stated get low blood pressure.  11/17-pain much better controlled.  Swelling decreased.  Stable for discharge home at this time.  Communicated with orthopedics.  Plan to discharge on oral antibiotics.  Patient continues to have pain or swelling after 1week can call orthopedic office for follow-up and requests a visit with hand surgery specialist.  Discharge Diagnoses:  Principal Problem:   Cellulitis of left hand Active Problems:   Coronary artery disease involving native heart with angina pectoris (HCC)   Essential hypertension  Cellulitis of left hand-?from cat scratch MRI without abscess or osteomyelitis Unasyn while in-house Transition to Augmentin on discharge Pain and swelling markedly improved on date of discharge Patient able to make a fist and elevate extremities without too much issue Communicated with orthopedics, okay with discharge Follow-up as needed with orthopedics in 1 week if pain and swelling persist Discharged on additional 7 days of Augmentin 875 mg twice daily As needed pain  control   History of coronary artery disease History of CABG  Asymptomatic currently  Continue aspirin, Plavix, Coreg and statin    Essential hypertension Continue home regimen on discharge.  Suspect that hypertension in house secondary to pain.  Discharge Instructions  Discharge Instructions    Diet - low sodium heart healthy   Complete by: As directed    Discharge wound care:   Complete by: As directed    Elevate hand.  Use cold compress as needed   Increase activity slowly   Complete by: As directed      Allergies as of 05/06/2020   No Known Allergies     Medication List    TAKE these medications   acetaminophen 325 MG tablet Commonly known as: TYLENOL Take 2 tablets (650 mg total) by mouth every 6 (six) hours as needed for mild pain (or Fever >/= 101).   amoxicillin-clavulanate 875-125 MG tablet Commonly known as: Augmentin Take 1 tablet by mouth 2 (two) times daily for 7 days.   aspirin 81 MG EC tablet Take 1 tablet (81 mg total) by mouth daily.   atorvastatin 80 MG tablet Commonly known as: LIPITOR Take 1 tablet (80 mg total) by mouth daily at 6 PM.   carvedilol 25 MG tablet Commonly known as: COREG Take 1 tablet (25 mg total) by mouth 2 (two) times daily with a meal.   clopidogrel 75 MG tablet Commonly known as: PLAVIX Take 1 tablet (75 mg total) by mouth daily.   ibuprofen 400 MG tablet Commonly known as: ADVIL Take 1 tablet (400 mg total) by mouth every 6 (six) hours as needed for mild pain.   lisinopril 40 MG tablet Commonly known as: ZESTRIL Take 1 tablet (40 mg total) by mouth daily.  oxyCODONE 5 MG immediate release tablet Commonly known as: Oxy IR/ROXICODONE Take 1 tablet (5 mg total) by mouth every 4 (four) hours as needed for severe pain.            Discharge Care Instructions  (From admission, onward)         Start     Ordered   05/06/20 0000  Discharge wound care:       Comments: Elevate hand.  Use cold compress as  needed   05/06/20 1034          Follow-up Information    Jamelle Haring, MD. Schedule an appointment as soon as possible for a visit on 05/19/2020.   Specialty: Internal Medicine Why: (NOTE:  Dr. Baruch Gouty No Longer w/ Practice)   PCP; @  3:00 pm Contact information: [address] [place] [address] [phone]        Iran Ouch, MD On 05/13/2020.   Specialty: Cardiology Why: @ 2:20 pm Contact information: [address] [place] [address] [phone]        Juanell Fairly, MD Follow up.   Specialty: Orthopedic Surgery Why: If you are still having pain and swelling 1 week after discharge, please contact Dr. Samuel Germany office for followup appt.  Please request to see Dr. Stephenie Acres, who is the hand surgery specialist Contact information: [address] [place] [address] [phone]              No Known Allergies  Consultations:  Orthopedics   Procedures/Studies: MR HAND LEFT W WO CONTRAST  Result Date: 05/03/2020 CLINICAL DATA:  Left hand swelling starting 2 days ago. EXAM: MRI OF THE LEFT HAND WITHOUT AND WITH CONTRAST TECHNIQUE: Multiplanar, multisequence MR imaging of the left hand was performed before and after the administration of intravenous contrast. Axial images seem focused on the middle finger. Coronal images exclude the distal phalanges of the index, middle, and ring finger. CONTRAST:  7.62mL GADAVIST GADOBUTROL 1 MMOL/ML IV SOLN COMPARISON:  05/02/2020 FINDINGS: Bones/Joint/Cartilage No findings of osteomyelitis. Borderline appearance for effusions in the midcarpal row and radiocarpal joint, without obvious synovitis. Mild degenerative findings of the first carpometacarpal articulation. Ligaments No obvious ligamentous disruption on today's exam, which is not focused on a specific joint. Muscles and Tendons Mild extensor tenosynovitis involving the second and fourth extensor compartments.  Soft tissues Diffuse subcutaneous edema and infiltrative enhancement tracking in the dorsal hand and into the fingers. No drainable abscess. IMPRESSION: 1. Infiltrative subcutaneous edema and enhancement in the wrist and hand, extending into the fingers, with some mild associated tenosynovitis and peritendinitis of the second and fourth extensor compartments. Borderline accentuated fluid in the midcarpal row and radiocarpal joint without obvious synovitis. Differential diagnostic considerations in this setting the include cellulitis or reflex sympathetic dystrophy. No findings of osteomyelitis at this time. Electronically Signed   By: Gaylyn Rong M.D.   On: 05/03/2020 08:24   MR WRIST LEFT W WO CONTRAST  Result Date: 05/03/2020 CLINICAL DATA:  Wrist pain and possible infection. The patient awoke 2 days ago with swelling which has worsened. EXAM: MR OF THE LEFT WRIST WITHOUT AND WITH CONTRAST TECHNIQUE: Multiplanar multisequence MR imaging of the left wrist was performed both before and after the administration of intravenous contrast. CONTRAST:  7.22mL GADAVIST GADOBUTROL 1 MMOL/ML IV SOLN COMPARISON:  Radiographs 05/03/2019 FINDINGS: Ligaments: The intrinsic interosseous ligaments appear intact. Triangular fibrocartilage: Unremarkable Tendons: Mild extensor peritendinitis more notably along the second and  fourth extensor compartments and the extensor carpi ulnaris. Trace edema along some of the flexor tendons. Carpal tunnel/median nerve: Unremarkable Guyon's canal: No impingement. Joint/cartilage: Upper normal amount of fluid in the radiocarpal joint and midcarpal joint. No overt synovitis. Bones/carpal alignment: No findings of osteomyelitis. No fracture is observed. Other: Diffuse subcutaneous edema and enhancement are present and most notable dorsally. No drainable abscess. IMPRESSION: 1. Diffuse subcutaneous edema is present and most notable dorsally, compatible with cellulitis or possibly reflex  sympathetic dystrophy. No drainable abscess. 2. Mild extensor peritendinitis more notably along the second and fourth extensor compartments and the extensor carpi ulnaris. Trace edema along some of the flexor tendons. 3. No findings of osteomyelitis. Electronically Signed   By: Gaylyn Rong M.D.   On: 05/03/2020 08:28   DG Hand Complete Left  Result Date: 05/02/2020 CLINICAL DATA:  49 year old male with left hand pain and swelling. EXAM: LEFT HAND - COMPLETE 3+ VIEW COMPARISON:  None. FINDINGS: There is no acute fracture or dislocation. The bones are well mineralized. No significant arthritic changes. There is diffuse soft tissue swelling of the hand. No radiopaque foreign object or soft tissue gas. IMPRESSION: 1. No acute osseous pathology. 2. Mild diffuse soft tissue swelling. Electronically Signed   By: Elgie Collard M.D.   On: 05/02/2020 21:30    (Echo, Carotid, EGD, Colonoscopy, ERCP)    Subjective: Seen and examined on the day of discharge.  Left hand swelling but much improved from prior.  No other complaints  Discharge Exam: Vitals:   05/06/20 0820 05/06/20 1123  BP: (!) 171/104 131/89  Pulse: 78 74  Resp: 17 19  Temp: 97.7 F (36.5 C) 98.3 F (36.8 C)  SpO2: 99% 95%   Vitals:   05/06/20 0002 05/06/20 0350 05/06/20 0820 05/06/20 1123  BP: (!) 142/97 123/78 (!) 171/104 131/89  Pulse: 66 66 78 74  Resp: Temp: 98.3 F (36.8 C) 97.7 F (36.5 C) 97.7 F (36.5 C) 98.3 F (36.8 C)  TempSrc: Oral Oral Oral Oral  SpO2: 98% 98% 99% 95%  Weight:      Height:        General: Pt is alert, awake, not in acute distress Cardiovascular: RRR, S1/S2 +, no rubs, no gallops Respiratory: CTA bilaterally, no wheezing, no rhonchi Abdominal: Soft, NT, ND, bowel sounds + Extremities: Left hand swollen.  Able to make fist and raise arm without pain    The results of significant diagnostics from this hospitalization (including imaging, microbiology, ancillary and  laboratory) are listed below for reference.     Microbiology: Recent Results (from the past 240 hour(s))  Culture, blood (routine x 2)     Status: None (Preliminary result)   Collection Time: 05/02/20  8:27 PM   Specimen: BLOOD RIGHT HAND  Result Value Ref Range Status   Specimen Description BLOOD RIGHT HAND  Final   Special Requests   Final    BOTTLES DRAWN AEROBIC AND ANAEROBIC Blood Culture results may not be optimal due to an excessive volume of blood received in culture bottles   Culture   Final    NO GROWTH 4 DAYS Performed at [facility], [address] [place], [place] [address]    Report Status PENDING  Incomplete  Culture, blood (routine x 2)     Status: None (Preliminary result)   Collection Time: 05/02/20 11:08 PM   Specimen: BLOOD LEFT HAND  Result Value Ref Range Status   Specimen Description BLOOD LEFT  HAND  Final   Special Requests   Final    BOTTLES DRAWN AEROBIC AND ANAEROBIC Blood Culture results may not be optimal due to an inadequate volume of blood received in culture bottles   Culture   Final    NO GROWTH 4 DAYS Performed at [facility], [address] [place], [place] [address]    Report Status PENDING  Incomplete  Respiratory Panel by RT PCR (Flu A&B, Covid) - Nasopharyngeal Swab     Status: None   Collection Time: 05/03/20  4:28 AM   Specimen: Nasopharyngeal Swab  Result Value Ref Range Status   SARS Coronavirus 2 by RT PCR NEGATIVE NEGATIVE Final    Comment: (NOTE) SARS-CoV-2 target nucleic acids are NOT DETECTED.  The SARS-CoV-2 RNA is generally detectable in upper respiratoy specimens during the acute phase of infection. The lowest concentration of SARS-CoV-2 viral copies this assay can detect is 131 copies/mL. A negative result does not preclude SARS-Cov-2 infection and should not be used as the sole basis for treatment or other patient management decisions. A negative result may occur with  improper specimen  collection/handling, submission of specimen other than nasopharyngeal swab, presence of viral mutation(s) within the areas targeted by this assay, and inadequate number of viral copies (<131 copies/mL). A negative result must be combined with clinical observations, patient history, and epidemiological information. The expected result is Negative.  Fact Sheet for Patients:  https://www.moore.com/  Fact Sheet for Healthcare Providers:  https://www.young.biz/  This test is no t yet approved or cleared by the [place] FDA and  has been authorized for detection and/or diagnosis of SARS-CoV-2 by FDA under an Emergency Use Authorization (EUA). This EUA will remain  in effect (meaning this test can be used) for the duration of the COVID-19 declaration under Section 564(b)(1) of the Act, 21 U.S.C. section 360bbb-3(b)(1), unless the authorization is terminated or revoked sooner.     Influenza A by PCR NEGATIVE NEGATIVE Final   Influenza B by PCR NEGATIVE NEGATIVE Final    Comment: (NOTE) The Xpert Xpress SARS-CoV-2/FLU/RSV assay is intended as an aid in  the diagnosis of influenza from Nasopharyngeal swab specimens and  should not be used as a sole basis for treatment. Nasal washings and  aspirates are unacceptable for Xpert Xpress SARS-CoV-2/FLU/RSV  testing.  Fact Sheet for Patients: https://www.moore.com/  Fact Sheet for Healthcare Providers: https://www.young.biz/  This test is not yet approved or cleared by the [place] FDA and  has been authorized for detection and/or diagnosis of SARS-CoV-2 by  FDA under an Emergency Use Authorization (EUA). This EUA will remain  in effect (meaning this test can be used) for the duration of the  Covid-19 declaration under Section 564(b)(1) of the Act, 21  U.S.C. section 360bbb-3(b)(1), unless the authorization is  terminated or revoked. Performed at [facility], [address] [place], [place] [address]      Labs: BNP (last 3 results) No results for input(s): BNP in the last 8760 hours. Basic Metabolic Panel: Recent Labs  Lab 05/02/20 2027 05/04/20 0455  NA 136 139  K 3.4* 3.7  CL 103 104  CO2 23 25  GLUCOSE 172* 100*  BUN 24* 19  CREATININE 0.97 0.83  CALCIUM 9.4 9.6   Liver Function Tests: Recent Labs  Lab 05/02/20 2027  AST 29  ALT 54*  ALKPHOS 99  BILITOT 0.9  PROT 8.2*  ALBUMIN 4.4   No results for input(s): LIPASE, AMYLASE in the last  168 hours. No results for input(s): AMMONIA in the last 168 hours. CBC: Recent Labs  Lab 05/02/20 2027 05/04/20 0455  WBC 9.3 6.0  HGB 13.6 13.3  HCT 39.4 38.7*  MCV 93.8 94.4  PLT 206 182   Cardiac Enzymes: Recent Labs  Lab 05/02/20 2359  CKTOTAL 80   BNP: Invalid input(s): POCBNP CBG: No results for input(s): GLUCAP in the last 168 hours. D-Dimer No results for input(s): DDIMER in the last 72 hours. Hgb A1c No results for input(s): HGBA1C in the last 72 hours. Lipid Profile No results for input(s): CHOL, HDL, LDLCALC, TRIG, CHOLHDL, LDLDIRECT in the last 72 hours. Thyroid function studies No results for input(s): TSH, T4TOTAL, T3FREE, THYROIDAB in the last 72 hours.  Invalid input(s): FREET3 Anemia work up No results for input(s): VITAMINB12, FOLATE, FERRITIN, TIBC, IRON, RETICCTPCT in the last 72 hours. Urinalysis    Component Value Date/Time   COLORURINE YELLOW 01/10/2018 0500   APPEARANCEUR CLEAR 01/10/2018 0500   LABSPEC 1.023 01/10/2018 0500   PHURINE 5.0 01/10/2018 0500   GLUCOSEU NEGATIVE 01/10/2018 0500   HGBUR NEGATIVE 01/10/2018 0500   BILIRUBINUR NEGATIVE 01/10/2018 0500   KETONESUR NEGATIVE 01/10/2018 0500   PROTEINUR NEGATIVE 01/10/2018 0500   NITRITE NEGATIVE 01/10/2018 0500   LEUKOCYTESUR NEGATIVE 01/10/2018 0500   Sepsis Labs Invalid input(s): PROCALCITONIN,  WBC,  LACTICIDVEN Microbiology Recent Results (from the past  240 hour(s))  Culture, blood (routine x 2)     Status: None (Preliminary result)   Collection Time: 05/02/20  8:27 PM   Specimen: BLOOD RIGHT HAND  Result Value Ref Range Status   Specimen Description BLOOD RIGHT HAND  Final   Special Requests   Final    BOTTLES DRAWN AEROBIC AND ANAEROBIC Blood Culture results may not be optimal due to an excessive volume of blood received in culture bottles   Culture   Final    NO GROWTH 4 DAYS Performed at [facility], [address] [place], [place] [address]    Report Status PENDING  Incomplete  Culture, blood (routine x 2)     Status: None (Preliminary result)   Collection Time: 05/02/20 11:08 PM   Specimen: BLOOD LEFT HAND  Result Value Ref Range Status   Specimen Description BLOOD LEFT HAND  Final   Special Requests   Final    BOTTLES DRAWN AEROBIC AND ANAEROBIC Blood Culture results may not be optimal due to an inadequate volume of blood received in culture bottles   Culture   Final    NO GROWTH 4 DAYS Performed at [facility], [address] [place], [place] [address]    Report Status PENDING  Incomplete  Respiratory Panel by RT PCR (Flu A&B, Covid) - Nasopharyngeal Swab     Status: None   Collection Time: 05/03/20  4:28 AM   Specimen: Nasopharyngeal Swab  Result Value Ref Range Status   SARS Coronavirus 2 by RT PCR NEGATIVE NEGATIVE Final    Comment: (NOTE) SARS-CoV-2 target nucleic acids are NOT DETECTED.  The SARS-CoV-2 RNA is generally detectable in upper respiratoy specimens during the acute phase of infection. The lowest concentration of SARS-CoV-2 viral copies this assay can detect is 131 copies/mL. A negative result does not preclude SARS-Cov-2 infection and should not be used as the sole basis for treatment or other patient management decisions. A negative result may occur with  improper specimen collection/handling, submission of specimen other than nasopharyngeal swab, presence of viral mutation(s)  within the areas targeted  by this assay, and inadequate number of viral copies (<131 copies/mL). A negative result must be combined with clinical observations, patient history, and epidemiological information. The expected result is Negative.  Fact Sheet for Patients:  https://www.moore.com/  Fact Sheet for Healthcare Providers:  https://www.young.biz/  This test is no t yet approved or cleared by the [place] FDA and  has been authorized for detection and/or diagnosis of SARS-CoV-2 by FDA under an Emergency Use Authorization (EUA). This EUA will remain  in effect (meaning this test can be used) for the duration of the COVID-19 declaration under Section 564(b)(1) of the Act, 21 U.S.C. section 360bbb-3(b)(1), unless the authorization is terminated or revoked sooner.     Influenza A by PCR NEGATIVE NEGATIVE Final   Influenza B by PCR NEGATIVE NEGATIVE Final    Comment: (NOTE) The Xpert Xpress SARS-CoV-2/FLU/RSV assay is intended as an aid in  the diagnosis of influenza from Nasopharyngeal swab specimens and  should not be used as a sole basis for treatment. Nasal washings and  aspirates are unacceptable for Xpert Xpress SARS-CoV-2/FLU/RSV  testing.  Fact Sheet for Patients: https://www.moore.com/  Fact Sheet for Healthcare Providers: https://www.young.biz/  This test is not yet approved or cleared by the [place] FDA and  has been authorized for detection and/or diagnosis of SARS-CoV-2 by  FDA under an Emergency Use Authorization (EUA). This EUA will remain  in effect (meaning this test can be used) for the duration of the  Covid-19 declaration under Section 564(b)(1) of the Act, 21  U.S.C. section 360bbb-3(b)(1), unless the authorization is  terminated or revoked. Performed at [facility], [address], [place], [place] [address]      Time coordinating discharge: Over 30  minutes  SIGNED:   Tresa Moore, MD  [facility] 05/06/2020, 2:57 PM Pager   If 7PM-7AM, please contact night-coverage

## 2020-05-07 LAB — CULTURE, BLOOD (ROUTINE X 2)
Culture: NO GROWTH
Culture: NO GROWTH

## 2020-05-13 ENCOUNTER — Ambulatory Visit: Payer: BC Managed Care – PPO | Admitting: Nurse Practitioner

## 2020-05-19 ENCOUNTER — Inpatient Hospital Stay: Payer: BLUE CROSS/BLUE SHIELD | Admitting: Internal Medicine

## 2020-06-25 ENCOUNTER — Ambulatory Visit: Payer: Self-pay

## 2020-06-25 ENCOUNTER — Encounter: Payer: Self-pay | Admitting: Emergency Medicine

## 2021-04-21 ENCOUNTER — Other Ambulatory Visit: Payer: Self-pay | Admitting: *Deleted

## 2021-04-21 MED ORDER — LISINOPRIL 40 MG PO TABS: 40.0000 mg | ORAL_TABLET | Freq: Every day | ORAL | 1 refills | Status: DC

## 2021-05-11 MED ORDER — CLOPIDOGREL BISULFATE 75 MG PO TABS: 75.0000 mg | ORAL_TABLET | Freq: Every day | ORAL | 1 refills | Status: DC

## 2021-06-30 DIAGNOSIS — Z124 Encounter for screening for malignant neoplasm of cervix: Secondary | ICD-10-CM | POA: Diagnosis not present

## 2021-06-30 MED ORDER — LISINOPRIL 40 MG PO TABS: 40.0000 mg | ORAL_TABLET | Freq: Every day | ORAL | 0 refills | Status: DC

## 2021-07-05 NOTE — Progress Notes (Deleted)
Cardiology Office Note    Date:  07/05/2021   ID:  Edward Powell W Dicenzo, DOB 1970/09/24, MRN [id number]  PCP:  [name], [name], MD  Cardiologist:  Lorine BearsMuhammad Arida, MD  Electrophysiologist:  None   Chief Complaint: Follow-up  History of Present Illness:   Edward SpeckingDarin W Powell is a 51 y.o. male with history of CAD status post CABG in 12/2017 followed by subsequent PCI as outlined below, severe hyperlipidemia, hypertension, tobacco use, and excessive alcohol use who presents for follow-up of his CAD.   He was admitted to the hospital in 12/2017 with a non-STEMI in the setting of severely elevated blood pressure.  Cardiac cath showed severe three-vessel CAD with a normal EF.  Echo showed an EF of 55 to 60%, grade 1 diastolic dysfunction, no significant valvular abnormalities.  He underwent successful four-vessel CABG on 01/10/2018 with LIMA to LAD, sequential SVG to ramus and distal LCx, and SVG to rPDA.  Following his surgery he quit smoking and decreased alcohol intake.  He was noted to have recurrent angina in 05/2018 and underwent a Lexiscan Myoview which was high risk with evidence of large inferior lateral and lateral ischemia with more than 1 mm ST depression at low-level exercise.  In this setting, he underwent diagnostic cardiac cath which showed significant three-vessel CAD with patent LIMA to LAD and patent SVG to ramus.  There was an occluded jump graft from SVG to OM 3 and SVG to rPDA.  The LCx was chronically occluded with collaterals.  The RCA was diffusely diseased in multiple areas.  EF was 35 to 45% with moderately elevated LVEDP.  He returned for staged PCI of the RCA which was performed with 3 drug-eluting stents on 06/14/2018.  Echo in 08/2018 showed an EF of 55 to 60%, normal LV cavity size, normal LV diastolic function, normal RV systolic function, mildly elevated RVSP of 41.6 mmHg, no significant valvular abnormalities.  He was last seen in the office in 12/2019 and was without symptoms concerning  for angina.  No changes were made at that time  ***   Labs independently reviewed: 04/2020 - Hgb 13.3, PLT 182, potassium 3.7, BUN 19, serum creatinine 0.83, albumin 4.4, AST normal, ALT 54 02/2018 - TC 135, TG 245, HDL 32, LDL 54 12/2017 - magnesium 2.1  Past Medical History:  Diagnosis Date   Alcohol abuse    CAD (coronary artery disease)    a. 12/2017 NSTEMI/Cath: LM 50d, LAD 60ost, 4441m, RI nl, LCX 100p, OM2 fills via collats from LAD, RCA 70p, 1634m, 7276m, 60d, EF 55-65%; b. 12/2017 CABG x 4: LIMA->LAD, VG->RI->LCX, VG->RPDA.   Diastolic dysfunction    a. 12/2017 Echo: EF 55-60%, no rwma, Gr1 DD, triv MR, nl RV fxn.   Essential hypertension    Hyperlipidemia LDL goal <70    Hypertriglyceridemia    Status post primary angioplasty with coronary stent 06/15/2018   Indefinite dual antiplatelet therapy per Dec 2019 note, Dr. Kirke CorinArida   Tobacco abuse     Past Surgical History:  Procedure Laterality Date   CORONARY ARTERY BYPASS GRAFT N/A 01/10/2018   Procedure: CORONARY ARTERY BYPASS GRAFTING (CABG);  Surgeon: Delight OvensGerhardt, Edward B, MD;  [facility];  Service: Open Heart Surgery;  Laterality: N/A;  Times 4 using left internal mammary artery to the LAD and endoscopically harvested left saphenous vein to OM1, OM2, and PLB   CORONARY STENT INTERVENTION N/A 06/14/2018   Procedure: CORONARY STENT INTERVENTION;  Surgeon: Iran OuchArida, Muhammad A, MD;  Location:  [facility] INVASIVE CV LAB;  Service: Cardiovascular;  Laterality: N/A;   LEFT HEART CATH AND CORONARY ANGIOGRAPHY N/A 01/09/2018   Procedure: LEFT HEART CATH AND CORONARY ANGIOGRAPHY;  Surgeon: Yvonne Kendall, MD;  Location: [facility] INVASIVE CV LAB;  Service: Cardiovascular;  Laterality: N/A;   LEFT HEART CATH AND CORONARY ANGIOGRAPHY N/A 05/31/2018   Procedure: LEFT HEART CATH AND CORONARY ANGIOGRAPHY;  Surgeon: Iran Ouch, MD;  Location: [facility] INVASIVE CV LAB;  Service: Cardiovascular;  Laterality: N/A;   NO PAST SURGERIES     TEE WITHOUT  CARDIOVERSION N/A 01/10/2018   Procedure: TRANSESOPHAGEAL ECHOCARDIOGRAM (TEE);  Surgeon: Delight Ovens, MD;  Location: [facility] OR;  Service: Open Heart Surgery;  Laterality: N/A;    Current Medications: No outpatient medications have been marked as taking for the 07/06/21 encounter (Appointment) with Sondra Barges, PA-C.    Allergies:   Patient has no known allergies.   Social History   Socioeconomic History   Marital status: Not on file    Spouse name: Not on file   Number of children: Not on file   Years of education: 12   Highest education level: Some college, no degree  Occupational History   Not on file  Tobacco Use   Smoking status: Former    Packs/day: 1.00    Years: 30.00    Pack years: 30.00    Types: Cigarettes   Smokeless tobacco: Never   Tobacco comments:    last cigarette was day of the heart attack  Vaping Use   Vaping Use: Never used  Substance and Sexual Activity   Alcohol use: Not Currently    Alcohol/week: 9.0 standard drinks    Types: 4 Cans of beer, 5 Shots of liquor per week    Comment: up to 7-9 shots twice weekly - vodka   Drug use: Not Currently   Sexual activity: Not on file  Other Topics Concern   Not on file  Social History Narrative   ** Merged History Encounter **       Lives in [place] with significant other.  No children.  Does not routinely exercise.   Social Determinants of Health   Financial Resource Strain: Not on file  Food Insecurity: Not on file  Transportation Needs: Not on file  Physical Activity: Not on file  Stress: Not on file  Social Connections: Not on file     Family History:  The patient's family history includes Alzheimer's disease in his paternal grandmother; Atrial fibrillation in his father; Breast cancer in his mother; Heart attack in his maternal grandmother; Hyperlipidemia in his brother, maternal grandmother, and mother; Hypertension in his brother, father, maternal grandmother, mother, and paternal  grandmother; Liver cancer in his father; Lung cancer in his father; Stroke in his brother, maternal grandmother, and paternal grandmother.  ROS:   ROS   EKGs/Labs/Other Studies Reviewed:    Studies reviewed were summarized above. The additional studies were reviewed today:  2D Echo 08/2018: 1. The left ventricle has normal systolic function, with an ejection fraction of 55-60%. The cavity size was normal. Left ventricular diastolic parameters were normal.  2. The right ventricle has normal systolic function. The cavity was normal. There is no increase in right ventricular wall thickness. Right ventricular systolic pressure is mildly elevated with an estimated pressure of 41.6 mmHg.  3. The mitral valve is grossly normal.  4. The tricuspid valve is grossly normal.  5. The aortic valve was not well visualized Aortic valve regurgitation was not  assessed by color flow Doppler. __________   LHC 05/31/2018: Dist LM lesion is 50% stenosed. Ost LAD lesion is 60% stenosed. Prox LAD to Mid LAD lesion is 80% stenosed. Prox Cx lesion is 100% stenosed. Mid RCA-1 lesion is 40% stenosed. Mid RCA-2 lesion is 95% stenosed. Dist RCA lesion is 60% stenosed. Prox RCA lesion is 70% stenosed. Prox Graft to Mid Graft lesion is 100% stenosed. LIMA graft was visualized by non-selective angiography and is normal in caliber. The graft exhibits no disease. SVG. Origin lesion is 100% stenosed. LV end diastolic pressure is moderately elevated. The left ventricular ejection fraction is 35-45% by visual estimate. There is moderate left ventricular systolic dysfunction.   1.  Significant underlying three-vessel coronary artery disease with patent LIMA to LAD and patent SVG to ramus.  Occluded jump graft from SVG to OM 3 and SVG to right PDA.  The left circumflex is chronically occluded with collaterals.  The RCA is diffusely diseased in multiple areas. 2.  Moderately reduced LV systolic function moderately  elevated left ventricular end-diastolic pressure.   Recommendations: The best option might be RCA PCI but that will require multiple stents in order to reconstruct probably the whole artery.  This is best to be staged.  The patient might need a small dose diuretic. We will consider switching lisinopril to Entresto. __________   PCI 06/14/2018: Dist LM lesion is 50% stenosed. Ost LAD lesion is 60% stenosed. Prox LAD to Mid LAD lesion is 80% stenosed. Prox Cx lesion is 100% stenosed. Mid RCA-1 lesion is 40% stenosed. Dist RCA lesion is 85% stenosed. Prox RCA lesion is 70% stenosed. Mid RCA-2 lesion is 99% stenosed. Prox Graft to Mid Graft lesion is 100% stenosed. LIMA and is normal in caliber. The graft exhibits no disease. SVG. Origin lesion is 100% stenosed. Post intervention, there is a 0% residual stenosis. A drug-eluting stent was successfully placed using a STENT RESOLUTE ONYX [id number]. A drug-eluting stent was successfully placed using a STENT RESOLUTE ONYX [id number]. Post intervention, there is a 0% residual stenosis. Post intervention, there is a 0% residual stenosis. A drug-eluting stent was successfully placed using a STENT RESOLUTE ONYX [id number]. Post intervention, there is a 0% residual stenosis.   Successful complex angioplasty and 3 drug-eluting stent placement to the right coronary artery.   Recommendations: Dual antiplatelet therapy indefinitely if possible .  Aggressive treatment of risk factors and cardiac rehab   EKG:  EKG is ordered today.  The EKG ordered today demonstrates ***  Recent Labs: No results found for requested labs within last 8760 hours.  Recent Lipid Panel    Component Value Date/Time   CHOL 135 03/06/2018 0845   TRIG 245 (H) 03/06/2018 0845   HDL 32 (L) 03/06/2018 0845   CHOLHDL 4.2 03/06/2018 0845   CHOLHDL 5.5 01/09/2018 0510   VLDL 53 (H) 01/09/2018 0510   LDLCALC 54 03/06/2018 0845    PHYSICAL EXAM:    VS:  There were no vitals  taken for this visit.  BMI: There is no height or weight on file to calculate BMI.  Physical Exam  Wt Readings from Last 3 Encounters:  05/02/20 210 lb (95.3 kg)  01/16/20 (!) 222 lb (100.7 kg)  01/18/19 202 lb (91.6 kg)     ASSESSMENT & PLAN:   CAD status post CABG without***angina:  HTN: Blood pressure ***  HLD: LDL 54 in 2019.  ***   {Are you ordering a CV Procedure (e.g. stress test, cath, DCCV, TEE, etc)?  Press F2        :[id number]}     Disposition: F/u with Dr. Kirke Corin or an APP in ***.   Medication Adjustments/Labs and Tests Ordered: Current medicines are reviewed at length with the patient today.  Concerns regarding medicines are outlined above. Medication changes, Labs and Tests ordered today are summarized above and listed in the Patient Instructions accessible in Encounters.   Signed, Eula Listen, PA-C 07/05/2021 9:40 AM     [facility] [place] [address] [place], [place] [address] [phone]

## 2021-07-06 ENCOUNTER — Ambulatory Visit: Payer: BC Managed Care – PPO | Admitting: Physician Assistant

## 2021-07-08 ENCOUNTER — Other Ambulatory Visit: Payer: Self-pay | Admitting: *Deleted

## 2021-07-08 MED ORDER — CARVEDILOL 25 MG PO TABS: 25.0000 mg | ORAL_TABLET | Freq: Two times a day (BID) | ORAL | 0 refills | Status: DC

## 2021-07-27 ENCOUNTER — Other Ambulatory Visit: Payer: Self-pay

## 2021-07-27 ENCOUNTER — Ambulatory Visit: Payer: BC Managed Care – PPO | Admitting: Physician Assistant

## 2021-07-27 ENCOUNTER — Encounter: Payer: Self-pay | Admitting: Physician Assistant

## 2021-07-27 VITALS — BP 190/130 | HR 78 | Ht 68.0 in | Wt 233.0 lb

## 2021-07-27 DIAGNOSIS — E785 Hyperlipidemia, unspecified: Secondary | ICD-10-CM

## 2021-07-27 DIAGNOSIS — I10 Essential (primary) hypertension: Secondary | ICD-10-CM

## 2021-07-27 DIAGNOSIS — Z951 Presence of aortocoronary bypass graft: Secondary | ICD-10-CM | POA: Diagnosis not present

## 2021-07-27 DIAGNOSIS — I5020 Unspecified systolic (congestive) heart failure: Secondary | ICD-10-CM

## 2021-07-27 DIAGNOSIS — I2510 Atherosclerotic heart disease of native coronary artery without angina pectoris: Secondary | ICD-10-CM

## 2021-07-27 MED ORDER — CLOPIDOGREL BISULFATE 75 MG PO TABS: 75.0000 mg | ORAL_TABLET | Freq: Every day | ORAL | 3 refills | Status: DC

## 2021-07-27 MED ORDER — LISINOPRIL 40 MG PO TABS: 40.0000 mg | ORAL_TABLET | Freq: Every day | ORAL | 3 refills | Status: DC

## 2021-07-27 MED ORDER — ATORVASTATIN CALCIUM 80 MG PO TABS: 80.0000 mg | ORAL_TABLET | Freq: Every day | ORAL | 0 refills | Status: DC

## 2021-07-27 MED ORDER — CARVEDILOL 25 MG PO TABS: 25.0000 mg | ORAL_TABLET | Freq: Two times a day (BID) | ORAL | 3 refills | Status: DC

## 2021-07-27 NOTE — Progress Notes (Signed)
Cardiology Office Note    Date:  07/27/2021   ID:  Edward Powell, DOB 12-May-1971, MRN [id number]  PCP:  Kerman Passey, MD  Cardiologist:  Lorine Bears, MD  Electrophysiologist:  None   Chief Complaint: Follow up  History of Present Illness:   Edward Powell is a 51 y.o. male with history of CAD status post CABG in 12/2017 followed by subsequent PCI as outlined below, HFrEF secondary to ICM with subsequent normalization of LV systolic function, severe hyperlipidemia, hypertension, tobacco use, and excessive alcohol use who presents for follow-up of his CAD.   He was admitted to the hospital in 12/2017 with a non-STEMI in the setting of severely elevated blood pressure.  Cardiac cath showed severe three-vessel CAD with a normal EF.  Echo showed an EF of 55 to 60%, grade 1 diastolic dysfunction, no significant valvular abnormalities.  He underwent successful four-vessel CABG on 01/10/2018 with LIMA to LAD, sequential SVG to ramus and distal LCx, and SVG to rPDA.  Following his surgery he quit smoking and decreased alcohol intake.  He was noted to have recurrent angina in 05/2018 and underwent a Lexiscan Myoview which was high risk with evidence of large inferolateral and lateral ischemia with more than 1 mm ST depression at low-level exercise.  In this setting, he underwent diagnostic cardiac cath which showed significant three-vessel CAD with patent LIMA to LAD and patent SVG to ramus.  There was an occluded jump graft from SVG to OM 3 and SVG to rPDA.  The LCx was chronically occluded with collaterals.  The RCA was diffusely diseased in multiple areas.  EF was 35 to 45% with moderately elevated LVEDP.  He returned for staged PCI of the RCA which was performed with 3 drug-eluting stents on 06/14/2018.  Echo in 08/2018 showed an EF of 55 to 60%, normal LV cavity size, normal LV diastolic function, normal RV systolic function, mildly elevated RVSP of 41.6 mmHg, no significant valvular abnormalities.  He  was last seen in the office in 12/2019 and was without symptoms concerning for angina.  No changes were made at that time.  He comes in doing well from a cardiac perspective and is without symptoms of angina or decompensation.  No dyspnea, palpitations, dizziness, presyncope, or syncope.  No lower extremity swelling.  In the winter months he is largely sedentary, though does spend most of his time outdoors in the warmer months.  He is adherent to his medications outside of lisinopril, which she has been out of for the past month.  He reports a long history of whitecoat hypertension with BP that has been well controlled at home.  No falls, hematochezia, or melena.  He has not smoked since his MI.  He does drink alcohol socially, though not on a daily basis.  He does not have any active cardiac issues or concerns at this time.   Labs independently reviewed: 04/2020 - Hgb 13.3, PLT 182, potassium 3.7, BUN 19, serum creatinine 0.83, albumin 4.4, AST normal, ALT 54 02/2018 - TC 135, TG 245, HDL 32, LDL 54 12/2017 - magnesium 2.1    Past Medical History:  Diagnosis Date   Alcohol abuse    CAD (coronary artery disease)    a. 12/2017 NSTEMI/Cath: LM 50d, LAD 60ost, 85m, RI nl, LCX 100p, OM2 fills via collats from LAD, RCA 70p, 69m, 15m, 60d, EF 55-65%; b. 12/2017 CABG x 4: LIMA->LAD, VG->RI->LCX, VG->RPDA.   Diastolic dysfunction    a. 12/2017 Echo:  EF 55-60%, no rwma, Gr1 DD, triv MR, nl RV fxn.   Essential hypertension    Hyperlipidemia LDL goal <70    Hypertriglyceridemia    Status post primary angioplasty with coronary stent 06/15/2018   Indefinite dual antiplatelet therapy per Dec 2019 note, Dr. Kirke CorinArida   Tobacco abuse     Past Surgical History:  Procedure Laterality Date   CORONARY ARTERY BYPASS GRAFT N/A 01/10/2018   Procedure: CORONARY ARTERY BYPASS GRAFTING (CABG);  Surgeon: Delight OvensGerhardt, Edward B, MD;  [facility];  Service: Open Heart Surgery;  Laterality: N/A;  Times 4 using left internal  mammary artery to the LAD and endoscopically harvested left saphenous vein to OM1, OM2, and PLB   CORONARY STENT INTERVENTION N/A 06/14/2018   Procedure: CORONARY STENT INTERVENTION;  Surgeon: Iran OuchArida, Muhammad A, MD;  Location: [facility] INVASIVE CV LAB;  Service: Cardiovascular;  Laterality: N/A;   LEFT HEART CATH AND CORONARY ANGIOGRAPHY N/A 01/09/2018   Procedure: LEFT HEART CATH AND CORONARY ANGIOGRAPHY;  Surgeon: Yvonne KendallEnd, Christopher, MD;  Location: [facility] INVASIVE CV LAB;  Service: Cardiovascular;  Laterality: N/A;   LEFT HEART CATH AND CORONARY ANGIOGRAPHY N/A 05/31/2018   Procedure: LEFT HEART CATH AND CORONARY ANGIOGRAPHY;  Surgeon: Iran OuchArida, Muhammad A, MD;  Location: [facility] INVASIVE CV LAB;  Service: Cardiovascular;  Laterality: N/A;   NO PAST SURGERIES     TEE WITHOUT CARDIOVERSION N/A 01/10/2018   Procedure: TRANSESOPHAGEAL ECHOCARDIOGRAM (TEE);  Surgeon: Delight OvensGerhardt, Edward B, MD;  [facility];  Service: Open Heart Surgery;  Laterality: N/A;    Current Medications: Current Meds  Medication Sig   acetaminophen (TYLENOL) 325 MG tablet Take 2 tablets (650 mg total) by mouth every 6 (six) hours as needed for mild pain (or Fever >/= 101).   aspirin EC 81 MG EC tablet Take 1 tablet (81 mg total) by mouth daily.   ibuprofen (ADVIL) 400 MG tablet Take 1 tablet (400 mg total) by mouth every 6 (six) hours as needed for mild pain.   oxyCODONE (OXY IR/ROXICODONE) 5 MG immediate release tablet Take 1 tablet (5 mg total) by mouth every 4 (four) hours as needed for severe pain.   [DISCONTINUED] atorvastatin (LIPITOR) 80 MG tablet Take 1 tablet (80 mg total) by mouth daily at 6 PM.   [DISCONTINUED] carvedilol (COREG) 25 MG tablet Take 1 tablet (25 mg total) by mouth 2 (two) times daily with a meal.   [DISCONTINUED] clopidogrel (PLAVIX) 75 MG tablet Take 1 tablet (75 mg total) by mouth daily. **Will need to call to make an appointment for future refills**    Allergies:   Patient has no known allergies.   Social  History   Socioeconomic History   Marital status: Married    Spouse name: Not on file   Number of children: Not on file   Years of education: 12   Highest education level: Some college, no degree  Occupational History   Not on file  Tobacco Use   Smoking status: Former    Packs/day: 1.00    Years: 30.00    Pack years: 30.00    Types: Cigarettes   Smokeless tobacco: Never   Tobacco comments:    last cigarette was day of the heart attack  Vaping Use   Vaping Use: Never used  Substance and Sexual Activity   Alcohol use: Yes    Alcohol/week: 9.0 standard drinks    Types: 4 Cans of beer, 5 Shots of liquor per week    Comment: up to 7-9  shots twice weekly - vodka   Drug use: Not Currently   Sexual activity: Not on file  Other Topics Concern   Not on file  Social History Narrative   ** Merged History Encounter **       Lives in [place] with significant other.  No children.  Does not routinely exercise.   Social Determinants of Health   Financial Resource Strain: Not on file  Food Insecurity: Not on file  Transportation Needs: Not on file  Physical Activity: Not on file  Stress: Not on file  Social Connections: Not on file     Family History:  The patient's family history includes Alzheimer's disease in his paternal grandmother; Atrial fibrillation in his father; Breast cancer in his mother; Heart attack in his maternal grandmother; Hyperlipidemia in his brother, maternal grandmother, and mother; Hypertension in his brother, father, maternal grandmother, mother, and paternal grandmother; Liver cancer in his father; Lung cancer in his father; Stroke in his brother, maternal grandmother, and paternal grandmother.  ROS:   12-point review of systems is negative unless otherwise noted in the HPI.   EKGs/Labs/Other Studies Reviewed:    Studies reviewed were summarized above. The additional studies were reviewed today:  2D Echo 08/2018: 1. The left ventricle has normal  systolic function, with an ejection fraction of 55-60%. The cavity size was normal. Left ventricular diastolic parameters were normal.  2. The right ventricle has normal systolic function. The cavity was normal. There is no increase in right ventricular wall thickness. Right ventricular systolic pressure is mildly elevated with an estimated pressure of 41.6 mmHg.  3. The mitral valve is grossly normal.  4. The tricuspid valve is grossly normal.  5. The aortic valve was not well visualized Aortic valve regurgitation was not assessed by color flow Doppler. __________   LHC 05/31/2018: Dist LM lesion is 50% stenosed. Ost LAD lesion is 60% stenosed. Prox LAD to Mid LAD lesion is 80% stenosed. Prox Cx lesion is 100% stenosed. Mid RCA-1 lesion is 40% stenosed. Mid RCA-2 lesion is 95% stenosed. Dist RCA lesion is 60% stenosed. Prox RCA lesion is 70% stenosed. Prox Graft to Mid Graft lesion is 100% stenosed. LIMA graft was visualized by non-selective angiography and is normal in caliber. The graft exhibits no disease. SVG. Origin lesion is 100% stenosed. LV end diastolic pressure is moderately elevated. The left ventricular ejection fraction is 35-45% by visual estimate. There is moderate left ventricular systolic dysfunction.   1.  Significant underlying three-vessel coronary artery disease with patent LIMA to LAD and patent SVG to ramus.  Occluded jump graft from SVG to OM 3 and SVG to right PDA.  The left circumflex is chronically occluded with collaterals.  The RCA is diffusely diseased in multiple areas. 2.  Moderately reduced LV systolic function moderately elevated left ventricular end-diastolic pressure.   Recommendations: The best option might be RCA PCI but that will require multiple stents in order to reconstruct probably the whole artery.  This is best to be staged.  The patient might need a small dose diuretic. We will consider switching lisinopril to Entresto. __________   PCI  06/14/2018: Dist LM lesion is 50% stenosed. Ost LAD lesion is 60% stenosed. Prox LAD to Mid LAD lesion is 80% stenosed. Prox Cx lesion is 100% stenosed. Mid RCA-1 lesion is 40% stenosed. Dist RCA lesion is 85% stenosed. Prox RCA lesion is 70% stenosed. Mid RCA-2 lesion is 99% stenosed. Prox Graft to Mid Graft lesion is 100% stenosed. LIMA  and is normal in caliber. The graft exhibits no disease. SVG. Origin lesion is 100% stenosed. Post intervention, there is a 0% residual stenosis. A drug-eluting stent was successfully placed using a STENT RESOLUTE ONYX [id number]. A drug-eluting stent was successfully placed using a STENT RESOLUTE ONYX [id number]. Post intervention, there is a 0% residual stenosis. Post intervention, there is a 0% residual stenosis. A drug-eluting stent was successfully placed using a STENT RESOLUTE ONYX [id number]. Post intervention, there is a 0% residual stenosis.   Successful complex angioplasty and 3 drug-eluting stent placement to the right coronary artery.   Recommendations: Dual antiplatelet therapy indefinitely if possible .  Aggressive treatment of risk factors and cardiac rehab   EKG:  EKG is ordered today.  The EKG ordered today demonstrates NSR, 78 bpm, prior inferior infarct, no acute st/t changes   Recent Labs: No results found for requested labs within last 8760 hours.  Recent Lipid Panel    Component Value Date/Time   CHOL 135 03/06/2018 0845   TRIG 245 (H) 03/06/2018 0845   HDL 32 (L) 03/06/2018 0845   CHOLHDL 4.2 03/06/2018 0845   CHOLHDL 5.5 01/09/2018 0510   VLDL 53 (H) 01/09/2018 0510   LDLCALC 54 03/06/2018 0845    PHYSICAL EXAM:    VS:  BP (!) 190/130 (BP Location: Left Arm, Patient Position: Sitting, Cuff Size: Large)    Pulse 78    Ht 5\' 8"  (1.727 m)    Wt 233 lb (105.7 kg)    SpO2 98%    BMI 35.43 kg/m   BMI: Body mass index is 35.43 kg/m.  Physical Exam Vitals reviewed.  Constitutional:      Appearance: He is well-developed.   HENT:     Head: Normocephalic and atraumatic.  Eyes:     General:        Right eye: No discharge.        Left eye: No discharge.  Neck:     Vascular: No JVD.  Cardiovascular:     Rate and Rhythm: Normal rate and regular rhythm.     Pulses:          Posterior tibial pulses are 2+ on the right side and 2+ on the left side.     Heart sounds: Normal heart sounds, S1 normal and S2 normal. Heart sounds not distant. No midsystolic click and no opening snap. No murmur heard.   No friction rub.  Pulmonary:     Effort: Pulmonary effort is normal. No respiratory distress.     Breath sounds: Normal breath sounds. No decreased breath sounds, wheezing or rales.  Chest:     Chest wall: No tenderness.  Abdominal:     General: There is no distension.     Palpations: Abdomen is soft.     Tenderness: There is no abdominal tenderness.  Musculoskeletal:     Cervical back: Normal range of motion.     Right lower leg: No edema.     Left lower leg: No edema.  Skin:    General: Skin is warm and dry.     Nails: There is no clubbing.  Neurological:     Mental Status: He is alert and oriented to person, place, and time.  Psychiatric:        Speech: Speech normal.        Behavior: Behavior normal.        Thought Content: Thought content normal.        Judgment: Judgment normal.  Wt Readings from Last 3 Encounters:  07/27/21 233 lb (105.7 kg)  05/02/20 210 lb (95.3 kg)  01/16/20 (!) 222 lb (100.7 kg)     ASSESSMENT & PLAN:   CAD status post CABG without angina: He is doing well without symptoms concerning for angina.  Continue secondary prevention and current medical therapy including aspirin, clopidogrel, atorvastatin, carvedilol, and lisinopril.  No indication for further ischemic testing at this time.  HFrEF secondary to ICM: Echo following surgical and subsequent percutaneous revascularization showed normalization of LV systolic function.  He remains on carvedilol and lisinopril.  Not  requiring a standing diuretic.  He politely declines labs today.  He is aware of the need for routine monitoring given medical therapy.  HTN: Blood pressure is elevated in the office today with a history of whitecoat hypertension.  He reports well-controlled blood pressures at home.  Continue carvedilol and lisinopril.  He was advised to contact our office if he sees elevated blood pressures at home.  HLD: LDL 54 in 2019.  He remains on atorvastatin and politely declines labs.  Goal LDL was discussed.   Disposition: F/u with Dr. Kirke Corin or an APP in 12 months.   Medication Adjustments/Labs and Tests Ordered: Current medicines are reviewed at length with the patient today.  Concerns regarding medicines are outlined above. Medication changes, Labs and Tests ordered today are summarized above and listed in the Patient Instructions accessible in Encounters.   Signed, Eula Listen, PA-C 07/27/2021 4:38 PM     [facility] [address] [place], [place] [address] [phone]

## 2021-07-27 NOTE — Patient Instructions (Signed)
Medication Instructions:  °No changes at this time. ° °*If you need a refill on your cardiac medications before your next appointment, please call your pharmacy* ° ° °Lab Work: °None ° °If you have labs (blood work) drawn today and your tests are completely normal, you will receive your results only by: °MyChart Message (if you have MyChart) OR °A paper copy in the mail °If you have any lab test that is abnormal or we need to change your treatment, we will call you to review the results. ° ° °Testing/Procedures: °None ° ° °Follow-Up: °At [facility], you and your health needs are our priority.  As part of our continuing mission to provide you with exceptional heart care, we have created designated Provider Care Teams.  These Care Teams include your primary Cardiologist (physician) and Advanced Practice Providers (APPs -  Physician Assistants and Nurse Practitioners) who all work together to provide you with the care you need, when you need it. ° ° °Your next appointment:   °1 year(s) ° °The format for your next appointment:   °In Person ° °Provider:   °Muhammad Arida, MD or Ryan Dunn, PA-C  ° °

## 2022-08-15 ENCOUNTER — Other Ambulatory Visit: Payer: Self-pay | Admitting: Physician Assistant

## 2022-08-15 NOTE — Telephone Encounter (Signed)
Good Morning Caryl Pina,  Could you please schedule this patient a 12 month follow up visit? The patient was last seen by Ryan in 07/2021. Thank you so much.

## 2022-08-18 NOTE — Telephone Encounter (Signed)
LMOV to schedule  

## 2022-09-12 ENCOUNTER — Emergency Department
Admission: EM | Admit: 2022-09-12 | Discharge: 2022-09-12 | Disposition: A | Discharge status: Home / Self Care | Payer: BC Managed Care – PPO | Attending: Emergency Medicine | Admitting: Emergency Medicine

## 2022-09-12 ENCOUNTER — Other Ambulatory Visit: Payer: Self-pay

## 2022-09-12 ENCOUNTER — Emergency Department: Payer: BC Managed Care – PPO

## 2022-09-12 DIAGNOSIS — E860 Dehydration: Secondary | ICD-10-CM

## 2022-09-12 DIAGNOSIS — I10 Essential (primary) hypertension: Secondary | ICD-10-CM | POA: Insufficient documentation

## 2022-09-12 DIAGNOSIS — I2510 Atherosclerotic heart disease of native coronary artery without angina pectoris: Secondary | ICD-10-CM | POA: Insufficient documentation

## 2022-09-12 DIAGNOSIS — N179 Acute kidney failure, unspecified: Secondary | ICD-10-CM | POA: Diagnosis not present

## 2022-09-12 DIAGNOSIS — I959 Hypotension, unspecified: Secondary | ICD-10-CM | POA: Insufficient documentation

## 2022-09-12 DIAGNOSIS — Z951 Presence of aortocoronary bypass graft: Secondary | ICD-10-CM | POA: Insufficient documentation

## 2022-09-12 DIAGNOSIS — R079 Chest pain, unspecified: Secondary | ICD-10-CM | POA: Diagnosis not present

## 2022-09-12 LAB — BASIC METABOLIC PANEL
Anion gap: 15 (ref 5–15)
BUN: 14 mg/dL (ref 6–20)
CO2: 18 mmol/L — ABNORMAL LOW (ref 22–32)
Calcium: 9.5 mg/dL (ref 8.9–10.3)
Chloride: 102 mmol/L (ref 98–111)
Creatinine, Ser: 1.7 mg/dL — ABNORMAL HIGH (ref 0.61–1.24)
GFR, Estimated: 48 mL/min — ABNORMAL LOW (ref >60)
Glucose, Bld: 144 mg/dL — ABNORMAL HIGH (ref 70–99)
Potassium: 4.9 mmol/L (ref 3.5–5.1)
Sodium: 135 mmol/L (ref 135–145)

## 2022-09-12 LAB — CBC
HCT: 46.9 % (ref 39.0–52.0)
Hemoglobin: 15.4 g/dL (ref 13.0–17.0)
MCH: 34.6 pg — ABNORMAL HIGH (ref 26.0–34.0)
MCHC: 32.8 g/dL (ref 30.0–36.0)
MCV: 105.4 fL — ABNORMAL HIGH (ref 80.0–100.0)
Platelets: 199 10*3/uL (ref 150–400)
RBC: 4.45 MIL/uL (ref 4.22–5.81)
RDW: 13.1 % (ref 11.5–15.5)
WBC: 9.2 10*3/uL (ref 4.0–10.5)
nRBC: 0 % (ref 0.0–0.2)

## 2022-09-12 LAB — HEPATIC FUNCTION PANEL
ALT: 218 U/L — ABNORMAL HIGH (ref 0–44)
AST: 430 U/L — ABNORMAL HIGH (ref 15–41)
Albumin: 3.4 g/dL — ABNORMAL LOW (ref 3.5–5.0)
Alkaline Phosphatase: 106 U/L (ref 38–126)
Bilirubin, Direct: 0.3 mg/dL — ABNORMAL HIGH (ref 0.0–0.2)
Indirect Bilirubin: 0.8 mg/dL (ref 0.3–0.9)
Total Bilirubin: 1.1 mg/dL (ref 0.3–1.2)
Total Protein: 7.8 g/dL (ref 6.5–8.1)

## 2022-09-12 LAB — LIPASE, BLOOD: Lipase: 61 U/L — ABNORMAL HIGH (ref 11–51)

## 2022-09-12 LAB — TROPONIN I (HIGH SENSITIVITY)
Troponin I (High Sensitivity): 11 ng/L (ref <18)
Troponin I (High Sensitivity): 10 ng/L (ref <18)

## 2022-09-12 LAB — T4, FREE: Free T4: 1.02 ng/dL (ref 0.61–1.12)

## 2022-09-12 LAB — TSH: TSH: 18.699 u[IU]/mL — ABNORMAL HIGH (ref 0.350–4.500)

## 2022-09-12 LAB — CK: Total CK: 39 U/L — ABNORMAL LOW (ref 49–397)

## 2022-09-12 MED ORDER — LACTATED RINGERS IV BOLUS
1000.0000 mL | Freq: Once | INTRAVENOUS | Status: AC
  Administered 2022-09-12: 1000 mL via INTRAVENOUS

## 2022-09-12 NOTE — ED Notes (Signed)
ED Provider at bedside. 

## 2022-09-12 NOTE — ED Provider Notes (Addendum)
[facility] Provider Note    Event Date/Time   First MD Initiated Contact with Patient 09/12/22 1254     (approximate)   History   Hypotension   HPI  Edward Powell is a 52 y.o. male with history of coronary disease status post CABG 2019 with successful PCI's afterwards, hypertension, hyperlipidemia who comes in with concerns for hypotension.  They report that patient's had about 1 to 2 weeks of increased fatigue and just being more weak he has not been eating and drinking as much.  Denies any falls or hitting his head.  He denies any new chest pain.  He reports some pain on his bilateral nipples but this is baseline for him over the past few years and he denies it being any worse today than normal.  Denies any abdominal pain.  He does report an episode this morning where he had near syncopal but did not fall down did not hit his head.  He had another episode a few days ago as well.  His blood pressure was in the 50s at home and therefore he came in to the hospital.  He denies any shortness of breath.  Does report that the episode happened soon after taking his blood pressure medications the first time that he had been not taken him over the weekend he felt better and then took his blood pressure medications again today and then developed this repeat symptoms.   Physical Exam   Triage Vital Signs: ED Triage Vitals  Enc Vitals Group     BP 09/12/22 1236 (!) 82/58     Pulse Rate 09/12/22 1236 78     Resp 09/12/22 1236 20     Temp --      Temp src --      SpO2 09/12/22 1236 93 %     Weight 09/12/22 1237 230 lb (104.3 kg)     Height 09/12/22 1237 5\' 8"  (1.727 m)     Head Circumference --      Peak Flow --      Pain Score 09/12/22 1236 0     Pain Loc --      Pain Edu? --      Excl. in [place]? --     Most recent vital signs: Vitals:   09/12/22 1236 09/12/22 1300  BP: (!) 82/58 (!) 84/57  Pulse: 78 74  Resp: 20 16  SpO2: 93% 94%     General: Awake, no  distress.  CV:  Good peripheral perfusion.  Resp:  Normal effort.  Abd:  No distention.  Other:  Abdomen soft and nontender.  Chest wall soft and nontender.  Good good distal pulses throughout.  Equal strength in arms and legs.  Sensation intact. No swelling in legs no calf tenderness   ED Results / Procedures / Treatments   Labs (all labs ordered are listed, but only abnormal results are displayed) Labs Reviewed  CBC - Abnormal; Notable for the following components:      Result Value   MCV 105.4 (*)    MCH 34.6 (*)    All other components within normal limits  BASIC METABOLIC PANEL - Abnormal; Notable for the following components:   CO2 18 (*)    Glucose, Bld 144 (*)    Creatinine, Ser 1.70 (*)    GFR, Estimated 48 (*)    All other components within normal limits  URINALYSIS, ROUTINE W REFLEX MICROSCOPIC  T4, FREE  HEPATIC FUNCTION PANEL  LIPASE, BLOOD  TSH  TROPONIN I (HIGH SENSITIVITY)  TROPONIN I (HIGH SENSITIVITY)     EKG  My interpretation of EKG:  Normal sinus rhythm 75 without any ST elevation, slight T wave version in lead III, normal intervals  RADIOLOGY I have reviewed the xray personally and interpreted and no pneumonia  PROCEDURES:  Critical Care performed: No  .1-3 Lead EKG Interpretation  Performed by: Vanessa [place], MD Authorized by: Vanessa [place], MD     Interpretation: normal     ECG rate:  70   ECG rate assessment: normal     Rhythm: sinus rhythm     Ectopy: none     Conduction: normal      MEDICATIONS ORDERED IN ED: Medications  lactated ringers bolus 1,000 mL (has no administration in time range)     IMPRESSION / MDM / ASSESSMENT AND PLAN / ED COURSE  I reviewed the triage vital signs and the nursing notes.   Patient's presentation is most consistent with acute presentation with potential threat to life or bodily function.   Patient comes in with concerns for near syncopal episode increasing fatigue, weakness.  No evidence of  stroke on examination and is generalized weakness.  No new chest pain but cardiac markers were ordered.  Abdomen soft and nontender.  Will give patient a liter of fluid.  Doubt dissection given normal chest x-ray good distal pulses all throughout no numbness, tingling.  Doubt PE given he denies any shortness of breath.  X-ray negative CBC reassuring.  BMP shows elevated creatinine of 1.7 patient getting IV fluids.  Low bicarb of 18.  Initial troponin is negative.  Patient reports feeling improved after fluids.  Will give additional 1 L while pending the rest of his results.  Discussed his elevated AKI.  Discussed holding his lisinopril for now due to concerns with the AKI that it is causing a worsening effect on his blood pressure.  We discussed admission for echocardiogram to make sure there is no signs of any stenosis but patient declined saying that he would prefer to go home.  His repeat troponin is pending.  Patient will be handed off to oncoming team pending the rest of his blood work, troponin, fluid and ambulation trial.  He states that he would strongly prefer to go home if his symptoms are improving.  His abdomen remains soft and nontender continues to deny any shortness of breath oxygen levels look good.  The patient is on the cardiac monitor to evaluate for evidence of arrhythmia and/or significant heart rate changes.      FINAL CLINICAL IMPRESSION(S) / ED DIAGNOSES   Final diagnoses:  Dehydration  AKI (acute kidney injury) ([place])     Rx / DC Orders   ED Discharge Orders     None        Note:  This document was prepared using Dragon voice recognition software and may include unintentional dictation errors.   Vanessa [place], MD 09/12/22 1521    Vanessa [place], MD 09/12/22 [phone]

## 2022-09-12 NOTE — ED Notes (Signed)
Pt ambulatory independently with steady gait.

## 2022-09-12 NOTE — Discharge Instructions (Addendum)
We discussed admission for cardiac monitoring, echocardiogram but you have declined.  I suspect that this could just be related to some dehydration and your kidney function is elevated which could be making your blood pressure medication have a stronger effect.  I recommend holding the lisinopril while your kidney function is resolving. Drink plenty of fluid and follow-up with your cardiologist or your PCP for a recheck of kidney function this week to get restarted back on your blood pressure medicine.  Return to the ER if develop shortness of breath, abdominal pain, chest pain or any other concerns.

## 2022-09-12 NOTE — ED Triage Notes (Signed)
Pt to ED for hypotension today and generalized weakness for one week. Hx triple bypass. Reports has chest pain currently but always has chest pain.  Pt appears lethargic.

## 2022-09-14 ENCOUNTER — Telehealth: Payer: Self-pay | Admitting: Physician Assistant

## 2022-09-14 NOTE — Telephone Encounter (Signed)
Pt c/o medication issue:  1. Name of Medication: lisinopril (ZESTRIL) 40 MG tablet   2. How are you currently taking this medication (dosage and times per day)? Take 1 tablet (40 mg total) by mouth daily  3. Are you having a reaction (difficulty breathing--STAT)? No   4. What is your medication issue? Medication drops patient's BP to dangerously low levels. Went to ER. Last BP reading, top number was in the 50's

## 2022-09-14 NOTE — Telephone Encounter (Signed)
Called and spoke with the patient. He was recently in the ED for hypotension.  Per ED note: Patient reports feeling improved after fluids. Will give additional 1 L while pending the rest of his results. Discussed his elevated AKI. Discussed holding his lisinopril for now due to concerns with the AKI that it is causing a worsening effect on his blood pressure. We discussed admission for echocardiogram to make sure there is no signs of any stenosis but patient declined saying that he would prefer to go home.   This morning, before medication, his blood pressure was 153/98. He took Carvedilol 25 mg and 10 mg of Lisinopril. Two hours later his blood pressure was 90/66. He has been advised, that per the ED note, he should hold off on the Lisinopril for now.   Follow up appointment made for 4/1 with Gerrie Nordmann, NP.

## 2022-09-15 NOTE — Telephone Encounter (Signed)
Spoke with patient and reviewed provider recommendations. He verbalized understanding with no further questions at this time.  

## 2022-09-15 NOTE — Telephone Encounter (Signed)
Agree with holding lisinopril for now with drop in BP and AKI.  Ensure patient is staying adequately hydrated.  Follow-up as planned.

## 2022-09-16 ENCOUNTER — Encounter: Payer: Self-pay | Admitting: *Deleted

## 2022-09-19 ENCOUNTER — Encounter: Payer: Self-pay | Admitting: Cardiology

## 2022-09-19 ENCOUNTER — Ambulatory Visit: Payer: BC Managed Care – PPO | Attending: Cardiology | Admitting: Cardiology

## 2022-09-19 VITALS — BP 142/94 | HR 79 | Ht 68.0 in | Wt 224.8 lb

## 2022-09-19 DIAGNOSIS — I5020 Unspecified systolic (congestive) heart failure: Secondary | ICD-10-CM | POA: Diagnosis not present

## 2022-09-19 DIAGNOSIS — I2510 Atherosclerotic heart disease of native coronary artery without angina pectoris: Secondary | ICD-10-CM

## 2022-09-19 DIAGNOSIS — N179 Acute kidney failure, unspecified: Secondary | ICD-10-CM

## 2022-09-19 DIAGNOSIS — Z951 Presence of aortocoronary bypass graft: Secondary | ICD-10-CM

## 2022-09-19 DIAGNOSIS — R55 Syncope and collapse: Secondary | ICD-10-CM | POA: Diagnosis not present

## 2022-09-19 DIAGNOSIS — I255 Ischemic cardiomyopathy: Secondary | ICD-10-CM

## 2022-09-19 DIAGNOSIS — R0602 Shortness of breath: Secondary | ICD-10-CM

## 2022-09-19 DIAGNOSIS — E785 Hyperlipidemia, unspecified: Secondary | ICD-10-CM | POA: Diagnosis not present

## 2022-09-19 NOTE — Progress Notes (Signed)
Cardiology Office Note:   Date:  09/19/2022  ID:  Edward Powell, DOB [birth year], MRN [id number]  History of Present Illness:   Edward Powell is a 52 y.o. male with a past medical history of coronary artery disease status post CABG in 12/2017 followed by subsequent PCI, HFrEF secondary to ischemic cardiomyopathy with subsequent normalization of LV systolic function, severe hyperlipidemia, essential hypertension, tobacco use, excessive alcohol use, who presents today for follow-up after recent visit to the emergency department for hypotension.  He was admitted to the hospital in 12/2017 with a non-STEMI in the setting of severely elevated blood pressure.  Cardiac cath revealed severe three-vessel CAD with a normal EF.  Echo demonstrated an EF of 55 to 60%, G1 DD, no significant valvular abnormalities noted.  He underwent successful four-vessel CABG on 01/10/2018 with a LIMA to LAD, SVG to ramus and distal left circumflex, and SVG to RPDA.  Following surgery he had quit smoking and decreased his alcohol intake.  He was noted to have recurrent angina in 05/2018 and underwent Lexiscan Myoview which was deemed high risk with evidence of large inferior lateral and lateral ischemia with more than 1 mm ST depression and low-level exercise.  He then underwent an diagnostic catheterization which showed significant three-vessel CAD with patent LIMA to LAD and patent SVG to ramus.  There was an occluded jump graft from saphenous to OM 3 and saphenous to the RPDA.  The left circumflex was chronically occluded with collaterals.  The RCA was diffusely diseased in multiple areas.  EF was 35-45% with moderately elevated LVEDP.  He returned for staged PCI of the RCA which was performed with 3 DES on 06/14/2018.  Echo in 08/2018 revealed EF 55-60%, no significant valvular abnormalities.  He was last seen in clinic 07/27/2021 and was doing well from a cardiac perspective without symptoms of angina.  He had reported a long history of  whitecoat hypertension with a blood pressure that had been well-controlled in his home setting.  There were no changes made to his medication regimen and no further testing at that time.  He presents the [facility] emergency department 09/12/2022 with hypotension.  He also reported 1 to 2 weeks of increased fatigue and just being more weak and not been eating or drinking as he usually had.  He also reported a chronic chest discomfort that was unchanged over the past couple of years.  And also noted he had a near syncopal event prior to arriving to the emergency department where his blood pressure was in the 50s at home.  On arrival blood pressure was 82/58 with a pulse of 78 respirations of 20.  CO2 is 18, blood glucose of 144, serum creatinine of 1.70 with estimated GFR of 48.  He was given IV fluids 2 L with improved status.  They discussed admission to the hospital for an echocardiogram to make sure there were no signs of stenosis and he declined saying that he would prefer to just go home.  His first troponin was negative and with improvement of symptoms he was discharged with ER precautions.  He returns to clinic today after being in in the emergency department and being advised to hold his lisinopril due to the new finding of AKI.  He was also encouraged to remain hydrated to help improve his kidney function.  He states send that he has not had any further near syncopal episodes or drop in his blood pressure since lisinopril has been held altogether and he  only takes his carvedilol on a daily basis.  He says he has had no nighttime medications for at least over the last year.  Does have whitecoat syndrome when he comes into the office with elevated blood pressures but blood pressures at home have remained relatively stable.  He endorses chronic chest discomfort right at the nipple line that is unchanged over the last several years and denies any other associated symptoms.  ROS: Negative with the exception of  what has been mentioned in the HPI  Studies Reviewed:    EKG: No tracings done today, EKG completed 09/13/2022 in the emergency department was reviewed which shows sinus rhythm with a rate of 75 but no acute changes that were noted from prior studies.  2D Echo 08/2018: 1. The left ventricle has normal systolic function, with an ejection fraction of 55-60%. The cavity size was normal. Left ventricular diastolic parameters were normal.  2. The right ventricle has normal systolic function. The cavity was normal. There is no increase in right ventricular wall thickness. Right ventricular systolic pressure is mildly elevated with an estimated pressure of 41.6 mmHg.  3. The mitral valve is grossly normal.  4. The tricuspid valve is grossly normal.  5. The aortic valve was not well visualized Aortic valve regurgitation was not assessed by color flow Doppler. __________   LHC 05/31/2018: Dist LM lesion is 50% stenosed. Ost LAD lesion is 60% stenosed. Prox LAD to Mid LAD lesion is 80% stenosed. Prox Cx lesion is 100% stenosed. Mid RCA-1 lesion is 40% stenosed. Mid RCA-2 lesion is 95% stenosed. Dist RCA lesion is 60% stenosed. Prox RCA lesion is 70% stenosed. Prox Graft to Mid Graft lesion is 100% stenosed. LIMA graft was visualized by non-selective angiography and is normal in caliber. The graft exhibits no disease. SVG. Origin lesion is 100% stenosed. LV end diastolic pressure is moderately elevated. The left ventricular ejection fraction is 35-45% by visual estimate. There is moderate left ventricular systolic dysfunction.   1.  Significant underlying three-vessel coronary artery disease with patent LIMA to LAD and patent SVG to ramus.  Occluded jump graft from SVG to OM 3 and SVG to right PDA.  The left circumflex is chronically occluded with collaterals.  The RCA is diffusely diseased in multiple areas. 2.  Moderately reduced LV systolic function moderately elevated left ventricular  end-diastolic pressure.   Recommendations: The best option might be RCA PCI but that will require multiple stents in order to reconstruct probably the whole artery.  This is best to be staged.  The patient might need a small dose diuretic. We will consider switching lisinopril to Entresto. __________   PCI 06/14/2018: Dist LM lesion is 50% stenosed. Ost LAD lesion is 60% stenosed. Prox LAD to Mid LAD lesion is 80% stenosed. Prox Cx lesion is 100% stenosed. Mid RCA-1 lesion is 40% stenosed. Dist RCA lesion is 85% stenosed. Prox RCA lesion is 70% stenosed. Mid RCA-2 lesion is 99% stenosed. Prox Graft to Mid Graft lesion is 100% stenosed. LIMA and is normal in caliber. The graft exhibits no disease. SVG. Origin lesion is 100% stenosed. Post intervention, there is a 0% residual stenosis. A drug-eluting stent was successfully placed using a [place] Part [id number]. A drug-eluting stent was successfully placed using a [place] [id number]. Post intervention, there is a 0% residual stenosis. Post intervention, there is a 0% residual stenosis. A drug-eluting stent was successfully placed using a [place] [id number]. Post intervention, there is a 0%  residual stenosis.   Successful complex angioplasty and 3 drug-eluting stent placement to the right coronary artery.   Recommendations: Dual antiplatelet therapy indefinitely if possible .  Aggressive treatment of risk factors and cardiac rehab  Risk Assessment/Calculations:     HYPERTENSION CONTROL Vitals:   09/19/22 1447 09/19/22 1504  BP: (!) 138/100 (!) 142/94    The patient's blood pressure is elevated above target today.  In order to address the patient's elevated BP: The blood pressure is usually elevated in clinic.  Blood pressures monitored at home have been optimal.           Physical Exam:   VS:  BP (!) 142/94 (BP Location: Left Arm, Patient Position: Sitting, Cuff Size: Normal)   Pulse 79   Ht 5'  8" (1.727 m)   Wt 224 lb 12.8 oz (102 kg)   SpO2 95%   BMI 34.18 kg/m    Wt Readings from Last 3 Encounters:  09/19/22 224 lb 12.8 oz (102 kg)  09/12/22 230 lb (104.3 kg)  07/27/21 233 lb (105.7 kg)     GEN: Well nourished, well developed in no acute distress NECK: No JVD; No carotid bruits CARDIAC: RRR, no murmurs, rubs, gallops RESPIRATORY:  Clear to auscultation without rales, wheezing or rhonchi  ABDOMEN: Soft, non-tender, non-distended EXTREMITIES:  No edema; No deformity   ASSESSMENT AND PLAN:   Near syncope with hypotension in the setting of AKI likely secondary to dehydration.  Patient's blood pressure has improved since being off of the lisinopril that was held on discharge from the emergency department.  Patient states he did cut his lisinopril in half and take it on 1 day since hospital discharge and it dropped his blood pressure again so he has not taken any since that time.  He has had no further episodes of hypotension or near syncope since being off of lisinopril.  He has been scheduled for an echocardiogram to rule out wall motion abnormality or stenosis.  His last study was completed in 2020.  Whitecoat hypertension with blood pressure today of 138/100 recheck of 142/94 which is a huge improvement of her previous blood pressures in clinic of [id number] systolic.  Currently he is only taking carvedilol 25 mg daily.  He has been encouraged to continue to keep a blood pressure log at home and bring it in with him the next time he comes as he states his blood pressure has been stable for an extended period of time.  He states that he has not had nighttime meds in over a year and is only taking his carvedilol once daily for quite some time we did discuss the benefit of carvedilol being taken twice a day and he can cut that tablet in half, unfortunately he is not interested in changing his medication regimen at this time or taking a different medication.  Recent hospitalization for AKI.   He was hydrated in the hospital and continues to state that he drinks large quantities of water throughout the day.  He has been sent for repeat being that today to reevaluate his electrolytes and kidney function.  Coronary artery disease status post CABG with stable angina.  Patient without changes in his angina at this time.  There is no ischemic changes noted on EKG in the emergency department.  High-sensitivity troponins were negative.  He is continued on aspirin 81 mg daily and clopidogrel 75 mg daily.  Again patient takes his carvedilol 25 mg daily and has taken his self off  of atorvastatin stating that his last lipid panel his cholesterol was at goal and he felt is that he no longer needed the medication.  HFrEF secondary to ICM with echocardiogram revealing normalization of LV systolic function after subsequent revascularization.  He continues to take carvedilol and is no longer on lisinopril.  Appears euvolemic on exam today.  He has been sent for bmet and scheduled for repeat echocardiogram.   Hyperlipidemia with LDL 54 in 2019.  He has been sent for lipid panel but he has taken his self off of atorvastatin.  Currently he has no desire to restart any medications.  He states that he is taking nitro.  5 tablets daily to improve his circulation and eating beats to help with his cholesterol.  We did advise him with his history of coronary artery disease what his goal needed to be as well and its prevention of progressive disease process and he was willing to have blood work done today.  Disposition patient return to clinic to see MD/APP after echocardiogram or sooner if needed.        Signed, Onnie Hatchel, NP

## 2022-09-19 NOTE — Patient Instructions (Signed)
Medication Instructions:  No changes at this time.   *If you need a refill on your cardiac medications before your next appointment, please call your pharmacy*   Lab Work: CMET, Lipid, Direct LDL over at the [facility]. Check in at registration desk.   If you have labs (blood work) drawn today and your tests are completely normal, you will receive your results only by: [place] (if you have MyChart) OR A paper copy in the mail If you have any lab test that is abnormal or we need to change your treatment, we will call you to review the results.   Testing/Procedures: Your physician has requested that you have an echocardiogram. Echocardiography is a painless test that uses sound waves to create images of your heart. It provides your doctor with information about the size and shape of your heart and how well your heart's chambers and valves are working. This procedure takes approximately one hour. There are no restrictions for this procedure. Please do NOT wear cologne, perfume, aftershave, or lotions (deodorant is allowed). Please arrive 15 minutes prior to your appointment time.    Follow-Up: At [facility], you and your health needs are our priority.  As part of our continuing mission to provide you with exceptional heart care, we have created designated Provider Care Teams.  These Care Teams include your primary Cardiologist (physician) and Advanced Practice Providers (APPs -  Physician Assistants and Nurse Practitioners) who all work together to provide you with the care you need, when you need it.   Your next appointment:   Follow up after echocardiogram  Provider:   Ida Rogue, MD or Gerrie Nordmann, NP

## 2022-10-07 ENCOUNTER — Telehealth: Payer: Self-pay | Admitting: Cardiovascular Disease

## 2022-10-07 NOTE — Telephone Encounter (Signed)
Patient canceled Echo test on 4/22 due to being out of town for work.  Patient stated he will call back to reschedule.

## 2022-10-10 ENCOUNTER — Ambulatory Visit: Payer: BC Managed Care – PPO

## 2022-10-14 ENCOUNTER — Ambulatory Visit: Payer: BC Managed Care – PPO | Admitting: Cardiology

## 2022-11-22 ENCOUNTER — Encounter: Payer: Self-pay | Admitting: Cardiology

## 2022-12-21 MED ORDER — CLOPIDOGREL BISULFATE 75 MG PO TABS: 75.0000 mg | ORAL_TABLET | Freq: Every day | ORAL | 3 refills | Status: DC

## 2022-12-21 NOTE — Telephone Encounter (Signed)
Patient requesting refill for Clopidogrel. Prescription sent to pharmacy.

## 2023-03-21 ENCOUNTER — Ambulatory Visit: Payer: Self-pay | Admitting: Internal Medicine

## 2023-04-25 ENCOUNTER — Ambulatory Visit: Payer: BC Managed Care – PPO | Admitting: Internal Medicine

## 2023-04-25 ENCOUNTER — Other Ambulatory Visit (HOSPITAL_COMMUNITY)
Admission: RE | Admit: 2023-04-25 | Discharge: 2023-04-25 | Disposition: A | Discharge status: Home / Self Care | Payer: BC Managed Care – PPO | Source: Ambulatory Visit | Attending: Internal Medicine | Admitting: Internal Medicine

## 2023-04-25 VITALS — BP 152/100 | HR 82 | Temp 98.3°F | Resp 18 | Ht 68.0 in | Wt 224.6 lb

## 2023-04-25 DIAGNOSIS — Z125 Encounter for screening for malignant neoplasm of prostate: Secondary | ICD-10-CM | POA: Diagnosis not present

## 2023-04-25 DIAGNOSIS — R7989 Other specified abnormal findings of blood chemistry: Secondary | ICD-10-CM | POA: Diagnosis not present

## 2023-04-25 DIAGNOSIS — Z1159 Encounter for screening for other viral diseases: Secondary | ICD-10-CM | POA: Diagnosis not present

## 2023-04-25 DIAGNOSIS — R361 Hematospermia: Secondary | ICD-10-CM | POA: Diagnosis not present

## 2023-04-25 DIAGNOSIS — I10 Essential (primary) hypertension: Secondary | ICD-10-CM

## 2023-04-25 DIAGNOSIS — R221 Localized swelling, mass and lump, neck: Secondary | ICD-10-CM

## 2023-04-25 DIAGNOSIS — I25119 Atherosclerotic heart disease of native coronary artery with unspecified angina pectoris: Secondary | ICD-10-CM

## 2023-04-25 DIAGNOSIS — Z1211 Encounter for screening for malignant neoplasm of colon: Secondary | ICD-10-CM

## 2023-04-25 DIAGNOSIS — Z789 Other specified health status: Secondary | ICD-10-CM

## 2023-04-25 LAB — POCT URINALYSIS DIPSTICK
Bilirubin, UA: NEGATIVE
Blood, UA: NEGATIVE
Glucose, UA: NEGATIVE
Ketones, UA: NEGATIVE
Leukocytes, UA: NEGATIVE
Nitrite, UA: NEGATIVE
Odor: NORMAL
Protein, UA: NEGATIVE
Spec Grav, UA: 1.01 (ref 1.010–1.025)
Urobilinogen, UA: 0.2 U/dL
pH, UA: 7.5 (ref 5.0–8.0)

## 2023-04-25 NOTE — Progress Notes (Signed)
New Patient Office Visit  Subjective    Patient ID: Edward Powell, male    DOB: 11/25/1970  Age: 52 y.o. MRN: [id number]  CC:  Chief Complaint  Patient presents with   Establish Care    cardiologist   blood ejactulation    5 episode and has not happened since denies pain   cramping in feet    HPI Edward Powell presents to establish care. He had not seen a PCP in some time.   Hypertension: -Medications: Coreg 25 mg written for twice a day, taking once a day - had been on Lisinopril but stopped due to low blood pressure readings  -Patient is compliant with above medications and reports no side effects. -Checking BP at home (average): 120-130/80 - states he has white coat HTN -Denies any SOB, CP, vision changes, LE edema or symptoms of hypotension  HLD/CAD/Hx of CABG x4: -Medications: Lipitor 80 mg (not taking - took for 2 months after MI but no longer), Plavix 75 mg, aspirin -Patient is compliant with above medications and reports no side effects.  -Last lipid panel: Lipid Panel     Component Value Date/Time   CHOL 135 03/06/2018 0845   TRIG 245 (H) 03/06/2018 0845   HDL 32 (L) 03/06/2018 0845   CHOLHDL 4.2 03/06/2018 0845   CHOLHDL 5.5 01/09/2018 0510   VLDL 53 (H) 01/09/2018 0510   LDLCALC 54 03/06/2018 0845   LABVLDL 49 (H) 03/06/2018 0845   -Hx of MI in 2019, s/p cardiac stents and CABG x 4  -Used to smoke but stopped in 2019  Alcohol Use: -Drinking on average 20 shots of vodka a week for about 1 year now -Does not need an eye-opener, no history of withdrawal   TSH was elevated at 18 back in March, patient was not put on medication at that time. Does have fatigue and swelling in his legs occasionally.   Health Maintenance: -Blood work due -Colon cancer screening due -Lung cancer screening due - patient declines at this time   Outpatient Encounter Medications as of 04/25/2023  Medication Sig   aspirin EC 81 MG EC tablet Take 1 tablet (81 mg total)  by mouth daily.   carvedilol (COREG) 25 MG tablet Take 1 tablet (25 mg total) by mouth 2 (two) times daily with a meal. (Patient taking differently: Take 25 mg by mouth daily.)   clopidogrel (PLAVIX) 75 MG tablet Take 1 tablet (75 mg total) by mouth daily.   atorvastatin (LIPITOR) 80 MG tablet Take 1 tablet (80 mg total) by mouth daily at 6 PM. (Patient not taking: Reported on 09/19/2022)   [DISCONTINUED] acetaminophen (TYLENOL) 325 MG tablet Take 2 tablets (650 mg total) by mouth every 6 (six) hours as needed for mild pain (or Fever >/= 101).   [DISCONTINUED] ibuprofen (ADVIL) 400 MG tablet Take 1 tablet (400 mg total) by mouth every 6 (six) hours as needed for mild pain.   [DISCONTINUED] oxyCODONE (OXY IR/ROXICODONE) 5 MG immediate release tablet Take 1 tablet (5 mg total) by mouth every 4 (four) hours as needed for severe pain. (Patient not taking: Reported on 09/19/2022)   No facility-administered encounter medications on file as of 04/25/2023.    Past Medical History:  Diagnosis Date   Alcohol abuse    CAD (coronary artery disease)    a. 12/2017 NSTEMI/Cath: LM 50d, LAD 60ost, 10m, RI nl, LCX 100p, OM2 fills via collats from LAD, RCA 70p, 100m, 51m, 60d, EF 55-65%; b. 12/2017 CABG  x 4: LIMA->LAD, VG->RI->LCX, VG->RPDA.   Diastolic dysfunction    a. 12/2017 Echo: EF 55-60%, no rwma, Gr1 DD, triv MR, nl RV fxn.   Essential hypertension    Hyperlipidemia LDL goal <70    Hypertriglyceridemia    Status post primary angioplasty with coronary stent 06/15/2018   Indefinite dual antiplatelet therapy per Dec 2019 note, Dr. Kirke Corin   Tobacco abuse     Past Surgical History:  Procedure Laterality Date   CORONARY ARTERY BYPASS GRAFT N/A 01/10/2018   Procedure: CORONARY ARTERY BYPASS GRAFTING (CABG);  Surgeon: Delight Ovens, MD;  Location: [facility] OR;  Service: Open Heart Surgery;  Laterality: N/A;  Times 4 using left internal mammary artery to the LAD and endoscopically harvested left saphenous vein to OM1,  OM2, and PLB   CORONARY STENT INTERVENTION N/A 06/14/2018   Procedure: CORONARY STENT INTERVENTION;  Surgeon: Iran Ouch, MD;  Location: [facility] INVASIVE CV LAB;  Service: Cardiovascular;  Laterality: N/A;   LEFT HEART CATH AND CORONARY ANGIOGRAPHY N/A 01/09/2018   Procedure: LEFT HEART CATH AND CORONARY ANGIOGRAPHY;  Surgeon: Yvonne Kendall, MD;  Location: [facility] INVASIVE CV LAB;  Service: Cardiovascular;  Laterality: N/A;   LEFT HEART CATH AND CORONARY ANGIOGRAPHY N/A 05/31/2018   Procedure: LEFT HEART CATH AND CORONARY ANGIOGRAPHY;  Surgeon: Iran Ouch, MD;  Location: [facility] INVASIVE CV LAB;  Service: Cardiovascular;  Laterality: N/A;   NO PAST SURGERIES     TEE WITHOUT CARDIOVERSION N/A 01/10/2018   Procedure: TRANSESOPHAGEAL ECHOCARDIOGRAM (TEE);  Surgeon: Delight Ovens, MD;  Location: [facility] OR;  Service: Open Heart Surgery;  Laterality: N/A;    Family History  Problem Relation Age of Onset   Hypertension Mother    Breast cancer Mother    Hyperlipidemia Mother    Liver cancer Father    Lung cancer Father    Hypertension Father    Atrial fibrillation Father    Stroke Brother    Hypertension Brother    Hyperlipidemia Brother    Heart attack Maternal Grandmother    Stroke Maternal Grandmother    Hyperlipidemia Maternal Grandmother    Hypertension Maternal Grandmother    Stroke Paternal Grandmother    Alzheimer's disease Paternal Grandmother    Hypertension Paternal Grandmother     Social History   Socioeconomic History   Marital status: Married    Spouse name: Not on file   Number of children: Not on file   Years of education: 12   Highest education level: Bachelor's degree (e.g., BA, AB, BS)  Occupational History   Not on file  Tobacco Use   Smoking status: Former    Current packs/day: 1.00    Average packs/day: 1 pack/day for 30.0 years (30.0 ttl pk-yrs)    Types: Cigarettes   Smokeless tobacco: Never   Tobacco comments:    last cigarette was day of the  heart attack  Vaping Use   Vaping status: Never Used  Substance and Sexual Activity   Alcohol use: Yes    Alcohol/week: 9.0 standard drinks of alcohol    Types: 4 Cans of beer, 5 Shots of liquor per week    Comment: up to 7-9 shots twice weekly - vodka   Drug use: Not Currently   Sexual activity: Yes  Other Topics Concern   Not on file  Social History Narrative   ** Merged History Encounter **       Lives in [place] with significant other.  No children.  Does not routinely exercise.  Social Determinants of Health   Financial Resource Strain: Low Risk  (04/25/2023)   Overall Financial Resource Strain (CARDIA)    Difficulty of Paying Living Expenses: Not very hard  Food Insecurity: No Food Insecurity (04/25/2023)   Hunger Vital Sign    Worried About Running Out of Food in the Last Year: Never true    Ran Out of Food in the Last Year: Never true  Transportation Needs: No Transportation Needs (04/25/2023)   PRAPARE - Administrator, Civil Service (Medical): No    Lack of Transportation (Non-Medical): No  Physical Activity: Unknown (04/25/2023)   Exercise Vital Sign    Days of Exercise per Week: 0 days    Minutes of Exercise per Session: Not on file  Stress: No Stress Concern Present (04/25/2023)   [employer] of Occupational Health - Occupational Stress Questionnaire    Feeling of Stress : Only a little  Social Connections: Moderately Isolated (04/25/2023)   Social Connection and Isolation Panel [NHANES]    Frequency of Communication with Friends and Family: Twice a week    Frequency of Social Gatherings with Friends and Family: Once a week    Attends Religious Services: Never    Database administrator or Organizations: No    Attends Engineer, structural: Not on file    Marital Status: Married  Intimate Partner Violence: Unknown (01/10/2018)   Humiliation, Afraid, Rape, and Kick questionnaire    Fear of Current or Ex-Partner: Patient declined     Emotionally Abused: Patient declined    Physically Abused: Patient declined    Sexually Abused: Patient declined    Review of Systems  Constitutional:  Positive for malaise/fatigue. Negative for chills and fever.  Respiratory:  Negative for cough and shortness of breath.   Cardiovascular:  Positive for leg swelling. Negative for chest pain.        Objective    BP (!) 162/110   Pulse 82   Temp 98.3 F (36.8 C)   Resp 18   Ht 5\' 8"  (1.727 m)   Wt 224 lb 9.6 oz (101.9 kg)   SpO2 (!) 67%   BMI 34.15 kg/m   Physical Exam Constitutional:      Appearance: Normal appearance.  HENT:     Head: Normocephalic and atraumatic.     Mouth/Throat:     Mouth: Mucous membranes are moist.     Pharynx: Oropharynx is clear.  Eyes:     Extraocular Movements: Extraocular movements intact.     Conjunctiva/sclera: Conjunctivae normal.     Pupils: Pupils are equal, round, and reactive to light.  Neck:     Comments: Large soft tissue mass on the left anterior cervical region, non-tender  Cardiovascular:     Rate and Rhythm: Normal rate and regular rhythm.  Pulmonary:     Effort: Pulmonary effort is normal.     Breath sounds: Normal breath sounds.  Musculoskeletal:     Cervical back: No tenderness.     Right lower leg: No edema.     Left lower leg: No edema.  Lymphadenopathy:     Cervical: Cervical adenopathy present.  Skin:    General: Skin is warm and dry.  Neurological:     General: No focal deficit present.     Mental Status: He is alert. Mental status is at baseline.  Psychiatric:        Mood and Affect: Mood normal.        Behavior: Behavior normal.  Assessment & Plan:   1. Essential hypertension: Blood pressure elevated. Patient is supposed to be taking Coreg 25 mg BID but is only taking it once daily. States BP ok at home - will obtain labs and continue to monitor.   - CBC w/Diff/Platelet - COMPLETE METABOLIC PANEL WITH GFR  2. Coronary artery disease  involving native heart with angina pectoris, unspecified vessel or lesion type [facility]): Compliant with Plavix and aspirin, not taking statin. Will repeat labs. Discussed risk of not taking medication as directed. Also discussed diet and lifestyle.   - Lipid Profile  3. Elevated TSH: Repeat labs.   - Thyroid Panel With TSH  4. Hematospermia: Happened 5 times in a row back in September but hasn't occurred since. Is on Plavix, will rule out infection today.   - POCT Urinalysis Dipstick - Urine cytology ancillary only  5. Localized swelling, mass and lump, neck: Obtain US for further evaluation.   - US Soft Tissue Head/Neck (NON-THYROID); Future  6. Alcohol use: Liver enzymes elevated earlier this year, will check some vitamins today. Patient not interested in cessation at this time.   - Vitamin B1 - B12 and Folate Panel  7. Encounter for hepatitis C screening test for low risk patient/Prostate cancer screening: Screening ordered.   - Hepatitis C Antibody - PSA  8. Screening for colon cancer: Cologuard ordered.   - Cologuard   Return for TBD after labs.   Margarita Mail, DO

## 2023-04-25 NOTE — Patient Instructions (Addendum)
It was great seeing you today!  Plan discussed at today's visit: -Blood work ordered today, results will be uploaded to MyChart.  -Urine test today -Ultrasound ordered  -Cologuard ordered for colon cancer screening   Follow up in: TBD after labs   Take care and let us know if you have any questions or concerns prior to your next visit.  Dr. Caralee Ates

## 2023-04-26 MED ORDER — LEVOTHYROXINE SODIUM 25 MCG PO TABS: 25.0000 ug | ORAL_TABLET | Freq: Every day | ORAL | 1 refills | Status: DC

## 2023-04-26 NOTE — Addendum Note (Signed)
Addended by: Margarita Mail on: 04/26/2023 11:31 AM   Modules accepted: Orders

## 2023-04-27 LAB — URINE CYTOLOGY ANCILLARY ONLY
Chlamydia: NEGATIVE
Comment: NEGATIVE
Comment: NORMAL
Neisseria Gonorrhea: NEGATIVE

## 2023-04-30 LAB — LIPID PANEL
Cholesterol: 312 mg/dL — ABNORMAL HIGH (ref <200)
HDL: 19 mg/dL — ABNORMAL LOW (ref >=40)
LDL Cholesterol (Calc): 249 mg/dL — ABNORMAL HIGH
Non-HDL Cholesterol (Calc): 293 mg/dL — ABNORMAL HIGH (ref <130)
Total CHOL/HDL Ratio: 16.4 (calc) — ABNORMAL HIGH (ref <5.0)
Triglycerides: 238 mg/dL — ABNORMAL HIGH (ref <150)

## 2023-04-30 LAB — COMPLETE METABOLIC PANEL WITH GFR
AG Ratio: 0.8 (calc) — ABNORMAL LOW (ref 1.0–2.5)
ALT: 64 U/L — ABNORMAL HIGH (ref 9–46)
AST: 175 U/L — ABNORMAL HIGH (ref 10–35)
Albumin: 3.6 g/dL (ref 3.6–5.1)
Alkaline phosphatase (APISO): 213 U/L — ABNORMAL HIGH (ref 35–144)
BUN/Creatinine Ratio: 9 (calc) (ref 6–22)
BUN: 6 mg/dL — ABNORMAL LOW (ref 7–25)
CO2: 30 mmol/L (ref 20–32)
Calcium: 9 mg/dL (ref 8.6–10.3)
Chloride: 96 mmol/L — ABNORMAL LOW (ref 98–110)
Creat: 0.66 mg/dL — ABNORMAL LOW (ref 0.70–1.30)
Globulin: 4.7 g/dL — ABNORMAL HIGH (ref 1.9–3.7)
Glucose, Bld: 126 mg/dL — ABNORMAL HIGH (ref 65–99)
Potassium: 4.2 mmol/L (ref 3.5–5.3)
Sodium: 136 mmol/L (ref 135–146)
Total Bilirubin: 1.1 mg/dL (ref 0.2–1.2)
Total Protein: 8.3 g/dL — ABNORMAL HIGH (ref 6.1–8.1)
eGFR: 112 mL/min/{1.73_m2} (ref >=60)

## 2023-04-30 LAB — CBC WITH DIFFERENTIAL/PLATELET
Absolute Lymphocytes: 1112 {cells}/uL (ref 850–3900)
Absolute Monocytes: 1129 {cells}/uL — ABNORMAL HIGH (ref 200–950)
Basophils Absolute: 100 {cells}/uL (ref 0–200)
Basophils Relative: 1.2 %
Eosinophils Absolute: 91 {cells}/uL (ref 15–500)
Eosinophils Relative: 1.1 %
HCT: 41.6 % (ref 38.5–50.0)
Hemoglobin: 14.1 g/dL (ref 13.2–17.1)
MCH: 32 pg (ref 27.0–33.0)
MCHC: 33.9 g/dL (ref 32.0–36.0)
MCV: 94.3 fL (ref 80.0–100.0)
MPV: 12.1 fL (ref 7.5–12.5)
Monocytes Relative: 13.6 %
Neutro Abs: 5868 {cells}/uL (ref 1500–7800)
Neutrophils Relative %: 70.7 %
Platelets: 180 10*3/uL (ref 140–400)
RBC: 4.41 10*6/uL (ref 4.20–5.80)
RDW: 14 % (ref 11.0–15.0)
Total Lymphocyte: 13.4 %
WBC: 8.3 10*3/uL (ref 3.8–10.8)

## 2023-04-30 LAB — PSA: PSA: 1.01 ng/mL (ref <=4.00)

## 2023-04-30 LAB — THYROID PANEL WITH TSH
Free Thyroxine Index: 2.2 (ref 1.4–3.8)
T3 Uptake: 29 % (ref 22–35)
T4, Total: 7.7 ug/dL (ref 4.9–10.5)
TSH: 15.17 m[IU]/L — ABNORMAL HIGH (ref 0.40–4.50)

## 2023-04-30 LAB — HEPATITIS C ANTIBODY: Hepatitis C Ab: NONREACTIVE

## 2023-04-30 LAB — ADVANCED WRITTEN NOTIFICATION (AWN) TEST REFUSAL: AWN TEST REFUSED: 7065

## 2023-04-30 LAB — VITAMIN B1: Vitamin B1 (Thiamine): 6 nmol/L — ABNORMAL LOW (ref 8–30)

## 2023-05-02 ENCOUNTER — Other Ambulatory Visit: Payer: Self-pay

## 2023-05-02 MED ORDER — CARVEDILOL 25 MG PO TABS: 25.0000 mg | ORAL_TABLET | Freq: Two times a day (BID) | ORAL | 3 refills | Status: DC

## 2023-05-22 ENCOUNTER — Emergency Department: Payer: BC Managed Care – PPO

## 2023-05-22 ENCOUNTER — Other Ambulatory Visit: Payer: Self-pay

## 2023-05-22 ENCOUNTER — Observation Stay
Admission: EM | Admit: 2023-05-22 | Discharge: 2023-05-24 | Disposition: A | Discharge status: Home / Self Care | Payer: BC Managed Care – PPO | Attending: Obstetrics and Gynecology | Admitting: Obstetrics and Gynecology

## 2023-05-22 DIAGNOSIS — R7989 Other specified abnormal findings of blood chemistry: Secondary | ICD-10-CM | POA: Diagnosis not present

## 2023-05-22 DIAGNOSIS — E785 Hyperlipidemia, unspecified: Secondary | ICD-10-CM | POA: Diagnosis present

## 2023-05-22 DIAGNOSIS — I700 Atherosclerosis of aorta: Secondary | ICD-10-CM | POA: Diagnosis not present

## 2023-05-22 DIAGNOSIS — I25118 Atherosclerotic heart disease of native coronary artery with other forms of angina pectoris: Secondary | ICD-10-CM | POA: Diagnosis not present

## 2023-05-22 DIAGNOSIS — Z79899 Other long term (current) drug therapy: Secondary | ICD-10-CM | POA: Diagnosis not present

## 2023-05-22 DIAGNOSIS — Z87891 Personal history of nicotine dependence: Secondary | ICD-10-CM | POA: Insufficient documentation

## 2023-05-22 DIAGNOSIS — F1090 Alcohol use, unspecified, uncomplicated: Secondary | ICD-10-CM | POA: Diagnosis not present

## 2023-05-22 DIAGNOSIS — Z7901 Long term (current) use of anticoagulants: Secondary | ICD-10-CM | POA: Insufficient documentation

## 2023-05-22 DIAGNOSIS — R0602 Shortness of breath: Secondary | ICD-10-CM | POA: Diagnosis not present

## 2023-05-22 DIAGNOSIS — R040 Epistaxis: Secondary | ICD-10-CM | POA: Diagnosis not present

## 2023-05-22 DIAGNOSIS — E039 Hypothyroidism, unspecified: Secondary | ICD-10-CM | POA: Insufficient documentation

## 2023-05-22 DIAGNOSIS — I25119 Atherosclerotic heart disease of native coronary artery with unspecified angina pectoris: Secondary | ICD-10-CM | POA: Diagnosis not present

## 2023-05-22 DIAGNOSIS — R7401 Elevation of levels of liver transaminase levels: Secondary | ICD-10-CM | POA: Diagnosis not present

## 2023-05-22 DIAGNOSIS — K7010 Alcoholic hepatitis without ascites: Secondary | ICD-10-CM | POA: Insufficient documentation

## 2023-05-22 DIAGNOSIS — Z951 Presence of aortocoronary bypass graft: Secondary | ICD-10-CM

## 2023-05-22 DIAGNOSIS — I10 Essential (primary) hypertension: Secondary | ICD-10-CM | POA: Diagnosis present

## 2023-05-22 DIAGNOSIS — I2510 Atherosclerotic heart disease of native coronary artery without angina pectoris: Secondary | ICD-10-CM | POA: Diagnosis present

## 2023-05-22 DIAGNOSIS — R7309 Other abnormal glucose: Secondary | ICD-10-CM | POA: Insufficient documentation

## 2023-05-22 DIAGNOSIS — R079 Chest pain, unspecified: Secondary | ICD-10-CM | POA: Diagnosis present

## 2023-05-22 DIAGNOSIS — E782 Mixed hyperlipidemia: Secondary | ICD-10-CM

## 2023-05-22 DIAGNOSIS — R0789 Other chest pain: Principal | ICD-10-CM | POA: Insufficient documentation

## 2023-05-22 DIAGNOSIS — R0609 Other forms of dyspnea: Secondary | ICD-10-CM

## 2023-05-22 DIAGNOSIS — K760 Fatty (change of) liver, not elsewhere classified: Secondary | ICD-10-CM | POA: Diagnosis not present

## 2023-05-22 HISTORY — DX: Pulmonary hypertension, unspecified: I27.20

## 2023-05-22 HISTORY — DX: Ischemic cardiomyopathy: I25.5

## 2023-05-22 LAB — TROPONIN I (HIGH SENSITIVITY)
Troponin I (High Sensitivity): 17 ng/L (ref <18)
Troponin I (High Sensitivity): 15 ng/L (ref <18)

## 2023-05-22 LAB — HEPATIC FUNCTION PANEL
ALT: 54 U/L — ABNORMAL HIGH (ref 0–44)
AST: 209 U/L — ABNORMAL HIGH (ref 15–41)
Albumin: 2.8 g/dL — ABNORMAL LOW (ref 3.5–5.0)
Alkaline Phosphatase: 236 U/L — ABNORMAL HIGH (ref 38–126)
Bilirubin, Direct: 0.7 mg/dL — ABNORMAL HIGH (ref 0.0–0.2)
Indirect Bilirubin: 1.3 mg/dL — ABNORMAL HIGH (ref 0.3–0.9)
Total Bilirubin: 2 mg/dL — ABNORMAL HIGH (ref <1.2)
Total Protein: 7.8 g/dL (ref 6.5–8.1)

## 2023-05-22 LAB — BASIC METABOLIC PANEL
Anion gap: 12 (ref 5–15)
BUN: 9 mg/dL (ref 6–20)
CO2: 23 mmol/L (ref 22–32)
Calcium: 8.7 mg/dL — ABNORMAL LOW (ref 8.9–10.3)
Chloride: 100 mmol/L (ref 98–111)
Creatinine, Ser: 0.62 mg/dL (ref 0.61–1.24)
GFR, Estimated: >60 mL/min (ref >60)
Glucose, Bld: 149 mg/dL — ABNORMAL HIGH (ref 70–99)
Potassium: 3.5 mmol/L (ref 3.5–5.1)
Sodium: 135 mmol/L (ref 135–145)

## 2023-05-22 LAB — CBC
HCT: 37.4 % — ABNORMAL LOW (ref 39.0–52.0)
Hemoglobin: 12.7 g/dL — ABNORMAL LOW (ref 13.0–17.0)
MCH: 31.9 pg (ref 26.0–34.0)
MCHC: 34 g/dL (ref 30.0–36.0)
MCV: 94 fL (ref 80.0–100.0)
Platelets: 127 10*3/uL — ABNORMAL LOW (ref 150–400)
RBC: 3.98 MIL/uL — ABNORMAL LOW (ref 4.22–5.81)
RDW: 15.6 % — ABNORMAL HIGH (ref 11.5–15.5)
WBC: 8.2 10*3/uL (ref 4.0–10.5)
nRBC: 0 % (ref 0.0–0.2)

## 2023-05-22 LAB — TSH: TSH: 23.27 u[IU]/mL — ABNORMAL HIGH (ref 0.350–4.500)

## 2023-05-22 MED ORDER — THIAMINE HCL 100 MG/ML IJ SOLN
100.0000 mg | Freq: Every day | INTRAMUSCULAR | Status: DC
  Administered 2023-05-22: 100 mg via INTRAVENOUS
  Filled 2023-05-22 (×2): qty 2

## 2023-05-22 MED ORDER — LEVOTHYROXINE SODIUM 50 MCG PO TABS
25.0000 ug | ORAL_TABLET | Freq: Every day | ORAL | Status: DC
Start: 2023-05-23 — End: 2023-05-24
  Administered 2023-05-23 – 2023-05-24 (×2): 25 ug via ORAL
  Filled 2023-05-22 (×2): qty 1

## 2023-05-22 MED ORDER — ATORVASTATIN CALCIUM 20 MG PO TABS
40.0000 mg | ORAL_TABLET | Freq: Every day | ORAL | Status: DC
  Administered 2023-05-23 (×2): 40 mg via ORAL
  Filled 2023-05-22 (×2): qty 2

## 2023-05-22 MED ORDER — ACETAMINOPHEN 325 MG PO TABS: 650.0000 mg | ORAL_TABLET | ORAL | Status: DC | PRN

## 2023-05-22 MED ORDER — ASPIRIN 81 MG PO CHEW
243.0000 mg | CHEWABLE_TABLET | Freq: Once | ORAL | Status: AC
  Administered 2023-05-22: 243 mg via ORAL
  Filled 2023-05-22: qty 3

## 2023-05-22 MED ORDER — LORAZEPAM 2 MG/ML IJ SOLN
0.5000 mg | Freq: Once | INTRAMUSCULAR | Status: AC
  Administered 2023-05-22: 0.5 mg via INTRAVENOUS
  Filled 2023-05-22: qty 1

## 2023-05-22 MED ORDER — ONDANSETRON HCL 4 MG/2ML IJ SOLN
4.0000 mg | Freq: Once | INTRAMUSCULAR | Status: AC
Start: 2023-05-22 — End: 2023-05-22
  Administered 2023-05-22: 4 mg via INTRAVENOUS
  Filled 2023-05-22: qty 2

## 2023-05-22 MED ORDER — NITROGLYCERIN 0.4 MG SL SUBL: 0.4000 mg | SUBLINGUAL_TABLET | SUBLINGUAL | Status: DC | PRN

## 2023-05-22 MED ORDER — OXYMETAZOLINE HCL 0.05 % NA SOLN
1.0000 | Freq: Once | NASAL | Status: AC
  Administered 2023-05-22: 1 via NASAL
  Filled 2023-05-22: qty 30

## 2023-05-22 MED ORDER — MORPHINE SULFATE (PF) 4 MG/ML IV SOLN
4.0000 mg | Freq: Once | INTRAVENOUS | Status: AC
  Administered 2023-05-22: 4 mg via INTRAVENOUS
  Filled 2023-05-22: qty 1

## 2023-05-22 MED ORDER — THIAMINE MONONITRATE 100 MG PO TABS
100.0000 mg | ORAL_TABLET | Freq: Every day | ORAL | Status: DC
  Administered 2023-05-23 – 2023-05-24 (×3): 100 mg via ORAL
  Filled 2023-05-22 (×3): qty 1

## 2023-05-22 MED ORDER — ONDANSETRON HCL 4 MG/2ML IJ SOLN
4.0000 mg | Freq: Four times a day (QID) | INTRAMUSCULAR | Status: DC | PRN
  Administered 2023-05-22: 4 mg via INTRAVENOUS
  Filled 2023-05-22: qty 2

## 2023-05-22 MED ORDER — ASPIRIN 81 MG PO TBEC: 81.0000 mg | DELAYED_RELEASE_TABLET | Freq: Every day | ORAL | Status: DC

## 2023-05-22 MED ORDER — LORAZEPAM 1 MG PO TABS
1.0000 mg | ORAL_TABLET | ORAL | Status: DC | PRN
  Administered 2023-05-23: 1 mg via ORAL
  Filled 2023-05-22: qty 1

## 2023-05-22 MED ORDER — ADULT MULTIVITAMIN W/MINERALS CH
1.0000 | ORAL_TABLET | Freq: Every day | ORAL | Status: DC
  Administered 2023-05-23 – 2023-05-24 (×2): 1 via ORAL
  Filled 2023-05-22 (×2): qty 1

## 2023-05-22 MED ORDER — CLOPIDOGREL BISULFATE 75 MG PO TABS
75.0000 mg | ORAL_TABLET | Freq: Every day | ORAL | Status: DC
  Administered 2023-05-23 – 2023-05-24 (×3): 75 mg via ORAL
  Filled 2023-05-22 (×3): qty 1

## 2023-05-22 MED ORDER — ENOXAPARIN SODIUM 60 MG/0.6ML IJ SOSY
0.5000 mg/kg | PREFILLED_SYRINGE | INTRAMUSCULAR | Status: DC
  Administered 2023-05-22 – 2023-05-23 (×2): 50 mg via SUBCUTANEOUS
  Filled 2023-05-22 (×2): qty 0.6

## 2023-05-22 MED ORDER — OXYCODONE-ACETAMINOPHEN 5-325 MG PO TABS
1.0000 | ORAL_TABLET | Freq: Once | ORAL | Status: AC
  Administered 2023-05-22: 1 via ORAL
  Filled 2023-05-22: qty 1

## 2023-05-22 MED ORDER — FOLIC ACID 1 MG PO TABS
1.0000 mg | ORAL_TABLET | Freq: Every day | ORAL | Status: DC
Start: 2023-05-22 — End: 2023-05-24
  Administered 2023-05-23 – 2023-05-24 (×3): 1 mg via ORAL
  Filled 2023-05-22 (×3): qty 1

## 2023-05-22 MED ORDER — LORAZEPAM 2 MG/ML IJ SOLN: 1.0000 mg | INTRAMUSCULAR | Status: DC | PRN

## 2023-05-22 MED ORDER — IOHEXOL 350 MG/ML SOLN
100.0000 mL | Freq: Once | INTRAVENOUS | Status: AC | PRN
  Administered 2023-05-22: 100 mL via INTRAVENOUS

## 2023-05-22 MED ORDER — CARVEDILOL 12.5 MG PO TABS
12.5000 mg | ORAL_TABLET | Freq: Two times a day (BID) | ORAL | Status: DC
  Administered 2023-05-23 – 2023-05-24 (×3): 12.5 mg via ORAL
  Filled 2023-05-22: qty 1
  Filled 2023-05-22 (×2): qty 2

## 2023-05-22 NOTE — Progress Notes (Signed)
Anticoagulation monitoring(Lovenox):  52 yo male ordered Lovenox 40 mg Q24h    Filed Weights   05/22/23 1356  Weight: 101.9 kg (224 lb 10.4 oz)   BMI 34.16   Lab Results  Component Value Date   CREATININE 0.62 05/22/2023   CREATININE 0.66 (L) 04/25/2023   CREATININE 1.70 (H) 09/12/2022   Estimated Creatinine Clearance: 123.6 mL/min (by C-G formula based on SCr of 0.62 mg/dL). Hemoglobin & Hematocrit     Component Value Date/Time   HGB 12.7 (L) 05/22/2023 1408   HGB 14.2 05/29/2018 1347   HCT 37.4 (L) 05/22/2023 1408   HCT 40.7 05/29/2018 1347     Per Protocol for Patient with estCrcl > 30 ml/min and BMI > 30, will transition to Lovenox 50 mg Q24h.

## 2023-05-22 NOTE — Assessment & Plan Note (Signed)
History of CAD s/p CABG in 2019 with subsequent PCI with DES to RCA x 3.  Please see management of atypical chest pain as noted above  - Cardiology consulted; appreciate their recommendations - Aspirin, Plavix and statin as noted above - Echocardiogram ordered

## 2023-05-22 NOTE — H&P (Signed)
History and Physical    Patient: Edward Powell WGN:[id number] DOB: [birth year] DOA: 05/22/2023 DOS: the patient was seen and examined on 05/22/2023 PCP: Margarita Mail, DO  Patient coming from: Home  Chief Complaint:  Chief Complaint  Patient presents with   Chest Pain   HPI: Edward Powell is a 52 y.o. male with medical history significant of CAD s/p CABG in July 2019 followed by subsequent PCI, HFrEF with recovered EF 2/2 ischemic cardiomyopathy (last echo March 2020), severe hyperlipidemia, hypertension, alcohol use disorder, who presents to the ED due to nosebleeds, shortness of breath on exertion, and chest pain.  Edward Powell states that for the last 3-4 days, he has been experiencing an intermittent nosebleed that stopped intermittently but always restarts.  He has been experiencing some nasal discomfort due to this.  Then today, he noticed that he was having significant shortness of breath on exertion.  He was unable to climb his usual steps to his apartment without having to take a break.  He decided to come to the ER, and since arriving here he began to experience substernal left-sided chest pain without radiation.  It seems that lifting his arm and palpating makes it worse occasionally.  He denies any palpitations, lower extremity swelling or orthopnea.  Edward Powell states that he has been drinking more over the past 1 year since the death of his mother last Christmas.  He states he does not drink every day, but has been binging more often.  His wife at bedside notes that he has also become much more sedentary and does not leave the apartment very often.  ED course: On arrival to the ED, patient was hypertensive at 180/118 with heart rate of 74.  He was saturating at 95% on room air.  He was afebrile at 97.6.  Initial workup was notable for WBC of 12.7, platelets of 127, glucose of 149, creatinine 0.62 with GFR above 60.  Troponin negative x 2.  CTA was obtained with no acute  findings.  Due to high risk features, [facility] contacted for admission.  Review of Systems: As mentioned in the history of present illness. All other systems reviewed and are negative.  Past Medical History:  Diagnosis Date   Alcohol abuse    CAD (coronary artery disease)    a. 12/2017 NSTEMI/Cath: LM 50d, LAD 60ost, 63m, RI nl, LCX 100p, OM2 fills via collats from LAD, RCA 70p, 18m, 38m, 60d, EF 55-65%; b. 12/2017 CABG x 4: LIMA->LAD, VG->RI->LCX, VG->RPDA.   Diastolic dysfunction    a. 12/2017 Echo: EF 55-60%, no rwma, Gr1 DD, triv MR, nl RV fxn.   Essential hypertension    Hyperlipidemia LDL goal <70    Hypertriglyceridemia    Status post primary angioplasty with coronary stent 06/15/2018   Indefinite dual antiplatelet therapy per Dec 2019 note, Dr. Kirke Corin   Tobacco abuse    Past Surgical History:  Procedure Laterality Date   CORONARY ARTERY BYPASS GRAFT N/A 01/10/2018   Procedure: CORONARY ARTERY BYPASS GRAFTING (CABG);  Surgeon: Delight Ovens, MD;  Location: [facility] OR;  Service: Open Heart Surgery;  Laterality: N/A;  Times 4 using left internal mammary artery to the LAD and endoscopically harvested left saphenous vein to OM1, OM2, and PLB   CORONARY STENT INTERVENTION N/A 06/14/2018   Procedure: CORONARY STENT INTERVENTION;  Surgeon: Iran Ouch, MD;  Location: [facility] INVASIVE CV LAB;  Service: Cardiovascular;  Laterality: N/A;   LEFT HEART CATH AND CORONARY ANGIOGRAPHY N/A 01/09/2018  Procedure: LEFT HEART CATH AND CORONARY ANGIOGRAPHY;  Surgeon: Yvonne Kendall, MD;  Location: [facility] INVASIVE CV LAB;  Service: Cardiovascular;  Laterality: N/A;   LEFT HEART CATH AND CORONARY ANGIOGRAPHY N/A 05/31/2018   Procedure: LEFT HEART CATH AND CORONARY ANGIOGRAPHY;  Surgeon: Iran Ouch, MD;  Location: [facility] INVASIVE CV LAB;  Service: Cardiovascular;  Laterality: N/A;   NO PAST SURGERIES     TEE WITHOUT CARDIOVERSION N/A 01/10/2018   Procedure: TRANSESOPHAGEAL ECHOCARDIOGRAM (TEE);  Surgeon:  Delight Ovens, MD;  Location: [facility] OR;  Service: Open Heart Surgery;  Laterality: N/A;   Social History:  reports that he has quit smoking. His smoking use included cigarettes. He has a 30 pack-year smoking history. He has never used smokeless tobacco. He reports current alcohol use of about 9.0 standard drinks of alcohol per week. He reports that he does not currently use drugs.  No Known Allergies  Family History  Problem Relation Age of Onset   Hypertension Mother    Breast cancer Mother    Hyperlipidemia Mother    Liver cancer Father    Lung cancer Father    Hypertension Father    Atrial fibrillation Father    Stroke Brother    Hypertension Brother    Hyperlipidemia Brother    Heart attack Maternal Grandmother    Stroke Maternal Grandmother    Hyperlipidemia Maternal Grandmother    Hypertension Maternal Grandmother    Stroke Paternal Grandmother    Alzheimer's disease Paternal Grandmother    Hypertension Paternal Grandmother     Prior to Admission medications   Medication Sig Start Date End Date Taking? Authorizing Provider  aspirin EC 81 MG EC tablet Take 1 tablet (81 mg total) by mouth daily. 01/09/18   Alford Highland, MD  atorvastatin (LIPITOR) 80 MG tablet Take 1 tablet (80 mg total) by mouth daily at 6 PM. Patient not taking: Reported on 09/19/2022 07/27/21   Sondra Barges, PA-C  carvedilol (COREG) 25 MG tablet Take 1 tablet (25 mg total) by mouth 2 (two) times daily with a meal. 05/02/23   Dunn, Raymon Mutton, PA-C  clopidogrel (PLAVIX) 75 MG tablet Take 1 tablet (75 mg total) by mouth daily. 12/21/22   Dunn, Raymon Mutton, PA-C  levothyroxine (SYNTHROID) 25 MCG tablet Take 1 tablet (25 mcg total) by mouth daily. 04/26/23   Margarita Mail, DO    Physical Exam: Vitals:   05/22/23 1926 05/22/23 2030 05/22/23 2045 05/22/23 2100  BP: (!) 184/99 (!) 169/97  (!) 180/98  Pulse: 73 61 68 73  Resp: 15 18 14 13   Temp:      TempSrc:      SpO2: 97% 93% 95% 96%  Weight:      Height:        Physical Exam Vitals and nursing note reviewed.  Constitutional:      General: He is not in acute distress.    Appearance: He is obese.  HENT:     Head: Normocephalic and atraumatic.     Mouth/Throat:     Mouth: Mucous membranes are moist.  Eyes:     Conjunctiva/sclera: Conjunctivae normal.  Cardiovascular:     Rate and Rhythm: Normal rate and regular rhythm.     Heart sounds: No murmur heard. Pulmonary:     Effort: Pulmonary effort is normal. No respiratory distress.     Breath sounds: Normal breath sounds. No wheezing, rhonchi or rales.  Abdominal:     Palpations: Abdomen is soft.  Tenderness: There is abdominal tenderness (Overlying hernia).     Hernia: A hernia is present. Hernia is present in the umbilical area and ventral area.  Musculoskeletal:     Comments: Trace bilateral pitting edema  Skin:    General: Skin is warm and dry.  Neurological:     General: No focal deficit present.     Mental Status: He is alert and oriented to person, place, and time.     Comments: Fine resting tremor noted  Psychiatric:        Mood and Affect: Mood normal.        Behavior: Behavior normal.    Data Reviewed: CBC with WBC of 8.2, hemoglobin of 12.7, platelets of 127 BMP with sodium of 135, potassium 3.5, bicarb 23, glucose 149, BUN 9, creatinine 0.62, GFR above 60 Troponin 17 and then 15  EKG personally reviewed.  Sinus rhythm with rate of 82.  Right axis deviation.  Borderline QT prolongation.  No ST or T wave changes concerning for acute ischemia.  CT Angio Chest PE W/Cm &/Or Wo Cm  Result Date: 05/22/2023 CLINICAL DATA:  Concern for pulmonary embolism. EXAM: CT ANGIOGRAPHY CHEST WITH CONTRAST TECHNIQUE: Multidetector CT imaging of the chest was performed using the standard protocol during bolus administration of intravenous contrast. Multiplanar CT image reconstructions and MIPs were obtained to evaluate the vascular anatomy. RADIATION DOSE REDUCTION: This exam was performed  according to the departmental dose-optimization program which includes automated exposure control, adjustment of the mA and/or kV according to patient size and/or use of iterative reconstruction technique. CONTRAST:  OMNIPAQUE IOHEXOL 350 MG/ML SOLN COMPARISON:  Chest radiograph dated 05/22/2023. FINDINGS: Cardiovascular: Top-normal cardiac size. No pericardial effusion. There is 3 vessel coronary vascular calcification. Mild atherosclerotic calcification of the thoracic aorta. No aneurysmal dilatation. Evaluation of the pulmonary arteries is limited due to respiratory motion. No pulmonary artery embolus identified. Mediastinum/Nodes: No hilar or mediastinal adenopathy. The esophagus is grossly unremarkable. No mediastinal fluid collection. Lungs/Pleura: No focal consolidation, pleural effusion, or pneumothorax. The central airways are patent. Upper Abdomen: Fatty liver. Musculoskeletal: Median sternotomy wires. No acute osseous pathology. Review of the MIP images confirms the above findings. IMPRESSION: 1. No acute intrathoracic pathology. No CT evidence of pulmonary artery embolus. 2. Fatty liver. 3.  Aortic Atherosclerosis (ICD10-I70.0). Electronically Signed   By: Elgie Collard M.D.   On: 05/22/2023 22:15   DG Chest 2 View  Result Date: 05/22/2023 CLINICAL DATA:  Chest pain.  Shortness of breath. EXAM: CHEST - 2 VIEW COMPARISON:  09/12/2022. FINDINGS: Bilateral lung fields are clear. Bilateral costophrenic angles are clear. Stable cardio-mediastinal silhouette. Sternotomy wires noted. No acute osseous abnormalities. The soft tissues are within normal limits. IMPRESSION: *No active cardiopulmonary disease. Electronically Signed   By: Jules Schick M.D.   On: 05/22/2023 16:28    Results are pending, will review when available.  Assessment and Plan:  * Atypical chest pain Patient is presenting with 1 day of atypical left-sided chest pain that is occasionally reproducible.  This is also in the  setting of new onset dyspnea on exertion concerning for stable anginal equivalent.  High risk history include previous history of CAD requiring CABG and subsequent PCI with DES to RCA x 3, noncompliance with home DAPT or statin, frequent alcohol use and morbid obesity.  - Telemetry monitoring - Echocardiogram ordered - Cardiology consulted; appreciate their recommendations - Restart home Plavix - S/p aspirin 325 mg - Continue aspirin 81 mg daily - Restart high intensity  statin - Repeat A1c and lipid panel  Coronary artery disease involving native heart with angina pectoris (HCC) History of CAD s/p CABG in 2019 with subsequent PCI with DES to RCA x 3.  Please see management of atypical chest pain as noted above  - Cardiology consulted; appreciate their recommendations - Aspirin, Plavix and statin as noted above - Echocardiogram ordered  Alcohol use disorder Patient notes that over the past year, he has increased his daily alcohol intake due to grief regarding his mother's passing.  He notes that he does not drink daily, but does binge frequently.  I am concerned that his alcohol use is more severe given evidence of tremors on exam concerning for withdrawal.  In addition, labs obtained last month demonstrate thiamine deficiency and chronic LFT elevation.  - One-time dose of Ativan 0.5 mg IV - CIWA monitoring with Ativan - Start thiamine, folic acid and multivitamin  Elevated LFTs Chronically elevated LFTs in the setting of alcohol use.  Given mildly decreased platelets, will also check INR in the a.m.  - Hepatic functional panel pending - Pending results, may affect ability to continue statin use - INR - Repeat platelet count in the a.m.  Hypothyroidism Recently diagnosed hypothyroidism.  - Continue home Synthroid - TSH pending  Hyperlipidemia Lipid panel obtained earlier this month with markedly elevated LDL at 249.  Goal less than 70.  Discussed with the patient in extent  -  Restart Lipitor - Lipid panel pending  Essential hypertension Patient reports frequent missed doses of carvedilol.  - Restart at reduced dose  Advance Care Planning:   Code Status: Full Code   Consults: Cardiology  Family Communication: Patient's wife updated at bedside  Severity of Illness: The appropriate patient status for this patient is OBSERVATION. Observation status is judged to be reasonable and necessary in order to provide the required intensity of service to ensure the patient's safety. The patient's presenting symptoms, physical exam findings, and initial radiographic and laboratory data in the context of their medical condition is felt to place them at decreased risk for further clinical deterioration. Furthermore, it is anticipated that the patient will be medically stable for discharge from the hospital within 2 midnights of admission.   Author: Verdene Lennert, MD 05/22/2023 11:08 PM  For on call review [email or web address].

## 2023-05-22 NOTE — ED Provider Notes (Signed)
-----------------------------------------   3:58 PM on 05/22/2023 -----------------------------------------  Blood pressure (!) 180/118, pulse 81, temperature 97.6 F (36.4 C), temperature source Oral, resp. rate 17, height 5\' 8"  (1.727 m), weight 101.9 kg, SpO2 96%.  Assuming care from Dr. Derrill Kay.  In short, Edward Powell is a 52 y.o. male with a chief complaint of Chest Pain .  Refer to the original H&P for additional details.  The current plan of care is to follow-up repeat troponin and reassess to ensure no recurrent epistaxis.  ----------------------------------------- 10:09 PM on 05/22/2023 ----------------------------------------- Repeat troponin within normal limits, however patient continues to have sharp pain in his chest that is worse with movement along with some difficulty breathing.  CTA chest was performed and negative for PE by my read, no focal infiltrate noted.  He has had no recurrent epistaxis, however he is high risk for ACS and given ongoing chest pain with dyspnea on exertion, case discussed with hospitalist for admission.    Chesley Noon, MD 05/22/23 2210

## 2023-05-22 NOTE — Assessment & Plan Note (Addendum)
Patient is presenting with 1 day of atypical left-sided chest pain that is occasionally reproducible.  This is also in the setting of new onset dyspnea on exertion concerning for stable anginal equivalent.  High risk history include previous history of CAD requiring CABG and subsequent PCI with DES to RCA x 3, noncompliance with home DAPT or statin, frequent alcohol use and morbid obesity.  - Telemetry monitoring - Echocardiogram ordered - Cardiology consulted; appreciate their recommendations - Restart home Plavix - S/p aspirin 325 mg - Continue aspirin 81 mg daily - Restart high intensity statin - Repeat A1c and lipid panel

## 2023-05-22 NOTE — ED Triage Notes (Signed)
Pt here with cp, sob, and a nosebleed x4 days. Pt states his cp started 1 hr ago. Pt has hx of a triple bypass in 2019. Pt states cp is centered but does radiate. Pt states pain is sharp. Wife states pt has been blowing out blood clots.

## 2023-05-22 NOTE — ED Notes (Addendum)
Pt was instructed to keep clip in place. Patient removed clip less than 10 minutes after clip application. MD Derrill Kay notified

## 2023-05-22 NOTE — Assessment & Plan Note (Signed)
Patient reports frequent missed doses of carvedilol.  - Restart at reduced dose

## 2023-05-22 NOTE — Assessment & Plan Note (Addendum)
Patient notes that over the past year, he has increased his daily alcohol intake due to grief regarding his mother's passing.  He notes that he does not drink daily, but does binge frequently.  I am concerned that his alcohol use is more severe given evidence of tremors on exam concerning for withdrawal.  In addition, labs obtained last month demonstrate thiamine deficiency and chronic LFT elevation.  - One-time dose of Ativan 0.5 mg IV - CIWA monitoring with Ativan - Start thiamine, folic acid and multivitamin

## 2023-05-22 NOTE — ED Notes (Signed)
Pt noted to have tremors bilateral hands when reaching to hold an item pt and spouse report new onset while in ED ED physician notified

## 2023-05-22 NOTE — ED Notes (Signed)
Patient reports chest pain on exertion. Patient complaining of pain at this time.

## 2023-05-22 NOTE — ED Provider Notes (Signed)
[facility] Provider Note    Event Date/Time   First MD Initiated Contact with Patient 05/22/23 1421     (approximate)   History   Nose bleed   HPI  Edward Powell is a 52 y.o. male who presents to the emergency department today because of concerns for a nosebleed.  The patient states this has been going on for 3 to 4 days.  He does occasionally get nosebleeds.  He denies any trauma.  However this morning he also started developing some shortness of breath.  And while he was in the waiting room developed some chest pain.  States he has history of coronary artery disease.  Denies any recent fevers.     Physical Exam   Triage Vital Signs: ED Triage Vitals  Encounter Vitals Group     BP 05/22/23 1407 (!) 180/118     Systolic BP Percentile --      Diastolic BP Percentile --      Pulse Rate 05/22/23 1356 81     Resp 05/22/23 1356 17     Temp 05/22/23 1407 97.6 F (36.4 C)     Temp Source 05/22/23 1407 Oral     SpO2 05/22/23 1356 96 %     Weight 05/22/23 1356 224 lb 10.4 oz (101.9 kg)     Height 05/22/23 1356 5\' 8"  (1.727 m)     Head Circumference --      Peak Flow --      Pain Score 05/22/23 1356 9     Pain Loc --      Pain Education --      Exclude from Growth Chart --     Most recent vital signs: Vitals:   05/22/23 1356 05/22/23 1407  BP:  (!) 180/118  Pulse: 81   Resp: 17   Temp:  97.6 F (36.4 C)  SpO2: 96%    General: Awake, alert, oriented. CV:  Good peripheral perfusion. Regular rate and rhythm. Resp:  Normal effort. Lungs clear. Abd:  No distention.  Other:  Blood in bilateral nares.   ED Results / Procedures / Treatments   Labs (all labs ordered are listed, but only abnormal results are displayed) Labs Reviewed  BASIC METABOLIC PANEL - Abnormal; Notable for the following components:      Result Value   Glucose, Bld 149 (*)    Calcium 8.7 (*)    All other components within normal limits  CBC - Abnormal; Notable for  the following components:   RBC 3.98 (*)    Hemoglobin 12.7 (*)    HCT 37.4 (*)    RDW 15.6 (*)    Platelets 127 (*)    All other components within normal limits  TROPONIN I (HIGH SENSITIVITY)  TROPONIN I (HIGH SENSITIVITY)     EKG  I, Phineas Semen, attending physician, personally viewed and interpreted this EKG  EKG Time: 1400 Rate: 82 Rhythm: normal sinus Axis: right axis deviation Intervals: qtc 488 QRS: narrow ST changes: no st elevation Impression: normal ekg    RADIOLOGY I independently interpreted and visualized the CXR. My interpretation: No pneumonia Radiology interpretation: pending at time of sign out.    PROCEDURES:  Critical Care performed: No   MEDICATIONS ORDERED IN ED: Medications - No data to display   IMPRESSION / MDM / ASSESSMENT AND PLAN / ED COURSE  I reviewed the triage vital signs and the nursing notes.  Differential diagnosis includes, but is not limited to, ACS, pneumonia, PE, epistaxis  Patient's presentation is most consistent with acute presentation with potential threat to life or bodily function.   The patient is on the cardiac monitor to evaluate for evidence of arrhythmia and/or significant heart rate changes.  Patient presented to the emergency department today because of concerns for nosebleed as well as chest pain and shortness of breath.  In terms of the nosebleed there was some blood in bilateral naris but I did not appreciate any active bleeding.  Will have nurse give Afrin and clamp.  In terms of the shortness of breath and chest pain will check blood work and chest x-ray given history of coronary disease patient will need serial troponins if initial is negative.      FINAL CLINICAL IMPRESSION(S) / ED DIAGNOSES   Final diagnoses:  Epistaxis  Chest pain, unspecified type  Shortness of breath    Note:  This document was prepared using Dragon voice recognition software and may include  unintentional dictation errors.    Phineas Semen, MD 05/22/23 [phone]

## 2023-05-22 NOTE — ED Notes (Signed)
Patient transported to X-ray 

## 2023-05-22 NOTE — Assessment & Plan Note (Addendum)
Chronically elevated LFTs in the setting of alcohol use.  Given mildly decreased platelets, will also check INR in the a.m.  - Hepatic functional panel pending - Pending results, may affect ability to continue statin use - INR - Repeat platelet count in the a.m.

## 2023-05-22 NOTE — Assessment & Plan Note (Signed)
Recently diagnosed hypothyroidism.  - Continue home Synthroid - TSH pending

## 2023-05-22 NOTE — Assessment & Plan Note (Addendum)
Lipid panel obtained earlier this month with markedly elevated LDL at 249.  Goal less than 70.  Discussed with the patient in extent  - Restart Lipitor - Lipid panel pending

## 2023-05-23 ENCOUNTER — Encounter: Payer: Self-pay | Admitting: Internal Medicine

## 2023-05-23 ENCOUNTER — Observation Stay (HOSPITAL_BASED_OUTPATIENT_CLINIC_OR_DEPARTMENT_OTHER)
Admit: 2023-05-23 | Discharge: 2023-05-23 | Disposition: A | Discharge status: Home / Self Care | Payer: BC Managed Care – PPO | Attending: Internal Medicine | Admitting: Internal Medicine

## 2023-05-23 DIAGNOSIS — R079 Chest pain, unspecified: Secondary | ICD-10-CM | POA: Diagnosis not present

## 2023-05-23 DIAGNOSIS — R0789 Other chest pain: Secondary | ICD-10-CM | POA: Diagnosis not present

## 2023-05-23 LAB — ECHOCARDIOGRAM COMPLETE
AR max vel: 3.12 cm2
AV Area VTI: 3.24 cm2
AV Area mean vel: 2.86 cm2
AV Mean grad: 4 mm[Hg]
AV Peak grad: 6.9 mm[Hg]
Ao pk vel: 1.31 m/s
Area-P 1/2: 3.02 cm2
Height: 68 in
MV VTI: 3.58 cm2
S' Lateral: 3.2 cm
Weight: 3594.38 [oz_av]

## 2023-05-23 LAB — COMPREHENSIVE METABOLIC PANEL
ALT: 51 U/L — ABNORMAL HIGH (ref 0–44)
AST: 197 U/L — ABNORMAL HIGH (ref 15–41)
Albumin: 2.9 g/dL — ABNORMAL LOW (ref 3.5–5.0)
Alkaline Phosphatase: 241 U/L — ABNORMAL HIGH (ref 38–126)
Anion gap: 12 (ref 5–15)
BUN: 10 mg/dL (ref 6–20)
CO2: 25 mmol/L (ref 22–32)
Calcium: 8.4 mg/dL — ABNORMAL LOW (ref 8.9–10.3)
Chloride: 97 mmol/L — ABNORMAL LOW (ref 98–111)
Creatinine, Ser: 0.71 mg/dL (ref 0.61–1.24)
GFR, Estimated: >60 mL/min (ref >60)
Glucose, Bld: 120 mg/dL — ABNORMAL HIGH (ref 70–99)
Potassium: 3.4 mmol/L — ABNORMAL LOW (ref 3.5–5.1)
Sodium: 134 mmol/L — ABNORMAL LOW (ref 135–145)
Total Bilirubin: 3.1 mg/dL — ABNORMAL HIGH (ref <1.2)
Total Protein: 8.1 g/dL (ref 6.5–8.1)

## 2023-05-23 LAB — CBC WITH DIFFERENTIAL/PLATELET
Abs Immature Granulocytes: 0.03 10*3/uL (ref 0.00–0.07)
Basophils Absolute: 0.1 10*3/uL (ref 0.0–0.1)
Basophils Relative: 1 %
Eosinophils Absolute: 0.1 10*3/uL (ref 0.0–0.5)
Eosinophils Relative: 1 %
HCT: 37.2 % — ABNORMAL LOW (ref 39.0–52.0)
Hemoglobin: 12.5 g/dL — ABNORMAL LOW (ref 13.0–17.0)
Immature Granulocytes: 1 %
Lymphocytes Relative: 13 %
Lymphs Abs: 0.8 10*3/uL (ref 0.7–4.0)
MCH: 32 pg (ref 26.0–34.0)
MCHC: 33.6 g/dL (ref 30.0–36.0)
MCV: 95.1 fL (ref 80.0–100.0)
Monocytes Absolute: 0.5 10*3/uL (ref 0.1–1.0)
Monocytes Relative: 8 %
Neutro Abs: 5 10*3/uL (ref 1.7–7.7)
Neutrophils Relative %: 76 %
Platelets: 100 10*3/uL — ABNORMAL LOW (ref 150–400)
RBC: 3.91 MIL/uL — ABNORMAL LOW (ref 4.22–5.81)
RDW: 15.7 % — ABNORMAL HIGH (ref 11.5–15.5)
WBC: 6.5 10*3/uL (ref 4.0–10.5)
nRBC: 0 % (ref 0.0–0.2)

## 2023-05-23 LAB — LIPID PANEL
Cholesterol: 201 mg/dL — ABNORMAL HIGH (ref 0–200)
HDL: 20 mg/dL — ABNORMAL LOW (ref >40)
LDL Cholesterol: 156 mg/dL — ABNORMAL HIGH (ref 0–99)
Total CHOL/HDL Ratio: 10.1 {ratio}
Triglycerides: 127 mg/dL (ref <150)
VLDL: 25 mg/dL (ref 0–40)

## 2023-05-23 LAB — BRAIN NATRIURETIC PEPTIDE: B Natriuretic Peptide: 278.4 pg/mL — ABNORMAL HIGH (ref 0.0–100.0)

## 2023-05-23 LAB — HIV ANTIBODY (ROUTINE TESTING W REFLEX): HIV Screen 4th Generation wRfx: NONREACTIVE

## 2023-05-23 LAB — HEMOGLOBIN A1C
Hgb A1c MFr Bld: 6.7 % — ABNORMAL HIGH (ref 4.8–5.6)
Mean Plasma Glucose: 145.59 mg/dL

## 2023-05-23 MED ORDER — SALINE SPRAY 0.65 % NA SOLN: 1.0000 | NASAL | Status: DC | PRN

## 2023-05-23 MED ORDER — POTASSIUM CHLORIDE CRYS ER 20 MEQ PO TBCR
40.0000 meq | EXTENDED_RELEASE_TABLET | Freq: Once | ORAL | Status: AC
  Administered 2023-05-23: 40 meq via ORAL
  Filled 2023-05-23: qty 2

## 2023-05-23 MED ORDER — FUROSEMIDE 10 MG/ML IJ SOLN
40.0000 mg | Freq: Once | INTRAMUSCULAR | Status: AC
  Administered 2023-05-23: 40 mg via INTRAVENOUS
  Filled 2023-05-23: qty 4

## 2023-05-23 NOTE — Progress Notes (Signed)
[facility]  - [place] [facility]   PATIENT NAME: Edward Powell    MR#:  [id number]  DATE OF BIRTH:  07/28/1970  SUBJECTIVE:  wife at bedside. Seen earlier in the ER came in with chest pain and shortness of breath. Has intermittent nosebleed. Has been missing some office meds intermittently. On aspirin Plavix. Denies any chest pain at present. Vitals otherwise stable. Seen earlier by [facility] cardiology    VITALS:  Blood pressure 112/80, pulse 72, temperature 98.1 F (36.7 C), temperature source Oral, resp. rate 14, height 5\' 8"  (1.727 m), weight 101.9 kg, SpO2 94%.  PHYSICAL EXAMINATION:   GENERAL:  52 y.o.-year-old patient with no acute distress. Obese LUNGS: Normal breath sounds bilaterally, no wheezing CARDIOVASCULAR: S1, S2 normal. No murmur   ABDOMEN: Soft, nontender, nondistended. Bowel sounds present.  EXTREMITIES: No  edema b/l.    NEUROLOGIC: nonfocal  patient is alert and awake SKIN: No obvious rash, lesion, or ulcer.   LABORATORY PANEL:  CBC Recent Labs  Lab 05/23/23 0552  WBC 6.5  HGB 12.5*  HCT 37.2*  PLT 100*    Chemistries  Recent Labs  Lab 05/23/23 0552  NA 134*  K 3.4*  CL 97*  CO2 25  GLUCOSE 120*  BUN 10  CREATININE 0.71  CALCIUM 8.4*  AST 197*  ALT 51*  ALKPHOS 241*  BILITOT 3.1*   Cardiac Enzymes No results for input(s): "TROPONINI" in the last 168 hours. RADIOLOGY:  CT Angio Chest PE W/Cm &/Or Wo Cm  Result Date: 05/22/2023 CLINICAL DATA:  Concern for pulmonary embolism. EXAM: CT ANGIOGRAPHY CHEST WITH CONTRAST TECHNIQUE: Multidetector CT imaging of the chest was performed using the standard protocol during bolus administration of intravenous contrast. Multiplanar CT image reconstructions and MIPs were obtained to evaluate the vascular anatomy. RADIATION DOSE REDUCTION: This exam was performed according to the departmental dose-optimization program which includes automated exposure control, adjustment of the mA and/or  kV according to patient size and/or use of iterative reconstruction technique. CONTRAST:  OMNIPAQUE IOHEXOL 350 MG/ML SOLN COMPARISON:  Chest radiograph dated 05/22/2023. FINDINGS: Cardiovascular: Top-normal cardiac size. No pericardial effusion. There is 3 vessel coronary vascular calcification. Mild atherosclerotic calcification of the thoracic aorta. No aneurysmal dilatation. Evaluation of the pulmonary arteries is limited due to respiratory motion. No pulmonary artery embolus identified. Mediastinum/Nodes: No hilar or mediastinal adenopathy. The esophagus is grossly unremarkable. No mediastinal fluid collection. Lungs/Pleura: No focal consolidation, pleural effusion, or pneumothorax. The central airways are patent. Upper Abdomen: Fatty liver. Musculoskeletal: Median sternotomy wires. No acute osseous pathology. Review of the MIP images confirms the above findings. IMPRESSION: 1. No acute intrathoracic pathology. No CT evidence of pulmonary artery embolus. 2. Fatty liver. 3.  Aortic Atherosclerosis (ICD10-I70.0). Electronically Signed   By: Elgie Collard M.D.   On: 05/22/2023 22:15   DG Chest 2 View  Result Date: 05/22/2023 CLINICAL DATA:  Chest pain.  Shortness of breath. EXAM: CHEST - 2 VIEW COMPARISON:  09/12/2022. FINDINGS: Bilateral lung fields are clear. Bilateral costophrenic angles are clear. Stable cardio-mediastinal silhouette. Sternotomy wires noted. No acute osseous abnormalities. The soft tissues are within normal limits. IMPRESSION: *No active cardiopulmonary disease. Electronically Signed   By: Jules Schick M.D.   On: 05/22/2023 16:28    Assessment and Plan  Edward Powell is a 52 y.o. male with medical history significant of CAD s/p CABG in July 2019 followed by subsequent PCI, HFrEF with recovered EF 2/2 ischemic cardiomyopathy (last echo March 2020),  severe hyperlipidemia, hypertension, alcohol use disorder, who presents to the ED due to nosebleeds, shortness of breath on  exertion, and chest pain.  pt states that for the last 3-4 days, he has been experiencing an intermittent nosebleed that stopped intermittently but always restarts.  He has been experiencing some nasal discomfort due to this.  Then today, he noticed that he was having significant shortness of breath on exertion.    Pt states that he has been drinking more over the past 1 year since the death of his mother last Christmas. He states he does not drink every day, but has been binging more often.   CT angiogram chest No acute intrathoracic pathology. No CT evidence of pulmonary artery embolus. 2. Fatty liver. 3.  Aortic Atherosclerosis  Chest pain with shortness of breath coronary artery disease status post CABG in 2019 with subsequent PCI with DES to RCA times three -- presented with one day of atypical chest pain and dyspnea on exertion -- cardiac enzymes times two negative -- BNP 278 -- continue as aspirin, statins and Plavix -- [facility] cardiology consulted-- follow recommendation -- echo of the heart  Alcohol you disorder elevated LFTs in the setting of alcohol induced hepatitis --Patient notes that over the past year, he has increased his daily alcohol intake due to grief regarding his mother's passing.  He notes that he does not drink daily, but does binge frequently.  -- Watch for withdrawal -- advised cessation -- LFTs trending down  Hypothyroidism -- TSH - 23.5 -- continue Synthroid  Hyperlipidemia -- resumed Lipitor  Hypertension -- resume Coreg  Intermittent nosebleeds -- advised not to pick nose and use ocean nasal spray -- ENT consult as outpatient unless patient has significant nasal bleed  Procedures: Family communication :Wife at bedside Consults :[facility] cardiology CODE STATUS: full DVT Prophylaxis :lovenox Level of care: Telemetry Cardiac Status is: Observation The patient remains OBS appropriate and will d/c before 2 midnights.    TOTAL TIME TAKING CARE OF  THIS PATIENT: 35 minutes.  >50% time spent on counselling and coordination of care  Note: This dictation was prepared with Dragon dictation along with smaller phrase technology. Any transcriptional errors that result from this process are unintentional.  Enedina Finner M.D    [facility]   CC: Primary care physician; Margarita Mail, DO

## 2023-05-23 NOTE — TOC Initial Note (Addendum)
Transition of Care [facility]) - Initial/Assessment Note    Patient Details  Name: Edward Powell MRN: [id number] Date of Birth: 05/30/1971  [facility] CM/SW Contact:    Marquita Palms, LCSW Phone Number: 05/23/2023, 10:59 AM  Clinical Narrative:                  CSW spoke with patient and wife via phone. Patient reports he has recently established PCP. Pharmacy [place] - [facility]. Patient does not have a need for RW or O2 while home. Patients wife reported he has recently been on pain thinners and paces while he talks. Patient denies need for SA. Patient works from home with wife. Patient's wife reports he will be admitted. Patient discussed being on blood thinners. No other needs at this time.       Patient Goals and CMS Choice            Expected Discharge Plan and Services                                              Prior Living Arrangements/Services                       Activities of Daily Living      Permission Sought/Granted                  Emotional Assessment              Admission diagnosis:  Atypical chest pain [R07.89] Patient Active Problem List   Diagnosis Date Noted   Atypical chest pain 05/22/2023   Alcohol use disorder 05/22/2023   Hypothyroidism 05/22/2023   Elevated LFTs 05/22/2023   Cellulitis of left hand 05/03/2020   Status post primary angioplasty with coronary stent 06/15/2018   Effort angina (HCC) 06/14/2018   Ischemic cardiomyopathy 06/14/2018   Hyperlipidemia 06/14/2018   Obesity (BMI 30.0-34.9) 05/10/2018   Moderate concentric left ventricular hypertrophy 05/10/2018   Essential hypertension 03/08/2018   Coronary artery disease involving native heart with angina pectoris (HCC)    S/P CABG x 4 01/10/2018   Unstable angina (HCC) 01/09/2018   NSTEMI (non-ST elevated myocardial infarction) [facility])    Asthmatic bronchitis 01/08/2018   PCP:  Margarita Mail, DO Pharmacy:   [facility] [address] [place] - [address] [place] [address] Phone: [phone] Fax: [phone]     Social Determinants of Health (SDOH) Social History: SDOH Screenings   Food Insecurity: No Food Insecurity (04/25/2023)  Housing: Low Risk  (04/25/2023)  Transportation Needs: No Transportation Needs (04/25/2023)  Alcohol Screen: Low Risk  (04/25/2023)  Depression (PHQ2-9): Low Risk  (04/25/2023)  Financial Resource Strain: Low Risk  (04/25/2023)  Physical Activity: Unknown (04/25/2023)  Social Connections: Moderately Isolated (04/25/2023)  Stress: No Stress Concern Present (04/25/2023)  Tobacco Use: Medium Risk (04/25/2023)   SDOH Interventions:     Readmission Risk Interventions     No data to display

## 2023-05-23 NOTE — ED Notes (Signed)
Patient calling out stating his nose is bleeding. This RN went in patient's room and helped patient place kleenex in his nose. Bleeding controlled at this time.

## 2023-05-23 NOTE — Progress Notes (Signed)
*  PRELIMINARY RESULTS* Echocardiogram 2D Echocardiogram has been performed.  Carolyne Fiscal 05/23/2023, 12:44 PM

## 2023-05-23 NOTE — Consult Note (Addendum)
Cardiology Consult    Patient ID: Bhupinder Plagens MRN: [id number], DOB/AGE: 54/28/1972   Admit date: 05/22/2023 Date of Consult: 05/23/2023  Primary Physician: Margarita Mail, DO Primary Cardiologist: Lorine Bears, MD Requesting Provider: Kathie Rhodes. Allena Katz, MD  Patient Profile    Edward Powell is a 52 y.o. male with a history of CAD s/p CABG, HTN, HL, HTG, etoh abuse, and prior tobacco abuse, who is being seen today for the evaluation of epistaxis and dyspnea at the request of Dr. Allena Katz.  Past Medical History  Subjective  Past Medical History:  Diagnosis Date   Alcohol abuse    CAD (coronary artery disease)    a. 12/2017 NSTEMI/Cath: Severe 3VD; b. 12/2017 CABG x 4: LIMA->LAD, VG->RI->LCX, VG->RPDA; c. 05/2018 PCI: LM 50d, LAD 50ost, 80p/m, RI nl, LCX 100p, OM2 fills via collats from dLAD, RCA 70p (2.75 x 18 Resolute Onyx DES), 59m, 69m, 60d, VG->OM2 100, LIMA->LAD ok, VG->RPDA 100ost.   Essential hypertension    Hyperlipidemia LDL goal <70    Hypertriglyceridemia    Ischemic cardiomyopathy    a. 05/2018 LV Gram: EF 35-45%; b. 08/2018 Echo: EF 55-60%, nl RV fxn, RVSP 41.29mmHg. Grossly nl MV/TV/AV.   Pulmonary hypertension (HCC)    a. 12/2017 Echo: EF 55-60%; b. 08/2018 Echo: EF 55-60%, RVSP 41.6 mmHg.   Tobacco abuse     Past Surgical History:  Procedure Laterality Date   CORONARY ARTERY BYPASS GRAFT N/A 01/10/2018   Procedure: CORONARY ARTERY BYPASS GRAFTING (CABG);  Surgeon: Delight Ovens, MD;  Location: [facility] OR;  Service: Open Heart Surgery;  Laterality: N/A;  Times 4 using left internal mammary artery to the LAD and endoscopically harvested left saphenous vein to OM1, OM2, and PLB   CORONARY STENT INTERVENTION N/A 06/14/2018   Procedure: CORONARY STENT INTERVENTION;  Surgeon: Iran Ouch, MD;  Location: [facility] INVASIVE CV LAB;  Service: Cardiovascular;  Laterality: N/A;   LEFT HEART CATH AND CORONARY ANGIOGRAPHY N/A 01/09/2018   Procedure: LEFT HEART CATH AND CORONARY  ANGIOGRAPHY;  Surgeon: Yvonne Kendall, MD;  Location: [facility] INVASIVE CV LAB;  Service: Cardiovascular;  Laterality: N/A;   LEFT HEART CATH AND CORONARY ANGIOGRAPHY N/A 05/31/2018   Procedure: LEFT HEART CATH AND CORONARY ANGIOGRAPHY;  Surgeon: Iran Ouch, MD;  Location: [facility] INVASIVE CV LAB;  Service: Cardiovascular;  Laterality: N/A;   NO PAST SURGERIES     TEE WITHOUT CARDIOVERSION N/A 01/10/2018   Procedure: TRANSESOPHAGEAL ECHOCARDIOGRAM (TEE);  Surgeon: Delight Ovens, MD;  Location: [facility] OR;  Service: Open Heart Surgery;  Laterality: N/A;     Allergies  No Known Allergies    History of Present Illness     52 y.o. male with a history of CAD s/p CABG, HTN, HL, HTG, etoh abuse, and prior tobacco abuse.  In July 2019, he suffered a non-STEMI and was found to have multivessel CAD.  He subsequently underwent CABG x 4.  He quit smoking post CABG.  Echo in March 2020 showed an EF of 55 6% without valvular abnormalities.  In March 2024, he was seen in the emergency department with hypotension and chronic chest pain.  His creatinine was 1.7 and he admitted to poor p.o. intake.  He received IV fluids and was discharged home from the ED. he does have a history of alcohol use, typically drinking 8-9 shots of vodka twice a week.   He was in his usoh until about 1 wk ago, when he started experiencing intermittent, sometimes large volume, epistaxis  w/ large clots.  In that setting, he has been having nasal congestion and difficulty breathing.  He has not had any trauma to his nose/face.  He does take asa/plavix @ home.  Over the past few days, he has also been having intermittent sharp and fleeting chest pain that is worse w/ upper body mvmts and deep breathing.  This was not initially associated with dyspnea, but on 12/2, he did note DOE w/ minimal activity.  Due to epistaxis and dyspnea, he presented to the emergency department on December 2.  Here, he was afebrile and hypertensive at 180/118.  ECG  shows sinus rhythm at 82 bpm with baseline artifact and no acute ST or T changes.  Lab work notable for TSH of 23.270, mild normocytic anemia.  LFTs were abnormal with an AST of 209 and ALT of 54.  Alkaline phosphatase 236.  Albumin low 2.8.  Troponins normal.  BNP 278.4.  Chest x-ray without active disease.  CTA of the chest was negative for PE.  Fatty liver was noted.  We have been asked to evaluate in the setting of fleeting chest pain and dyspnea.    Inpatient Medications  Subjective    atorvastatin  40 mg Oral Daily   carvedilol  12.5 mg Oral BID WC   clopidogrel  75 mg Oral Daily   enoxaparin (LOVENOX) injection  0.5 mg/kg Subcutaneous Q24H   folic acid  1 mg Oral Daily   levothyroxine  25 mcg Oral Q0600   multivitamin with minerals  1 tablet Oral Daily   thiamine  100 mg Oral Daily   Or   thiamine  100 mg Intravenous Daily    Family History    Family History  Problem Relation Age of Onset   Hypertension Mother    Breast cancer Mother    Hyperlipidemia Mother    Liver cancer Father    Lung cancer Father    Hypertension Father    Atrial fibrillation Father    Stroke Brother    Hypertension Brother    Hyperlipidemia Brother    Heart attack Maternal Grandmother    Stroke Maternal Grandmother    Hyperlipidemia Maternal Grandmother    Hypertension Maternal Grandmother    Stroke Paternal Grandmother    Alzheimer's disease Paternal Grandmother    Hypertension Paternal Grandmother    He indicated that his mother is alive. He indicated that his father is alive. He indicated that his brother is alive. He indicated that his maternal grandmother is deceased. He indicated that his maternal grandfather is deceased. He indicated that his paternal grandmother is deceased. He indicated that his paternal grandfather is deceased.   Social History    Social History   Socioeconomic History   Marital status: Married    Spouse name: Not on file   Number of children: Not on file    Years of education: 12   Highest education level: Bachelor's degree (e.g., BA, AB, BS)  Occupational History   Not on file  Tobacco Use   Smoking status: Former    Current packs/day: 1.00    Average packs/day: 1 pack/day for 30.0 years (30.0 ttl pk-yrs)    Types: Cigarettes   Smokeless tobacco: Never   Tobacco comments:    last cigarette was day of the heart attack  Vaping Use   Vaping status: Never Used  Substance and Sexual Activity   Alcohol use: Yes    Alcohol/week: 18.0 standard drinks of alcohol    Types: 18 Shots of  liquor per week    Comment: up to 8-9 shots twice weekly - vodka   Drug use: Not Currently   Sexual activity: Yes  Other Topics Concern   Not on file  Social History Narrative   Lives in [place] with significant other.  No children.  Does not routinely exercise.   Social Determinants of Health   Financial Resource Strain: Low Risk  (04/25/2023)   Overall Financial Resource Strain (CARDIA)    Difficulty of Paying Living Expenses: Not very hard  Food Insecurity: No Food Insecurity (04/25/2023)   Hunger Vital Sign    Worried About Running Out of Food in the Last Year: Never true    Ran Out of Food in the Last Year: Never true  Transportation Needs: No Transportation Needs (04/25/2023)   PRAPARE - Administrator, Civil Service (Medical): No    Lack of Transportation (Non-Medical): No  Physical Activity: Unknown (04/25/2023)   Exercise Vital Sign    Days of Exercise per Week: 0 days    Minutes of Exercise per Session: Not on file  Stress: No Stress Concern Present (04/25/2023)   [employer] of Occupational Health - Occupational Stress Questionnaire    Feeling of Stress : Only a little  Social Connections: Moderately Isolated (04/25/2023)   Social Connection and Isolation Panel [NHANES]    Frequency of Communication with Friends and Family: Twice a week    Frequency of Social Gatherings with Friends and Family: Once a week    Attends Religious  Services: Never    Database administrator or Organizations: No    Attends Engineer, structural: Not on file    Marital Status: Married  Intimate Partner Violence: Unknown (01/10/2018)   Humiliation, Afraid, Rape, and Kick questionnaire    Fear of Current or Ex-Partner: Patient declined    Emotionally Abused: Patient declined    Physically Abused: Patient declined    Sexually Abused: Patient declined     Review of Systems    General:  No chills, fever, night sweats or weight changes.  Cardiovascular:  +++ sharp chest pain, +++ dyspnea on exertion, occas lower ext edema, no orthopnea, palpitations, paroxysmal nocturnal dyspnea. Dermatological: No rash, lesions/masses Respiratory: +++ epistaxis.  No cough, +++ dyspnea Urologic: +++ blood in semen in past - resolved w/o intervention or eval.  No hematuria, dysuria Abdominal:   No nausea, vomiting, diarrhea, bright red blood per rectum, melena, or hematemesis Neurologic:  Numbness in toes since June.  No visual changes, wkns, changes in mental status. All other systems reviewed and are otherwise negative except as noted above.    Objective  Physical Exam    Blood pressure 121/84, pulse 69, temperature 98.1 F (36.7 C), temperature source Oral, resp. rate 13, height 5\' 8"  (1.727 m), weight 101.9 kg, SpO2 92%.  General: Pleasant, NAD Psych: Normal affect. Neuro: Alert and oriented X 3. Moves all extremities spontaneously. HEENT: Normal  Neck: Supple without bruits or JVD. Lungs:  Resp regular and unlabored, CTA. Heart: RRR no s3, s4, or murmurs. Abdomen: Soft, non-tender, non-distended, BS + x 4.  Extremities: No clubbing, cyanosis or edema. DP/PT2+, Radials 2+ and equal bilaterally.  Labs    Cardiac Enzymes Recent Labs  Lab 05/22/23 1408 05/22/23 1728  TROPONINIHS 17 15     BNP    Component Value Date/Time   BNP 278.4 (H) 05/23/2023 0552    Lab Results  Component Value Date   WBC 6.5 05/23/2023   HGB  12.5 (L)  05/23/2023   HCT 37.2 (L) 05/23/2023   MCV 95.1 05/23/2023   PLT 100 (L) 05/23/2023    Recent Labs  Lab 05/23/23 0552  NA 134*  K 3.4*  CL 97*  CO2 25  BUN 10  CREATININE 0.71  CALCIUM 8.4*  PROT 8.1  BILITOT 3.1*  ALKPHOS 241*  ALT 51*  AST 197*  GLUCOSE 120*   Lab Results  Component Value Date   CHOL 201 (H) 05/23/2023   HDL 20 (L) 05/23/2023   LDLCALC 156 (H) 05/23/2023   TRIG 127 05/23/2023    Radiology Studies    CT Angio Chest PE W/Cm &/Or Wo Cm  Result Date: 05/22/2023 CLINICAL DATA:  Concern for pulmonary embolism. EXAM: CT ANGIOGRAPHY CHEST WITH CONTRAST TECHNIQUE: Multidetector CT imaging of the chest was performed using the standard protocol during bolus administration of intravenous contrast. Multiplanar CT image reconstructions and MIPs were obtained to evaluate the vascular anatomy. RADIATION DOSE REDUCTION: This exam was performed according to the departmental dose-optimization program which includes automated exposure control, adjustment of the mA and/or kV according to patient size and/or use of iterative reconstruction technique. CONTRAST:  OMNIPAQUE IOHEXOL 350 MG/ML SOLN COMPARISON:  Chest radiograph dated 05/22/2023. FINDINGS: Cardiovascular: Top-normal cardiac size. No pericardial effusion. There is 3 vessel coronary vascular calcification. Mild atherosclerotic calcification of the thoracic aorta. No aneurysmal dilatation. Evaluation of the pulmonary arteries is limited due to respiratory motion. No pulmonary artery embolus identified. Mediastinum/Nodes: No hilar or mediastinal adenopathy. The esophagus is grossly unremarkable. No mediastinal fluid collection. Lungs/Pleura: No focal consolidation, pleural effusion, or pneumothorax. The central airways are patent. Upper Abdomen: Fatty liver. Musculoskeletal: Median sternotomy wires. No acute osseous pathology. Review of the MIP images confirms the above findings. IMPRESSION: 1. No acute intrathoracic  pathology. No CT evidence of pulmonary artery embolus. 2. Fatty liver. 3.  Aortic Atherosclerosis (ICD10-I70.0). Electronically Signed   By: Elgie Collard M.D.   On: 05/22/2023 22:15   DG Chest 2 View  Result Date: 05/22/2023 CLINICAL DATA:  Chest pain.  Shortness of breath. EXAM: CHEST - 2 VIEW COMPARISON:  09/12/2022. FINDINGS: Bilateral lung fields are clear. Bilateral costophrenic angles are clear. Stable cardio-mediastinal silhouette. Sternotomy wires noted. No acute osseous abnormalities. The soft tissues are within normal limits. IMPRESSION: *No active cardiopulmonary disease. Electronically Signed   By: Jules Schick M.D.   On: 05/22/2023 16:28      ECG & Cardiac Imaging    Regular sinus rhythm, 82, baseline artifact- personally reviewed.  Assessment & Plan    1.  Chest pain/CAD: Prior history of CABG x 4 in 2019.  Patient has been having intermittent, sharp, and fleeting chest pain over the past several weeks, that seems to worsen with upper body movements and deep breathing.  He has also been experiencing epistaxis and progressive dyspnea in the setting of airway congestion.  ECG without acute changes.  Troponins are normal.  A follow-up echocardiogram with stable LV function without regional wall motion abnormalities, will consider outpatient stress testing versus conservative, ongoing medical therapy given somewhat atypical nature of symptoms.  In the setting of epistaxis, will DC aspirin.  Continue Plavix and beta-blocker.  Hold statin in the setting of LFT abnormalities.  2.  Primary hypertension: Blood pressure markedly elevated on arrival, currently improved.  Reports compliance with all medications.  Continue beta-blocker.  Follow and add ARB if pressures rise.  3.  Hyperlipidemia/hypertriglyceridemia: LDL 156 with triglycerides of 127.  Outpatient medications include  atorvastatin though based on labs, it appears he has not been taking.  4.  Epistaxis: Patient with several week  history of persistent, fairly heavy nosebleeding.  We have discontinued aspirin but will continue clopidogrel for the time being.  Will need to see ENT.  5.  Dyspnea: Very dyspneic over the past 2 days.  BNP was mildly elevated to 78.4 in the setting of elevated blood pressure on arrival.  Chest x-ray without edema and does not appear particularly volume overloaded on examination.  With BNP elevation, will provide a dose of intravenous Lasix, and he may benefit from an oral diuretic at home.  Unclear role of nasal congestion in the setting of nosebleeding, on his dyspnea.  6.  Alcohol abuse: Drinks 8-9 shots of vodka weekly.  Cessation advised.  CIWA protocol per medicine team.  7.  Transaminitis:  In setting of #6.  Hold statin.  ETOH cessation.  8.  Hypokalemia: Potassium 3.4.  Supplementation ordered.  9.  Hypothyroidism:  TSH up from last month @ 23.27.  Levothyroxine dosing per primary team.  Risk Assessment/Risk Scores:     TIMI Risk Score for Unstable Angina or Non-ST Elevation MI:   The patient's TIMI risk score is 3, which indicates a 13% risk of all cause mortality, new or recurrent myocardial infarction or need for urgent revascularization in the next 14 days.  New York Heart Association (NYHA) Functional Class NYHA Class III    Signed, Nicolasa Ducking, NP 05/23/2023, 5:04 PM  For questions or updates, please contact   Please consult [email or web address] for contact info under Cardiology/STEMI.

## 2023-05-24 ENCOUNTER — Encounter: Payer: Self-pay | Admitting: Internal Medicine

## 2023-05-24 DIAGNOSIS — R079 Chest pain, unspecified: Secondary | ICD-10-CM | POA: Diagnosis not present

## 2023-05-24 DIAGNOSIS — R040 Epistaxis: Principal | ICD-10-CM | POA: Insufficient documentation

## 2023-05-24 DIAGNOSIS — R0789 Other chest pain: Secondary | ICD-10-CM | POA: Diagnosis not present

## 2023-05-24 DIAGNOSIS — R0609 Other forms of dyspnea: Secondary | ICD-10-CM

## 2023-05-24 DIAGNOSIS — I25118 Atherosclerotic heart disease of native coronary artery with other forms of angina pectoris: Secondary | ICD-10-CM

## 2023-05-24 DIAGNOSIS — Z951 Presence of aortocoronary bypass graft: Secondary | ICD-10-CM

## 2023-05-24 DIAGNOSIS — R0602 Shortness of breath: Secondary | ICD-10-CM | POA: Diagnosis not present

## 2023-05-24 DIAGNOSIS — R7309 Other abnormal glucose: Secondary | ICD-10-CM | POA: Insufficient documentation

## 2023-05-24 DIAGNOSIS — K7010 Alcoholic hepatitis without ascites: Secondary | ICD-10-CM | POA: Insufficient documentation

## 2023-05-24 LAB — BASIC METABOLIC PANEL
Anion gap: 11 (ref 5–15)
BUN: 15 mg/dL (ref 6–20)
CO2: 23 mmol/L (ref 22–32)
Calcium: 8.4 mg/dL — ABNORMAL LOW (ref 8.9–10.3)
Chloride: 98 mmol/L (ref 98–111)
Creatinine, Ser: 1.09 mg/dL (ref 0.61–1.24)
GFR, Estimated: >60 mL/min (ref >60)
Glucose, Bld: 146 mg/dL — ABNORMAL HIGH (ref 70–99)
Potassium: 3.8 mmol/L (ref 3.5–5.1)
Sodium: 132 mmol/L — ABNORMAL LOW (ref 135–145)

## 2023-05-24 LAB — CBC
HCT: 35.9 % — ABNORMAL LOW (ref 39.0–52.0)
Hemoglobin: 12.2 g/dL — ABNORMAL LOW (ref 13.0–17.0)
MCH: 32 pg (ref 26.0–34.0)
MCHC: 34 g/dL (ref 30.0–36.0)
MCV: 94.2 fL (ref 80.0–100.0)
Platelets: 91 10*3/uL — ABNORMAL LOW (ref 150–400)
RBC: 3.81 MIL/uL — ABNORMAL LOW (ref 4.22–5.81)
RDW: 15.9 % — ABNORMAL HIGH (ref 11.5–15.5)
WBC: 5.9 10*3/uL (ref 4.0–10.5)
nRBC: 0 % (ref 0.0–0.2)

## 2023-05-24 LAB — HEPATIC FUNCTION PANEL
ALT: 53 U/L — ABNORMAL HIGH (ref 0–44)
AST: 218 U/L — ABNORMAL HIGH (ref 15–41)
Albumin: 2.7 g/dL — ABNORMAL LOW (ref 3.5–5.0)
Alkaline Phosphatase: 210 U/L — ABNORMAL HIGH (ref 38–126)
Bilirubin, Direct: 1.2 mg/dL — ABNORMAL HIGH (ref 0.0–0.2)
Indirect Bilirubin: 1.3 mg/dL — ABNORMAL HIGH (ref 0.3–0.9)
Total Bilirubin: 2.5 mg/dL — ABNORMAL HIGH (ref <1.2)
Total Protein: 7.5 g/dL (ref 6.5–8.1)

## 2023-05-24 MED ORDER — MUPIROCIN 2 % EX OINT: 1.0000 | TOPICAL_OINTMENT | Freq: Every day | CUTANEOUS | 0 refills | Status: DC

## 2023-05-24 MED ORDER — OXYMETAZOLINE HCL 0.05 % NA SOLN: 2.0000 | Freq: Two times a day (BID) | NASAL | 2 refills | Status: DC

## 2023-05-24 NOTE — Discharge Summary (Signed)
Edward Powell UJW:[id number] DOB: 01/21/1971 DOA: 05/22/2023  PCP: Margarita Mail, DO  Admit date: 05/22/2023 Discharge date: 05/24/2023  Time spent: 35 minutes  Recommendations for Outpatient Follow-up:  Pcp f/u, repeat cbc and LFTs at f/u Alcohol abstinence ENT f/u Cardiology f/u     Discharge Diagnoses:  Principal Problem:   Atypical chest pain Active Problems:   Coronary artery disease involving native heart with angina pectoris (HCC)   Alcohol use disorder   Elevated LFTs   Essential hypertension   Hyperlipidemia   Hypothyroidism   Epistaxis   Alcoholic hepatitis   Elevated hemoglobin A1c   Discharge Condition: stable  Diet recommendation: heart healthy  Filed Weights   05/22/23 1356  Weight: 101.9 kg    History of present illness:  From admission h and p Edward Powell is a 52 y.o. male with medical history significant of CAD s/p CABG in July 2019 followed by subsequent PCI, HFrEF with recovered EF 2/2 ischemic cardiomyopathy (last echo March 2020), severe hyperlipidemia, hypertension, alcohol use disorder, who presents to the ED due to nosebleeds, shortness of breath on exertion, and chest pain.   Edward Powell states that for the last 3-4 days, he has been experiencing an intermittent nosebleed that stopped intermittently but always restarts.  He has been experiencing some nasal discomfort due to this.  Then today, he noticed that he was having significant shortness of breath on exertion.  He was unable to climb his usual steps to his apartment without having to take a break.  He decided to come to the ER, and since arriving here he began to experience substernal left-sided chest pain without radiation.  It seems that lifting his arm and palpating makes it worse occasionally.  He denies any palpitations, lower extremity swelling or orthopnea.   Edward Powell states that he has been drinking more over the past 1 year since the death of his mother last Christmas.   He states he does not drink every day, but has been binging more often.  His wife at bedside notes that he has also become much more sedentary and does not leave the apartment very often.  Hospital Course:  Patient presents complaining of recurrent epistaxis for several days, also shortness of breath. Cardiology consulted for the SOB. CTA neg for PE or other acute pathology, nothing acute seen on TTE, no signs ACS. Dyspnea resolved, no further cardiac w/u advised by cardiology. For his epistaxis his home aspirin, plavix, and excessive alcohol use all likely contribute. Cardiology OK with holding aspirin so will do that. Patient is aware no nsaids. Bleeding currently controlled, it responds to pressure. Discussed with ENT Dr. Willeen Cass, will d/c home with oxymetazoline and mupirocin ointment with plans for outpatient ENT f/u, they may cauterize at that time. Here labs show alcohol hepatitis, relatively stable, with steatosis seen on imaging. No signs/symptoms of withdrawal. Also elevated A1c which is new. Also elevated tsh but patient says he is aware and was recently started on levothyroxine by pcp. Advised alcohol cessation and close pcp f/u regarding that, also advise repeating CBC and LFTs at f/u to ensure those are at least trending steady. Also advised holding statin until we see improvement in LFTs and alcohol abuse is under control.   Procedures: none   Consultations: cardiology  Discharge Exam: Vitals:   05/24/23 0821 05/24/23 1159  BP: 134/86 124/83  Pulse: 72 72  Resp: 16 18  Temp: 98.6 F (37 C) 98.2 F (36.8 C)  SpO2: 97% 95%  General: NAD Cardiovascular: RRR Respiratory: CTAB Ext: warm, no edema  Discharge Instructions   Discharge Instructions     Diet - low sodium heart healthy   Complete by: As directed    Increase activity slowly   Complete by: As directed       Allergies as of 05/24/2023   No Known Allergies      Medication List     STOP taking these  medications    aspirin EC 81 MG tablet   atorvastatin 80 MG tablet Commonly known as: LIPITOR       TAKE these medications    carvedilol 25 MG tablet Commonly known as: COREG Take 1 tablet (25 mg total) by mouth 2 (two) times daily with a meal.   clopidogrel 75 MG tablet Commonly known as: PLAVIX Take 1 tablet (75 mg total) by mouth daily.   levothyroxine 25 MCG tablet Commonly known as: SYNTHROID Take 1 tablet (25 mcg total) by mouth daily.   mupirocin ointment 2 % Commonly known as: BACTROBAN Apply 1 Application topically daily.   oxymetazoline 0.05 % nasal spray Commonly known as: AFRIN Place 2 sprays into both nostrils 2 (two) times daily.       No Known Allergies  Follow-up Information     Margarita Mail, DO Follow up.   Specialty: Internal Medicine Contact information: [address] [place] [address] [phone]         Geanie Logan, MD Follow up.   Specialty: Otolaryngology Contact information: [address] [place] [address] [place] [address] [phone]                  The results of significant diagnostics from this hospitalization (including imaging, microbiology, ancillary and laboratory) are listed below for reference.    Significant Diagnostic Studies: ECHOCARDIOGRAM COMPLETE  Result Date: 05/23/2023    ECHOCARDIOGRAM REPORT   Patient Name:   [facility] Date of Exam: 05/23/2023 Medical Rec #:  [id number]         Height:       68.0 in Accession #:    [id number]        Weight:       224.6 lb Date of Birth:  02/23/1971        BSA:          2.147 m Patient Age:    51 years          BP:           154/98 mmHg Patient Gender: M                 HR:           72 bpm. Exam Location:  [facility] Procedure: 2D Echo, Cardiac Doppler, Color Doppler and Strain Analysis Indications:     Chest Pain  History:         Patient has prior history of Echocardiogram examinations, most                  recent  09/03/2018. Cardiomyopathy, CAD, Angina and Previous                  Myocardial Infarction, Prior CABG, Signs/Symptoms:Chest Pain;                  Risk Factors:Dyslipidemia and Former Smoker.  Sonographer:     Mikki Harbor Referring Phys:  [id number] Verdene Lennert Diagnosing Phys: Yvonne Kendall MD  Sonographer Comments: Technically difficult study due to  poor echo windows, suboptimal parasternal window and no subcostal window. Global longitudinal strain was attempted. IMPRESSIONS  1. Left ventricular ejection fraction, by estimation, is 55 to 60%. The left ventricle has normal function. Left ventricular endocardial border not optimally defined to evaluate regional wall motion. There is mild left ventricular hypertrophy. Left ventricular diastolic parameters are consistent with Grade I diastolic dysfunction (impaired relaxation). The average left ventricular global longitudinal strain is -15.8 %. The global longitudinal strain is abnormal.  2. Right ventricular systolic function is low normal. The right ventricular size is normal. Tricuspid regurgitation signal is inadequate for assessing PA pressure.  3. Left atrial size was mildly dilated.  4. The mitral valve is grossly normal. No evidence of mitral valve regurgitation.  5. The aortic valve was not well visualized. Aortic valve regurgitation is not visualized. No aortic stenosis is present. FINDINGS  Left Ventricle: Left ventricular ejection fraction, by estimation, is 55 to 60%. The left ventricle has normal function. Left ventricular endocardial border not optimally defined to evaluate regional wall motion. The average left ventricular global longitudinal strain is -15.8 %. The global longitudinal strain is abnormal. The left ventricular internal cavity size was normal in size. There is mild left ventricular hypertrophy. Left ventricular diastolic parameters are consistent with Grade I diastolic dysfunction (impaired relaxation). Right Ventricle: The right  ventricular size is normal. No increase in right ventricular wall thickness. Right ventricular systolic function is low normal. Tricuspid regurgitation signal is inadequate for assessing PA pressure. Left Atrium: Left atrial size was mildly dilated. Right Atrium: Right atrial size was normal in size. Pericardium: The pericardium was not well visualized. Mitral Valve: The mitral valve is grossly normal. No evidence of mitral valve regurgitation. MV peak gradient, 2.9 mmHg. The mean mitral valve gradient is 1.0 mmHg. Tricuspid Valve: The tricuspid valve is not well visualized. Tricuspid valve regurgitation is not demonstrated. Aortic Valve: The aortic valve was not well visualized. Aortic valve regurgitation is not visualized. No aortic stenosis is present. Aortic valve mean gradient measures 4.0 mmHg. Aortic valve peak gradient measures 6.9 mmHg. Aortic valve area, by VTI measures 3.24 cm. Pulmonic Valve: The pulmonic valve was not well visualized. Pulmonic valve regurgitation is not visualized. No evidence of pulmonic stenosis. Aorta: The aortic root is normal in size and structure. Venous: The inferior vena cava was not well visualized. IAS/Shunts: The interatrial septum was not well visualized.  LEFT VENTRICLE PLAX 2D LVIDd:         5.20 cm   Diastology LVIDs:         3.20 cm   LV e' medial:    7.83 cm/s LV PW:         1.10 cm   LV E/e' medial:  7.3 LV IVS:        1.30 cm   LV e' lateral:   13.70 cm/s LVOT diam:     2.10 cm   LV E/e' lateral: 4.2 LV SV:         85 LV SV Index:   40        2D Longitudinal Strain LVOT Area:     3.46 cm  2D Strain GLS Avg:     -15.8 %  RIGHT VENTRICLE RV Basal diam:  3.90 cm RV Mid diam:    3.80 cm RV S prime:     12.00 cm/s LEFT ATRIUM             Index        RIGHT ATRIUM  Index LA diam:        4.70 cm 2.19 cm/m   RA Area:     18.70 cm LA Vol (A2C):   55.1 ml 25.66 ml/m  RA Volume:   47.50 ml  22.12 ml/m LA Vol (A4C):   86.8 ml 40.42 ml/m LA Biplane Vol: 71.0 ml  33.07 ml/m  AORTIC VALVE                    PULMONIC VALVE AV Area (Vmax):    3.12 cm     PV Vmax:       1.12 m/s AV Area (Vmean):   2.86 cm     PV Peak grad:  5.0 mmHg AV Area (VTI):     3.24 cm AV Vmax:           131.00 cm/s AV Vmean:          86.000 cm/s AV VTI:            0.262 m AV Peak Grad:      6.9 mmHg AV Mean Grad:      4.0 mmHg LVOT Vmax:         118.00 cm/s LVOT Vmean:        70.900 cm/s LVOT VTI:          0.245 m LVOT/AV VTI ratio: 0.94  AORTA Ao Root diam: 3.70 cm MITRAL VALVE MV Area (PHT): 3.02 cm    SHUNTS MV Area VTI:   3.58 cm    Systemic VTI:  0.24 m MV Peak grad:  2.9 mmHg    Systemic Diam: 2.10 cm MV Mean grad:  1.0 mmHg MV Vmax:       0.85 m/s MV Vmean:      47.5 cm/s MV Decel Time: 251 msec MV E velocity: 57.50 cm/s MV A velocity: 77.70 cm/s MV E/A ratio:  0.74 Cristal Deer End MD Electronically signed by Yvonne Kendall MD Signature Date/Time: 05/23/2023/5:09:54 PM    Final    CT Angio Chest PE W/Cm &/Or Wo Cm  Result Date: 05/22/2023 CLINICAL DATA:  Concern for pulmonary embolism. EXAM: CT ANGIOGRAPHY CHEST WITH CONTRAST TECHNIQUE: Multidetector CT imaging of the chest was performed using the standard protocol during bolus administration of intravenous contrast. Multiplanar CT image reconstructions and MIPs were obtained to evaluate the vascular anatomy. RADIATION DOSE REDUCTION: This exam was performed according to the departmental dose-optimization program which includes automated exposure control, adjustment of the mA and/or kV according to patient size and/or use of iterative reconstruction technique. CONTRAST:  OMNIPAQUE IOHEXOL 350 MG/ML SOLN COMPARISON:  Chest radiograph dated 05/22/2023. FINDINGS: Cardiovascular: Top-normal cardiac size. No pericardial effusion. There is 3 vessel coronary vascular calcification. Mild atherosclerotic calcification of the thoracic aorta. No aneurysmal dilatation. Evaluation of the pulmonary arteries is limited due to respiratory motion.  No pulmonary artery embolus identified. Mediastinum/Nodes: No hilar or mediastinal adenopathy. The esophagus is grossly unremarkable. No mediastinal fluid collection. Lungs/Pleura: No focal consolidation, pleural effusion, or pneumothorax. The central airways are patent. Upper Abdomen: Fatty liver. Musculoskeletal: Median sternotomy wires. No acute osseous pathology. Review of the MIP images confirms the above findings. IMPRESSION: 1. No acute intrathoracic pathology. No CT evidence of pulmonary artery embolus. 2. Fatty liver. 3.  Aortic Atherosclerosis (ICD10-I70.0). Electronically Signed   By: Elgie Collard M.D.   On: 05/22/2023 22:15   DG Chest 2 View  Result Date: 05/22/2023 CLINICAL DATA:  Chest pain.  Shortness of breath. EXAM: CHEST - 2 VIEW  COMPARISON:  09/12/2022. FINDINGS: Bilateral lung fields are clear. Bilateral costophrenic angles are clear. Stable cardio-mediastinal silhouette. Sternotomy wires noted. No acute osseous abnormalities. The soft tissues are within normal limits. IMPRESSION: *No active cardiopulmonary disease. Electronically Signed   By: Jules Schick M.D.   On: 05/22/2023 16:28    Microbiology: No results found for this or any previous visit (from the past 240 hour(s)).   Labs: Basic Metabolic Panel: Recent Labs  Lab 05/22/23 1408 05/23/23 0552 05/24/23 0550  NA 135 134* 132*  K 3.5 3.4* 3.8  CL 100 97* 98  CO2 23 25 23   GLUCOSE 149* 120* 146*  BUN 9 10 15   CREATININE 0.62 0.71 1.09  CALCIUM 8.7* 8.4* 8.4*   Liver Function Tests: Recent Labs  Lab 05/22/23 1727 05/23/23 0552 05/24/23 1121  AST 209* 197* 218*  ALT 54* 51* 53*  ALKPHOS 236* 241* 210*  BILITOT 2.0* 3.1* 2.5*  PROT 7.8 8.1 7.5  ALBUMIN 2.8* 2.9* 2.7*   No results for input(s): "LIPASE", "AMYLASE" in the last 168 hours. No results for input(s): "AMMONIA" in the last 168 hours. CBC: Recent Labs  Lab 05/22/23 1408 05/23/23 0552 05/24/23 1121  WBC 8.2 6.5 5.9  NEUTROABS  --  5.0   --   HGB 12.7* 12.5* 12.2*  HCT 37.4* 37.2* 35.9*  MCV 94.0 95.1 94.2  PLT 127* 100* 91*   Cardiac Enzymes: No results for input(s): "CKTOTAL", "CKMB", "CKMBINDEX", "TROPONINI" in the last 168 hours. BNP: BNP (last 3 results) Recent Labs    05/23/23 0552  BNP 278.4*    ProBNP (last 3 results) No results for input(s): "PROBNP" in the last 8760 hours.  CBG: No results for input(s): "GLUCAP" in the last 168 hours.     Signed:  Silvano Bilis MD.  [facility] 05/24/2023, 12:37 PM 3

## 2023-05-24 NOTE — Progress Notes (Signed)
Rounding Note    Patient Name: Edward Powell Date of Encounter: 05/24/2023  [facility] Cardiologist: Lorine Bears, MD   Subjective   Patient reports continued shortness of breath with exertion and atypical chest pain. His nose bled on and off through the night. He believes the blood clotting in his nose is significantly contributing to his shortness of breath. He describes the chest pain as muscular, stating the pain was reproduced with the echo probe during yesterday's exam. Denies palpitations, lightheadedness, and dizziness. Blood pressures stable.   Inpatient Medications    Scheduled Meds:  carvedilol  12.5 mg Oral BID WC   clopidogrel  75 mg Oral Daily   enoxaparin (LOVENOX) injection  0.5 mg/kg Subcutaneous Q24H   folic acid  1 mg Oral Daily   levothyroxine  25 mcg Oral Q0600   multivitamin with minerals  1 tablet Oral Daily   thiamine  100 mg Oral Daily   Or   thiamine  100 mg Intravenous Daily   Continuous Infusions:  PRN Meds: acetaminophen, LORazepam **OR** LORazepam, nitroGLYCERIN, ondansetron (ZOFRAN) IV, sodium chloride   Vital Signs    Vitals:   05/24/23 0018 05/24/23 0100 05/24/23 0500 05/24/23 0821  BP: (!) 136/94 129/63 (!) 141/91 134/86  Pulse: 78 72 79 72  Resp: 20   16  Temp: 98.8 F (37.1 C)  99.4 F (37.4 C) 98.6 F (37 C)  TempSrc: Oral  Oral   SpO2: 94%  92% 97%  Weight:      Height:       No intake or output data in the 24 hours ending 05/24/23 1027    05/22/2023    1:56 PM 04/25/2023    1:17 PM 09/19/2022    2:47 PM  Last 3 Weights  Weight (lbs) 224 lb 10.4 oz 224 lb 9.6 oz 224 lb 12.8 oz  Weight (kg) 101.9 kg 101.878 kg 101.969 kg      Telemetry    NSR rate 70s bpm - Personally Reviewed  ECG    No new - Personally Reviewed  Physical Exam   GEN: No acute distress.   Neck: No JVD Cardiac: RRR, no murmurs, rubs, or gallops.  Respiratory: Clear to auscultation bilaterally. GI: Soft, nontender, non-distended   MS: No edema; No deformity. Neuro:  Nonfocal  Psych: Normal affect   Labs    High Sensitivity Troponin:   Recent Labs  Lab 05/22/23 1408 05/22/23 1728  TROPONINIHS 17 15     Chemistry Recent Labs  Lab 05/22/23 1408 05/22/23 1727 05/23/23 0552 05/24/23 0550  NA 135  --  134* 132*  K 3.5  --  3.4* 3.8  CL 100  --  97* 98  CO2 23  --  25 23  GLUCOSE 149*  --  120* 146*  BUN 9  --  10 15  CREATININE 0.62  --  0.71 1.09  CALCIUM 8.7*  --  8.4* 8.4*  PROT  --  7.8 8.1  --   ALBUMIN  --  2.8* 2.9*  --   AST  --  209* 197*  --   ALT  --  54* 51*  --   ALKPHOS  --  236* 241*  --   BILITOT  --  2.0* 3.1*  --   GFRNONAA >60  --  >60 >60  ANIONGAP 12  --  12 11    Lipids  Recent Labs  Lab 05/23/23 0552  CHOL 201*  TRIG 127  HDL  20*  LDLCALC 156*  CHOLHDL 10.1    Hematology Recent Labs  Lab 05/22/23 1408 05/23/23 0552  WBC 8.2 6.5  RBC 3.98* 3.91*  HGB 12.7* 12.5*  HCT 37.4* 37.2*  MCV 94.0 95.1  MCH 31.9 32.0  MCHC 34.0 33.6  RDW 15.6* 15.7*  PLT 127* 100*   Thyroid  Recent Labs  Lab 05/22/23 1727  TSH 23.270*    BNP Recent Labs  Lab 05/23/23 0552  BNP 278.4*    DDimer No results for input(s): "DDIMER" in the last 168 hours.   Radiology    ECHOCARDIOGRAM COMPLETE  Result Date: 05/23/2023    ECHOCARDIOGRAM REPORT   Patient Name:   Edward Powell Date of Exam: 05/23/2023 Medical Rec #:  [id number]         Height:       68.0 in Accession #:    [id number]        Weight:       224.6 lb Date of Birth:  10/08/1970        BSA:          2.147 m Patient Age:    51 years          BP:           154/98 mmHg Patient Gender: M                 HR:           72 bpm. Exam Location:  [facility] Procedure: 2D Echo, Cardiac Doppler, Color Doppler and Strain Analysis Indications:     Chest Pain  History:         Patient has prior history of Echocardiogram examinations, most                  recent 09/03/2018. Cardiomyopathy, CAD, Angina and Previous                   Myocardial Infarction, Prior CABG, Signs/Symptoms:Chest Pain;                  Risk Factors:Dyslipidemia and Former Smoker.  Sonographer:     Mikki Harbor Referring Phys:  [id number] Verdene Lennert Diagnosing Phys: Yvonne Kendall MD  Sonographer Comments: Technically difficult study due to poor echo windows, suboptimal parasternal window and no subcostal window. Global longitudinal strain was attempted. IMPRESSIONS  1. Left ventricular ejection fraction, by estimation, is 55 to 60%. The left ventricle has normal function. Left ventricular endocardial border not optimally defined to evaluate regional wall motion. There is mild left ventricular hypertrophy. Left ventricular diastolic parameters are consistent with Grade I diastolic dysfunction (impaired relaxation). The average left ventricular global longitudinal strain is -15.8 %. The global longitudinal strain is abnormal.  2. Right ventricular systolic function is low normal. The right ventricular size is normal. Tricuspid regurgitation signal is inadequate for assessing PA pressure.  3. Left atrial size was mildly dilated.  4. The mitral valve is grossly normal. No evidence of mitral valve regurgitation.  5. The aortic valve was not well visualized. Aortic valve regurgitation is not visualized. No aortic stenosis is present. FINDINGS  Left Ventricle: Left ventricular ejection fraction, by estimation, is 55 to 60%. The left ventricle has normal function. Left ventricular endocardial border not optimally defined to evaluate regional wall motion. The average left ventricular global longitudinal strain is -15.8 %. The global longitudinal strain is abnormal. The left ventricular internal cavity size was normal in size. There is mild left ventricular  hypertrophy. Left ventricular diastolic parameters are consistent with Grade I diastolic dysfunction (impaired relaxation). Right Ventricle: The right ventricular size is normal. No increase in right ventricular wall  thickness. Right ventricular systolic function is low normal. Tricuspid regurgitation signal is inadequate for assessing PA pressure. Left Atrium: Left atrial size was mildly dilated. Right Atrium: Right atrial size was normal in size. Pericardium: The pericardium was not well visualized. Mitral Valve: The mitral valve is grossly normal. No evidence of mitral valve regurgitation. MV peak gradient, 2.9 mmHg. The mean mitral valve gradient is 1.0 mmHg. Tricuspid Valve: The tricuspid valve is not well visualized. Tricuspid valve regurgitation is not demonstrated. Aortic Valve: The aortic valve was not well visualized. Aortic valve regurgitation is not visualized. No aortic stenosis is present. Aortic valve mean gradient measures 4.0 mmHg. Aortic valve peak gradient measures 6.9 mmHg. Aortic valve area, by VTI measures 3.24 cm. Pulmonic Valve: The pulmonic valve was not well visualized. Pulmonic valve regurgitation is not visualized. No evidence of pulmonic stenosis. Aorta: The aortic root is normal in size and structure. Venous: The inferior vena cava was not well visualized. IAS/Shunts: The interatrial septum was not well visualized.  LEFT VENTRICLE PLAX 2D LVIDd:         5.20 cm   Diastology LVIDs:         3.20 cm   LV e' medial:    7.83 cm/s LV PW:         1.10 cm   LV E/e' medial:  7.3 LV IVS:        1.30 cm   LV e' lateral:   13.70 cm/s LVOT diam:     2.10 cm   LV E/e' lateral: 4.2 LV SV:         85 LV SV Index:   40        2D Longitudinal Strain LVOT Area:     3.46 cm  2D Strain GLS Avg:     -15.8 %  RIGHT VENTRICLE RV Basal diam:  3.90 cm RV Mid diam:    3.80 cm RV S prime:     12.00 cm/s LEFT ATRIUM             Index        RIGHT ATRIUM           Index LA diam:        4.70 cm 2.19 cm/m   RA Area:     18.70 cm LA Vol (A2C):   55.1 ml 25.66 ml/m  RA Volume:   47.50 ml  22.12 ml/m LA Vol (A4C):   86.8 ml 40.42 ml/m LA Biplane Vol: 71.0 ml 33.07 ml/m  AORTIC VALVE                    PULMONIC VALVE AV Area  (Vmax):    3.12 cm     PV Vmax:       1.12 m/s AV Area (Vmean):   2.86 cm     PV Peak grad:  5.0 mmHg AV Area (VTI):     3.24 cm AV Vmax:           131.00 cm/s AV Vmean:          86.000 cm/s AV VTI:            0.262 m AV Peak Grad:      6.9 mmHg AV Mean Grad:      4.0 mmHg LVOT Vmax:  118.00 cm/s LVOT Vmean:        70.900 cm/s LVOT VTI:          0.245 m LVOT/AV VTI ratio: 0.94  AORTA Ao Root diam: 3.70 cm MITRAL VALVE MV Area (PHT): 3.02 cm    SHUNTS MV Area VTI:   3.58 cm    Systemic VTI:  0.24 m MV Peak grad:  2.9 mmHg    Systemic Diam: 2.10 cm MV Mean grad:  1.0 mmHg MV Vmax:       0.85 m/s MV Vmean:      47.5 cm/s MV Decel Time: 251 msec MV E velocity: 57.50 cm/s MV A velocity: 77.70 cm/s MV E/A ratio:  0.74 Cristal Deer End MD Electronically signed by Yvonne Kendall MD Signature Date/Time: 05/23/2023/5:09:54 PM    Final    CT Angio Chest PE W/Cm &/Or Wo Cm  Result Date: 05/22/2023 CLINICAL DATA:  Concern for pulmonary embolism. EXAM: CT ANGIOGRAPHY CHEST WITH CONTRAST TECHNIQUE: Multidetector CT imaging of the chest was performed using the standard protocol during bolus administration of intravenous contrast. Multiplanar CT image reconstructions and MIPs were obtained to evaluate the vascular anatomy. RADIATION DOSE REDUCTION: This exam was performed according to the departmental dose-optimization program which includes automated exposure control, adjustment of the mA and/or kV according to patient size and/or use of iterative reconstruction technique. CONTRAST:  OMNIPAQUE IOHEXOL 350 MG/ML SOLN COMPARISON:  Chest radiograph dated 05/22/2023. FINDINGS: Cardiovascular: Top-normal cardiac size. No pericardial effusion. There is 3 vessel coronary vascular calcification. Mild atherosclerotic calcification of the thoracic aorta. No aneurysmal dilatation. Evaluation of the pulmonary arteries is limited due to respiratory motion. No pulmonary artery embolus identified. Mediastinum/Nodes: No hilar  or mediastinal adenopathy. The esophagus is grossly unremarkable. No mediastinal fluid collection. Lungs/Pleura: No focal consolidation, pleural effusion, or pneumothorax. The central airways are patent. Upper Abdomen: Fatty liver. Musculoskeletal: Median sternotomy wires. No acute osseous pathology. Review of the MIP images confirms the above findings. IMPRESSION: 1. No acute intrathoracic pathology. No CT evidence of pulmonary artery embolus. 2. Fatty liver. 3.  Aortic Atherosclerosis (ICD10-I70.0). Electronically Signed   By: Elgie Collard M.D.   On: 05/22/2023 22:15   DG Chest 2 View  Result Date: 05/22/2023 CLINICAL DATA:  Chest pain.  Shortness of breath. EXAM: CHEST - 2 VIEW COMPARISON:  09/12/2022. FINDINGS: Bilateral lung fields are clear. Bilateral costophrenic angles are clear. Stable cardio-mediastinal silhouette. Sternotomy wires noted. No acute osseous abnormalities. The soft tissues are within normal limits. IMPRESSION: *No active cardiopulmonary disease. Electronically Signed   By: Jules Schick M.D.   On: 05/22/2023 16:28    Cardiac Studies   05/23/23 Echocardiogram IMPRESSIONS 1. Left ventricular ejection fraction, by estimation, is 55 to 60% . The left ventricle has normal function. Left ventricular endocardial border not optimally defined to evaluate regional wall motion. There is mild left ventricular hypertrophy. Left ventricular diastolic parameters are consistent with Grade I diastolic dysfunction ( impaired relaxation) . The average left ventricular global longitudinal strain is - 15. 8 % . The global longitudinal strain is abnormal. 2. Right ventricular systolic function is low normal. The right ventricular size is normal. Tricuspid regurgitation signal is inadequate for assessing PA pressure. 3. Left atrial size was mildly dilated. 4. The mitral valve is grossly normal. No evidence of mitral valve regurgitation. 5. The aortic valve was not well visualized. Aortic valve  regurgitation is not visualized. No aortic stenosis is present.  Patient Profile     Edward Powell is  a 52 y.o. male with a history of CAD s/p CABG, HTN, HL, HTG, etoh abuse, and prior tobacco abuse, who is being seen today for continued evaluation of epistaxis and dyspnea.  Assessment & Plan    Chest pain/CAD - Patient describes his chest pain as intermittent, sharp, and fleeting occurring over the past several weeks. This continues today with reproducible pain on palpation.  - Chest pain seems secondary to dyspnea in the setting of epistaxis - EKG without ischemic changes - Troponins normal - Echocardiogram showed LVEF 55-60% without wall motion abnormalities - Consider outpatient stress testing vs continued medical therapy - Aspirin has been d/c in the setting of epistaxis - Continue Plavix and carvedilol   Primary HTN - BP stable  - Continue carvedilol  HL/Hypertriglyceridemia - Most recent lipid panel 05/23/23 TC 201, LDL 156, TG 127 - Suspect non compliance with statin - Continue atorvastatin, encouraged compliance  Epistaxis - Several week history of persistent, heavy nose bleeding  - Epistaxis continued overnight - Aspirin has been d/c, continue Plavix - This seems to be driving symptoms, management per IM  Dyspnea - Increased shortness of breath with exertion for past 2 days - BNP mildly elevated at 78.4 in the setting of hypertension - Patient given one IV dose of lasix yesterday  - Consider outpatient diuretic - Dyspnea seems to be driven by epistaxis/nasal congestion  Alcohol abuse - Drinks 8-9 shots of vodka weekly - Encouraged cessation - CIWA protocol per IM  Transaminitis - In the setting of #6 - Hold statin - Encouraged ETOH cessation  Hypokalemia - K 3.4>>3.8 - Will continue to monitor  Hypothyroidism - TSH 23.27 up from previous value - Management per IM  For questions or updates, please contact [facility] Please consult  [email or web address] for contact info under        Signed, Orion Crook, PA-C  05/24/2023, 10:27 AM

## 2023-05-24 NOTE — Plan of Care (Signed)
Progressing towards goals

## 2023-06-02 ENCOUNTER — Ambulatory Visit: Payer: BC Managed Care – PPO | Admitting: Student

## 2023-06-19 ENCOUNTER — Other Ambulatory Visit: Payer: Self-pay | Admitting: Internal Medicine

## 2023-06-19 DIAGNOSIS — R7989 Other specified abnormal findings of blood chemistry: Secondary | ICD-10-CM

## 2023-06-23 NOTE — Telephone Encounter (Signed)
 Requested Prescriptions  Pending Prescriptions Disp Refills   levothyroxine  (SYNTHROID ) 25 MCG tablet [Pharmacy Med Name: Levothyroxine  Sodium 25 MCG Oral Tablet] 60 tablet 0    Sig: Take 1 tablet by mouth once daily     Endocrinology:  Hypothyroid Agents Failed - 06/23/2023  9:12 AM      Failed - TSH in normal range and within 360 days    TSH  Date Value Ref Range Status  05/22/2023 23.270 (H) 0.350 - 4.500 uIU/mL Final    Comment:    Performed by a 3rd Generation assay with a functional sensitivity of <=0.01 uIU/mL. Performed at [facility], [address] [place], [place] [address]   04/25/2023 15.17 (H) 0.40 - 4.50 mIU/L Final         Passed - Valid encounter within last 12 months    Recent Outpatient Visits           1 month ago Essential hypertension   [facility] Bernardo Fend, DO   5 years ago Coronary artery disease involving native coronary artery of native heart with angina pectoris [employer])   [facility], Newell SQUIBB, MD

## 2023-08-21 ENCOUNTER — Emergency Department

## 2023-08-21 ENCOUNTER — Observation Stay
Admission: EM | Admit: 2023-08-21 | Discharge: 2023-08-23 | Disposition: A | Discharge status: Home / Self Care | Attending: Obstetrics and Gynecology | Admitting: Obstetrics and Gynecology

## 2023-08-21 ENCOUNTER — Other Ambulatory Visit: Payer: Self-pay

## 2023-08-21 DIAGNOSIS — I255 Ischemic cardiomyopathy: Secondary | ICD-10-CM | POA: Diagnosis not present

## 2023-08-21 DIAGNOSIS — R040 Epistaxis: Secondary | ICD-10-CM | POA: Diagnosis not present

## 2023-08-21 DIAGNOSIS — R0602 Shortness of breath: Secondary | ICD-10-CM | POA: Diagnosis not present

## 2023-08-21 DIAGNOSIS — Z79899 Other long term (current) drug therapy: Secondary | ICD-10-CM | POA: Diagnosis not present

## 2023-08-21 DIAGNOSIS — K920 Hematemesis: Principal | ICD-10-CM

## 2023-08-21 DIAGNOSIS — I5020 Unspecified systolic (congestive) heart failure: Secondary | ICD-10-CM | POA: Insufficient documentation

## 2023-08-21 DIAGNOSIS — R7989 Other specified abnormal findings of blood chemistry: Secondary | ICD-10-CM | POA: Diagnosis not present

## 2023-08-21 DIAGNOSIS — T18108A Unspecified foreign body in esophagus causing other injury, initial encounter: Secondary | ICD-10-CM | POA: Diagnosis not present

## 2023-08-21 DIAGNOSIS — Z8673 Personal history of transient ischemic attack (TIA), and cerebral infarction without residual deficits: Secondary | ICD-10-CM | POA: Diagnosis not present

## 2023-08-21 DIAGNOSIS — G459 Transient cerebral ischemic attack, unspecified: Secondary | ICD-10-CM | POA: Diagnosis present

## 2023-08-21 DIAGNOSIS — Z951 Presence of aortocoronary bypass graft: Secondary | ICD-10-CM | POA: Insufficient documentation

## 2023-08-21 DIAGNOSIS — K3189 Other diseases of stomach and duodenum: Secondary | ICD-10-CM | POA: Insufficient documentation

## 2023-08-21 DIAGNOSIS — I517 Cardiomegaly: Secondary | ICD-10-CM | POA: Diagnosis not present

## 2023-08-21 DIAGNOSIS — R079 Chest pain, unspecified: Principal | ICD-10-CM

## 2023-08-21 DIAGNOSIS — R945 Abnormal results of liver function studies: Secondary | ICD-10-CM | POA: Diagnosis not present

## 2023-08-21 DIAGNOSIS — I8500 Esophageal varices without bleeding: Secondary | ICD-10-CM | POA: Diagnosis not present

## 2023-08-21 DIAGNOSIS — R0600 Dyspnea, unspecified: Secondary | ICD-10-CM | POA: Insufficient documentation

## 2023-08-21 DIAGNOSIS — F10239 Alcohol dependence with withdrawal, unspecified: Secondary | ICD-10-CM | POA: Diagnosis not present

## 2023-08-21 DIAGNOSIS — R0609 Other forms of dyspnea: Secondary | ICD-10-CM | POA: Diagnosis present

## 2023-08-21 DIAGNOSIS — R112 Nausea with vomiting, unspecified: Secondary | ICD-10-CM | POA: Diagnosis not present

## 2023-08-21 DIAGNOSIS — I5023 Acute on chronic systolic (congestive) heart failure: Secondary | ICD-10-CM | POA: Diagnosis not present

## 2023-08-21 DIAGNOSIS — K8020 Calculus of gallbladder without cholecystitis without obstruction: Secondary | ICD-10-CM | POA: Diagnosis not present

## 2023-08-21 DIAGNOSIS — R0789 Other chest pain: Secondary | ICD-10-CM | POA: Diagnosis not present

## 2023-08-21 DIAGNOSIS — I110 Hypertensive heart disease with heart failure: Secondary | ICD-10-CM | POA: Diagnosis not present

## 2023-08-21 DIAGNOSIS — Z1152 Encounter for screening for COVID-19: Secondary | ICD-10-CM | POA: Insufficient documentation

## 2023-08-21 DIAGNOSIS — I10 Essential (primary) hypertension: Secondary | ICD-10-CM | POA: Diagnosis present

## 2023-08-21 DIAGNOSIS — I2510 Atherosclerotic heart disease of native coronary artery without angina pectoris: Secondary | ICD-10-CM

## 2023-08-21 DIAGNOSIS — K828 Other specified diseases of gallbladder: Secondary | ICD-10-CM | POA: Diagnosis not present

## 2023-08-21 DIAGNOSIS — F1010 Alcohol abuse, uncomplicated: Secondary | ICD-10-CM

## 2023-08-21 DIAGNOSIS — Z87891 Personal history of nicotine dependence: Secondary | ICD-10-CM | POA: Diagnosis not present

## 2023-08-21 DIAGNOSIS — F10939 Alcohol use, unspecified with withdrawal, unspecified: Secondary | ICD-10-CM | POA: Diagnosis present

## 2023-08-21 DIAGNOSIS — K760 Fatty (change of) liver, not elsewhere classified: Secondary | ICD-10-CM | POA: Diagnosis not present

## 2023-08-21 DIAGNOSIS — K7010 Alcoholic hepatitis without ascites: Secondary | ICD-10-CM

## 2023-08-21 DIAGNOSIS — F10130 Alcohol abuse with withdrawal, uncomplicated: Secondary | ICD-10-CM | POA: Diagnosis not present

## 2023-08-21 DIAGNOSIS — F1090 Alcohol use, unspecified, uncomplicated: Secondary | ICD-10-CM | POA: Diagnosis present

## 2023-08-21 LAB — HEPATIC FUNCTION PANEL
ALT: 123 U/L — ABNORMAL HIGH (ref 0–44)
AST: 370 U/L — ABNORMAL HIGH (ref 15–41)
Albumin: 3.5 g/dL (ref 3.5–5.0)
Alkaline Phosphatase: 199 U/L — ABNORMAL HIGH (ref 38–126)
Bilirubin, Direct: 0.5 mg/dL — ABNORMAL HIGH (ref 0.0–0.2)
Indirect Bilirubin: 1.3 mg/dL — ABNORMAL HIGH (ref 0.3–0.9)
Total Bilirubin: 1.8 mg/dL — ABNORMAL HIGH (ref 0.0–1.2)
Total Protein: 7.9 g/dL (ref 6.5–8.1)

## 2023-08-21 LAB — BASIC METABOLIC PANEL
Anion gap: 14 (ref 5–15)
BUN: 11 mg/dL (ref 6–20)
CO2: 25 mmol/L (ref 22–32)
Calcium: 9.1 mg/dL (ref 8.9–10.3)
Chloride: 100 mmol/L (ref 98–111)
Creatinine, Ser: 0.68 mg/dL (ref 0.61–1.24)
GFR, Estimated: >60 mL/min (ref >60)
Glucose, Bld: 132 mg/dL — ABNORMAL HIGH (ref 70–99)
Potassium: 3.3 mmol/L — ABNORMAL LOW (ref 3.5–5.1)
Sodium: 139 mmol/L (ref 135–145)

## 2023-08-21 LAB — CBC
HCT: 40.6 % (ref 39.0–52.0)
Hemoglobin: 14.4 g/dL (ref 13.0–17.0)
MCH: 31.7 pg (ref 26.0–34.0)
MCHC: 35.5 g/dL (ref 30.0–36.0)
MCV: 89.4 fL (ref 80.0–100.0)
Platelets: 72 10*3/uL — ABNORMAL LOW (ref 150–400)
RBC: 4.54 MIL/uL (ref 4.22–5.81)
RDW: 14.9 % (ref 11.5–15.5)
WBC: 7.1 10*3/uL (ref 4.0–10.5)
nRBC: 0 % (ref 0.0–0.2)

## 2023-08-21 LAB — BRAIN NATRIURETIC PEPTIDE: B Natriuretic Peptide: 62.5 pg/mL (ref 0.0–100.0)

## 2023-08-21 LAB — RESP PANEL BY RT-PCR (RSV, FLU A&B, COVID)  RVPGX2
Influenza A by PCR: NEGATIVE
Influenza B by PCR: NEGATIVE
Resp Syncytial Virus by PCR: NEGATIVE
SARS Coronavirus 2 by RT PCR: NEGATIVE

## 2023-08-21 LAB — TROPONIN I (HIGH SENSITIVITY)
Troponin I (High Sensitivity): 15 ng/L (ref <18)
Troponin I (High Sensitivity): 15 ng/L (ref <18)

## 2023-08-21 LAB — ETHANOL: Alcohol, Ethyl (B): 73 mg/dL — ABNORMAL HIGH (ref <10)

## 2023-08-21 LAB — AMMONIA: Ammonia: 38 umol/L — ABNORMAL HIGH (ref 9–35)

## 2023-08-21 LAB — LIPASE, BLOOD: Lipase: 51 U/L (ref 11–51)

## 2023-08-21 LAB — D-DIMER, QUANTITATIVE: D-Dimer, Quant: 0.82 ug{FEU}/mL — ABNORMAL HIGH (ref 0.00–0.50)

## 2023-08-21 MED ORDER — SODIUM CHLORIDE 0.9 % IV SOLN: 12.5000 mg | Freq: Four times a day (QID) | INTRAVENOUS | Status: DC | PRN

## 2023-08-21 MED ORDER — ONDANSETRON HCL 4 MG/2ML IJ SOLN
4.0000 mg | Freq: Four times a day (QID) | INTRAMUSCULAR | Status: DC | PRN
  Administered 2023-08-22 (×2): 4 mg via INTRAVENOUS
  Filled 2023-08-21: qty 2

## 2023-08-21 MED ORDER — ONDANSETRON HCL 4 MG PO TABS: 4.0000 mg | ORAL_TABLET | Freq: Four times a day (QID) | ORAL | Status: DC | PRN

## 2023-08-21 MED ORDER — ALUM & MAG HYDROXIDE-SIMETH 200-200-20 MG/5ML PO SUSP
30.0000 mL | Freq: Once | ORAL | Status: AC
  Administered 2023-08-21: 30 mL via ORAL
  Filled 2023-08-21: qty 30

## 2023-08-21 MED ORDER — THIAMINE HCL 100 MG/ML IJ SOLN
100.0000 mg | Freq: Once | INTRAMUSCULAR | Status: AC
  Administered 2023-08-21: 100 mg via INTRAVENOUS
  Filled 2023-08-21: qty 2

## 2023-08-21 MED ORDER — PHENOBARBITAL SODIUM 65 MG/ML IJ SOLN: 65.0000 mg | Freq: Three times a day (TID) | INTRAMUSCULAR | Status: DC

## 2023-08-21 MED ORDER — IOHEXOL 350 MG/ML SOLN
75.0000 mL | Freq: Once | INTRAVENOUS | Status: AC | PRN
  Administered 2023-08-21: 75 mL via INTRAVENOUS

## 2023-08-21 MED ORDER — ONDANSETRON HCL 4 MG/2ML IJ SOLN
4.0000 mg | Freq: Once | INTRAMUSCULAR | Status: AC
  Administered 2023-08-21: 4 mg via INTRAVENOUS
  Filled 2023-08-21: qty 2

## 2023-08-21 MED ORDER — THIAMINE MONONITRATE 100 MG PO TABS
100.0000 mg | ORAL_TABLET | ORAL | Status: DC
  Administered 2023-08-22: 100 mg via ORAL
  Filled 2023-08-21: qty 1

## 2023-08-21 MED ORDER — POTASSIUM CHLORIDE IN NACL 40-0.9 MEQ/L-% IV SOLN
INTRAVENOUS | Status: AC
Start: 2023-08-21 — End: 2023-08-22
  Filled 2023-08-21: qty 1000

## 2023-08-21 MED ORDER — FOLIC ACID 1 MG PO TABS
1.0000 mg | ORAL_TABLET | Freq: Every day | ORAL | Status: DC
  Administered 2023-08-21 – 2023-08-23 (×3): 1 mg via ORAL
  Filled 2023-08-21 (×3): qty 1

## 2023-08-21 MED ORDER — OXYCODONE HCL 5 MG PO TABS: 5.0000 mg | ORAL_TABLET | ORAL | Status: DC | PRN

## 2023-08-21 MED ORDER — PANTOPRAZOLE SODIUM 40 MG IV SOLR
40.0000 mg | Freq: Two times a day (BID) | INTRAVENOUS | Status: DC
  Administered 2023-08-22 – 2023-08-23 (×3): 40 mg via INTRAVENOUS
  Filled 2023-08-21 (×4): qty 10

## 2023-08-21 MED ORDER — ACETAMINOPHEN 325 MG PO TABS: 650.0000 mg | ORAL_TABLET | Freq: Four times a day (QID) | ORAL | Status: DC | PRN

## 2023-08-21 MED ORDER — MORPHINE SULFATE (PF) 2 MG/ML IV SOLN: 2.0000 mg | INTRAVENOUS | Status: DC | PRN

## 2023-08-21 MED ORDER — ADULT MULTIVITAMIN W/MINERALS CH
1.0000 | ORAL_TABLET | Freq: Every day | ORAL | Status: DC
  Administered 2023-08-21 – 2023-08-23 (×3): 1 via ORAL
  Filled 2023-08-21 (×3): qty 1

## 2023-08-21 MED ORDER — PANTOPRAZOLE SODIUM 40 MG IV SOLR
80.0000 mg | Freq: Once | INTRAVENOUS | Status: AC
Start: 2023-08-21 — End: 2023-08-21
  Administered 2023-08-21: 80 mg via INTRAVENOUS
  Filled 2023-08-21: qty 20

## 2023-08-21 MED ORDER — SODIUM CHLORIDE 0.9 % IV SOLN
200.0000 mg | INTRAVENOUS | Status: DC
  Filled 2023-08-21 (×2): qty 2

## 2023-08-21 MED ORDER — LACTATED RINGERS IV BOLUS
1000.0000 mL | Freq: Once | INTRAVENOUS | Status: AC
  Administered 2023-08-21: 1000 mL via INTRAVENOUS

## 2023-08-21 MED ORDER — LIDOCAINE VISCOUS HCL 2 % MT SOLN
15.0000 mL | Freq: Once | OROMUCOSAL | Status: AC
  Administered 2023-08-21: 15 mL via ORAL
  Filled 2023-08-21: qty 15

## 2023-08-21 MED ORDER — LORAZEPAM 2 MG/ML IJ SOLN: 1.0000 mg | INTRAMUSCULAR | Status: DC | PRN

## 2023-08-21 MED ORDER — PHENOBARBITAL SODIUM 130 MG/ML IJ SOLN
130.0000 mg | Freq: Once | INTRAMUSCULAR | Status: AC
  Administered 2023-08-21: 130 mg via INTRAVENOUS
  Filled 2023-08-21: qty 1

## 2023-08-21 MED ORDER — PHENOBARBITAL SODIUM 65 MG/ML IJ SOLN
32.5000 mg | Freq: Three times a day (TID) | INTRAMUSCULAR | Status: DC
Start: 2023-08-26 — End: 2023-08-28

## 2023-08-21 MED ORDER — ACETAMINOPHEN 650 MG RE SUPP: 650.0000 mg | Freq: Four times a day (QID) | RECTAL | Status: DC | PRN

## 2023-08-21 MED ORDER — PHENOBARBITAL SODIUM 130 MG/ML IJ SOLN
97.5000 mg | Freq: Three times a day (TID) | INTRAMUSCULAR | Status: DC
  Administered 2023-08-22: 97.5 mg via INTRAVENOUS
  Filled 2023-08-21: qty 1

## 2023-08-21 NOTE — Assessment & Plan Note (Signed)
 Continue Coreg

## 2023-08-21 NOTE — Assessment & Plan Note (Addendum)
 Alcohol use disorder Elevated LFTs suspect alcoholic liver disease Patient with tremors, abnormal LFTs uptrending for the past 2 months We will get acute hepatitis panel Follow RUQ ultrasound Phenobarbital started in the ED.  Will continue

## 2023-08-21 NOTE — H&P (Signed)
 History and Physical    Patient: Edward Powell YNW:[id number] DOB: 05/03/1971 DOA: 08/21/2023 DOS: the patient was seen and examined on 08/21/2023 PCP: Margarita Mail, DO  Patient coming from: Home  Chief Complaint:  Chief Complaint  Patient presents with   Shortness of Breath    HPI: Edward Powell is a 53 y.o. male with medical history significant for CAD s/p CABG, HFrEF with recovered EF 2/2 ischemic cardiomyopathy, a, hypertension, alcohol use disorder and heavy daily drinker, admitted  in December with  chest pain, shortness of breath and nosebleeds, who presents to the ED with a 4 to 5-day history of vomiting, recurrent nosebleeds and shortness of breath.  He states initially his vomitus was gastric contents but then he started vomiting large clots.  According to the wife at bedside who contributes to the history he has also been having a lot of nosebleeds he denies blood in the stool.  On the day of arrival he developed shakes.  His last drink was about 36 hours prior.   Of note, at his hospitalization in December his nosebleeds were attributed to his DAPT therapy for his CAD.  He also had a negative CTA chest.  Also seen by cardiology for shortness of breath, with echo and no further workup.  ENT, Dr. Willeen Cass outpatient follow-up was recommended however wife says stable unclear about dose instructions.  ED course and data review: BP 164/91 with otherwise normal vitals Workup notable for the following: Troponin 15 and BNP 62 Elevated LFTs with AST 370, ALT 123, alk phos 199 and total bilirubin 1.8.(increased from last admission)  Lipase normal at 51 CBC and BMP unremarkable except for mild hypokalemia of 3.3 Respiratory viral panel negative D-dimer elevated at 0.82 EKG, personally viewed and interpreted showing NSR at 82 with no ischemic findings EKG personally viewed and interpreted next 9 chest x-ray was nonacute CTA chest and right upper quadrant ultrasound ordered not yet  resulted Patient was started on phenobarb for alcohol withdrawal and given a dose of IV Protonix due to concern for GI bleed and bolused with 2 L LR.  Also given IV thiamine  Hospitalist consulted for admission for alcohol withdrawal, possible GI bleed, shortness of breath, abnormal LFTs     Past Medical History:  Diagnosis Date   Alcohol abuse    CAD (coronary artery disease)    a. 12/2017 NSTEMI/Cath: Severe 3VD; b. 12/2017 CABG x 4: LIMA->LAD, VG->RI->LCX, VG->RPDA; c. 05/2018 PCI: LM 50d, LAD 50ost, 80p/m, RI nl, LCX 100p, OM2 fills via collats from dLAD, RCA 70p (2.75 x 18 Resolute Onyx DES), 9m, 50m, 60d, VG->OM2 100, LIMA->LAD ok, VG->RPDA 100ost.   Essential hypertension    Hyperlipidemia LDL goal <70    Hypertriglyceridemia    Ischemic cardiomyopathy    a. 05/2018 LV Gram: EF 35-45%; b. 08/2018 Echo: EF 55-60%, nl RV fxn, RVSP 41.48mmHg. Grossly nl MV/TV/AV.   Pulmonary hypertension (HCC)    a. 12/2017 Echo: EF 55-60%; b. 08/2018 Echo: EF 55-60%, RVSP 41.6 mmHg.   Tobacco abuse    Past Surgical History:  Procedure Laterality Date   CORONARY ARTERY BYPASS GRAFT N/A 01/10/2018   Procedure: CORONARY ARTERY BYPASS GRAFTING (CABG);  Surgeon: Delight Ovens, MD;  Location: [facility] OR;  Service: Open Heart Surgery;  Laterality: N/A;  Times 4 using left internal mammary artery to the LAD and endoscopically harvested left saphenous vein to OM1, OM2, and PLB   CORONARY STENT INTERVENTION N/A 06/14/2018   Procedure: CORONARY STENT INTERVENTION;  Surgeon: Iran Ouch, MD;  Location: [facility] INVASIVE CV LAB;  Service: Cardiovascular;  Laterality: N/A;   LEFT HEART CATH AND CORONARY ANGIOGRAPHY N/A 01/09/2018   Procedure: LEFT HEART CATH AND CORONARY ANGIOGRAPHY;  Surgeon: Yvonne Kendall, MD;  Location: [facility] INVASIVE CV LAB;  Service: Cardiovascular;  Laterality: N/A;   LEFT HEART CATH AND CORONARY ANGIOGRAPHY N/A 05/31/2018   Procedure: LEFT HEART CATH AND CORONARY ANGIOGRAPHY;  Surgeon:  Iran Ouch, MD;  Location: [facility] INVASIVE CV LAB;  Service: Cardiovascular;  Laterality: N/A;   NO PAST SURGERIES     TEE WITHOUT CARDIOVERSION N/A 01/10/2018   Procedure: TRANSESOPHAGEAL ECHOCARDIOGRAM (TEE);  Surgeon: Delight Ovens, MD;  Location: [facility] OR;  Service: Open Heart Surgery;  Laterality: N/A;   Social History:  reports that he has quit smoking. His smoking use included cigarettes. He has a 30 pack-year smoking history. He has never used smokeless tobacco. He reports current alcohol use of about 18.0 standard drinks of alcohol per week. He reports that he does not currently use drugs.  No Known Allergies  Family History  Problem Relation Age of Onset   Hypertension Mother    Breast cancer Mother    Hyperlipidemia Mother    Liver cancer Father    Lung cancer Father    Hypertension Father    Atrial fibrillation Father    Stroke Brother    Hypertension Brother    Hyperlipidemia Brother    Heart attack Maternal Grandmother    Stroke Maternal Grandmother    Hyperlipidemia Maternal Grandmother    Hypertension Maternal Grandmother    Stroke Paternal Grandmother    Alzheimer's disease Paternal Grandmother    Hypertension Paternal Grandmother     Prior to Admission medications   Medication Sig Start Date End Date Taking? Authorizing Provider  carvedilol (COREG) 25 MG tablet Take 1 tablet (25 mg total) by mouth 2 (two) times daily with a meal. 05/02/23  Yes Dunn, Raymon Mutton, PA-C  clopidogrel (PLAVIX) 75 MG tablet Take 1 tablet (75 mg total) by mouth daily. 12/21/22  Yes Dunn, Raymon Mutton, PA-C  levothyroxine (SYNTHROID) 25 MCG tablet Take 1 tablet by mouth once daily 06/23/23  Yes Margarita Mail, DO  oxymetazoline (AFRIN) 0.05 % nasal spray Place 2 sprays into both nostrils 2 (two) times daily. 05/24/23 05/23/24  Kathrynn Running, MD    Physical Exam: Vitals:   08/21/23 1253 08/21/23 1254 08/21/23 1537 08/21/23 1800  BP: (!) 164/91  (!) 166/104 (!) 167/95  Pulse: 69  68 60   Resp: 19  18 16   Temp: (!) 97.4 F (36.3 C)  98 F (36.7 C) 98.4 F (36.9 C)  TempSrc: Oral   Oral  SpO2: 96%  95% 96%  Weight:  89.8 kg    Height:  5\' 8"  (1.727 m)     Physical Exam Vitals and nursing note reviewed.  Constitutional:      General: He is not in acute distress. HENT:     Head: Normocephalic and atraumatic.  Cardiovascular:     Rate and Rhythm: Normal rate and regular rhythm.     Heart sounds: Normal heart sounds.  Pulmonary:     Effort: Pulmonary effort is normal.     Breath sounds: Normal breath sounds.  Abdominal:     Palpations: Abdomen is soft.     Tenderness: There is no abdominal tenderness.  Neurological:     Mental Status: Mental status is at baseline.     Motor: Tremor present.  Labs on Admission: I have personally reviewed following labs and imaging studies  CBC: Recent Labs  Lab 08/21/23 1258  WBC 7.1  HGB 14.4  HCT 40.6  MCV 89.4  PLT 72*   Basic Metabolic Panel: Recent Labs  Lab 08/21/23 1258  NA 139  K 3.3*  CL 100  CO2 25  GLUCOSE 132*  BUN 11  CREATININE 0.68  CALCIUM 9.1   GFR: Estimated Creatinine Clearance: 115 mL/min (by C-G formula based on SCr of 0.68 mg/dL). Liver Function Tests: Recent Labs  Lab 08/21/23 1258  AST 370*  ALT 123*  ALKPHOS 199*  BILITOT 1.8*  PROT 7.9  ALBUMIN 3.5   Recent Labs  Lab 08/21/23 1258  LIPASE 51   Recent Labs  Lab 08/21/23 1812  AMMONIA 38*   Coagulation Profile: No results for input(s): "INR", "PROTIME" in the last 168 hours. Cardiac Enzymes: No results for input(s): "CKTOTAL", "CKMB", "CKMBINDEX", "TROPONINI" in the last 168 hours. BNP (last 3 results) No results for input(s): "PROBNP" in the last 8760 hours. HbA1C: No results for input(s): "HGBA1C" in the last 72 hours. CBG: No results for input(s): "GLUCAP" in the last 168 hours. Lipid Profile: No results for input(s): "CHOL", "HDL", "LDLCALC", "TRIG", "CHOLHDL", "LDLDIRECT" in the last 72  hours. Thyroid Function Tests: No results for input(s): "TSH", "T4TOTAL", "FREET4", "T3FREE", "THYROIDAB" in the last 72 hours. Anemia Panel: No results for input(s): "VITAMINB12", "FOLATE", "FERRITIN", "TIBC", "IRON", "RETICCTPCT" in the last 72 hours. Urine analysis:    Component Value Date/Time   COLORURINE YELLOW 01/10/2018 0500   APPEARANCEUR CLEAR 01/10/2018 0500   LABSPEC 1.023 01/10/2018 0500   PHURINE 5.0 01/10/2018 0500   GLUCOSEU NEGATIVE 01/10/2018 0500   HGBUR NEGATIVE 01/10/2018 0500   BILIRUBINUR neg 04/25/2023 1426   KETONESUR NEGATIVE 01/10/2018 0500   PROTEINUR Negative 04/25/2023 1426   PROTEINUR NEGATIVE 01/10/2018 0500   UROBILINOGEN 0.2 04/25/2023 1426   NITRITE neg 04/25/2023 1426   NITRITE NEGATIVE 01/10/2018 0500   LEUKOCYTESUR Negative 04/25/2023 1426    Radiological Exams on Admission: DG Chest 2 View Result Date: 08/21/2023 CLINICAL DATA:  Chest pain and shortness of breath EXAM: CHEST - 2 VIEW COMPARISON:  Chest radiograph 05/22/2023 FINDINGS: Cardiomegaly. Status post median sternotomy. No large area pulmonary consolidation. No pleural effusion or pneumothorax. Thoracic spine degenerative changes. IMPRESSION: Cardiomegaly. No acute cardiopulmonary process. Electronically Signed   By: Annia Belt M.D.   On: 08/21/2023 16:02     Data Reviewed: Relevant notes from primary care and specialist visits, past discharge summaries as available in EHR, including Care Everywhere. Prior diagnostic testing as pertinent to current admission diagnoses Updated medications and problem lists for reconciliation ED course, including vitals, labs, imaging, treatment and response to treatment Triage notes, nursing and pharmacy notes and ED provider's notes Notable results as noted in HPI   Assessment and Plan: * Hematemesis Vomiting possibly related to alcoholic gastritis Given history of heavy alcohol use, concern for hypertensive gastropathy, varices, abdominal prior  history of cirrhosis Continue IV Protonix Serial H&H and transfuse if needed GI consult for consideration of EGD.  Will keep n.p.o.  Alcohol withdrawal (HCC) Alcohol use disorder Elevated LFTs suspect alcoholic liver disease Patient with tremors, abnormal LFTs uptrending for the past 2 months We will get acute hepatitis panel Follow RUQ ultrasound Phenobarbital started in the ED.  Will continue  CAD S/P percutaneous coronary angioplasty Has occasional chest pain.  EKG nonacute.  Troponin negative Continue Coreg  Recurrent epistaxis Will need ENT  follow-up  HFrEF (heart failure with reduced ejection fraction) (HCC) Ischemic cardiomyopathy (recovered EF 55 to 60% 05/2023)) Clinically compensated Continue carvedilol  Chronic dyspnea Uncertain etiology, similar to past presentation ruled out for PE CHF exacerbation not suspected as patient clinically euvolemic, normal BNP, chest x-ray nonacute Follow-up current CTA chest Might benefit from outpatient pulmonology referral  Essential hypertension Continue Coreg     DVT prophylaxis: SCD  Consults: Gi, Dr Allegra Lai  Advance Care Planning:   Code Status: Prior   Family Communication: none  Disposition Plan: Back to previous home environment  Severity of Illness: The appropriate patient status for this patient is OBSERVATION. Observation status is judged to be reasonable and necessary in order to provide the required intensity of service to ensure the patient's safety. The patient's presenting symptoms, physical exam findings, and initial radiographic and laboratory data in the context of their medical condition is felt to place them at decreased risk for further clinical deterioration. Furthermore, it is anticipated that the patient will be medically stable for discharge from the hospital within 2 midnights of admission.   Author: Andris Baumann, MD 08/21/2023 8:54 PM  For on call review [email or web address].

## 2023-08-21 NOTE — Assessment & Plan Note (Addendum)
 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  Will keep n.p.o.

## 2023-08-21 NOTE — Assessment & Plan Note (Addendum)
 Ischemic cardiomyopathy (recovered EF 55 to 60% 05/2023)) Clinically compensated Continue carvedilol

## 2023-08-21 NOTE — ED Provider Notes (Signed)
 [facility] Provider Note    Event Date/Time   First MD Initiated Contact with Patient 08/21/23 [phone]     (approximate)   History   Shortness of Breath   HPI  Edward Powell is a 53 y.o. male with history of alcohol abuse, CAD, hypertension, hyperlipidemia, CHF, presenting with chest pain and shortness of breath.  Her wife patient had been complaining of this for last couple days.  States that he has been having a lot of nosebleeds and when he vomited the obese some blood and clots in it.  Thinks that he may have some blood in his stool on Saturday but does not clearly remember.  Does not remember if his stools black or red more recently.  States that pain is in his epigastric region and substernal region.  Last drink was yesterday.  He states that he had a similar presentation in December with the shortness of breath and was admitted for that.  He has had decreased p.o. and intake and has been unable to take pills because of his nausea vomiting.  On independent review, he had an echo done in December that showed EF of 55 to 60% with grade 1 diastolic dysfunction.  During his admission in December, he was admitted for recurrent epistaxis as well as shortness of breath, had a CT PE study done that was negative.  The epistaxis was thought to be secondary to his aspirin, Plavix and as well as excessive alcohol use.     Physical Exam   Triage Vital Signs: ED Triage Vitals  Encounter Vitals Group     BP 08/21/23 1253 (!) 164/91     Systolic BP Percentile --      Diastolic BP Percentile --      Pulse Rate 08/21/23 1253 69     Resp 08/21/23 1253 19     Temp 08/21/23 1253 (!) 97.4 F (36.3 C)     Temp Source 08/21/23 1253 Oral     SpO2 08/21/23 1253 96 %     Weight 08/21/23 1254 198 lb (89.8 kg)     Height 08/21/23 1254 5\' 8"  (1.727 m)     Head Circumference --      Peak Flow --      Pain Score 08/21/23 1254 7     Pain Loc --      Pain Education --       Exclude from Growth Chart --     Most recent vital signs: Vitals:   08/21/23 1537 08/21/23 1800  BP: (!) 166/104 (!) 167/95  Pulse: 68 60  Resp: 18 16  Temp: 98 F (36.7 C) 98.4 F (36.9 C)  SpO2: 95% 96%     General: Awake, no distress.  CV:  Good peripheral perfusion.  Resp:  Normal effort.  No increased work of breathing, clear Abd:  No distention.  Soft, mildly tender in the epigastric region, no right upper quadrant or right lower quadrant abdominal pain, ventral hernia as well as umbilical hernia that is easily reducible. Other:  Dry mucous membranes, tongue fasciculations as well as bilateral hand tremors.  No active epistaxis at this time.  Rectal exam was done with a chaperone, no stool in rectal vault, no bloody mucus noted.   ED Results / Procedures / Treatments   Labs (all labs ordered are listed, but only abnormal results are displayed) Labs Reviewed  BASIC METABOLIC PANEL - Abnormal; Notable for the following components:  Result Value   Potassium 3.3 (*)    Glucose, Bld 132 (*)    All other components within normal limits  CBC - Abnormal; Notable for the following components:   Platelets 72 (*)    All other components within normal limits  HEPATIC FUNCTION PANEL - Abnormal; Notable for the following components:   AST 370 (*)    ALT 123 (*)    Alkaline Phosphatase 199 (*)    Total Bilirubin 1.8 (*)    Bilirubin, Direct 0.5 (*)    Indirect Bilirubin 1.3 (*)    All other components within normal limits  AMMONIA - Abnormal; Notable for the following components:   Ammonia 38 (*)    All other components within normal limits  D-DIMER, QUANTITATIVE - Abnormal; Notable for the following components:   D-Dimer, Quant 0.82 (*)    All other components within normal limits  ETHANOL - Abnormal; Notable for the following components:   Alcohol, Ethyl (B) 73 (*)    All other components within normal limits  RESP PANEL BY RT-PCR (RSV, FLU A&B, COVID)  [id number]   BRAIN NATRIURETIC PEPTIDE  LIPASE, BLOOD  TROPONIN I (HIGH SENSITIVITY)  TROPONIN I (HIGH SENSITIVITY)     EKG  Sinus rhythm, rate 65, normal QS, normal QTc, no ischemic ST elevation, significant compared to prior   RADIOLOGY CT PE study on my interpretation without obvious PE, this x-ray on my interpretation without obvious consolidation   PROCEDURES:  Critical Care performed: Yes, see critical care procedure note(s)  .Critical Care  Performed by: Claybon Jabs, MD Authorized by: Claybon Jabs, MD   Critical care provider statement:    Critical care time (minutes):  40   Critical care was necessary to treat or prevent imminent or life-threatening deterioration of the following conditions: alcohol withdrawal.   Critical care was time spent personally by me on the following activities:  Development of treatment plan with patient or surrogate, discussions with consultants, evaluation of patient's response to treatment, examination of patient, ordering and review of laboratory studies, ordering and review of radiographic studies, ordering and performing treatments and interventions, pulse oximetry, re-evaluation of patient's condition and review of old charts    MEDICATIONS ORDERED IN ED: Medications  PHENObarbital (LUMINAL) injection 130 mg (130 mg Intravenous Given 08/21/23 1817)  thiamine (VITAMIN B1) injection 100 mg (100 mg Intravenous Given 08/21/23 1817)  lactated ringers bolus 1,000 mL (0 mLs Intravenous Stopped 08/21/23 1918)  alum & mag hydroxide-simeth (MAALOX/MYLANTA) 200-200-20 MG/5ML suspension 30 mL (30 mLs Oral Given 08/21/23 1805)    And  lidocaine (XYLOCAINE) 2 % viscous mouth solution 15 mL (15 mLs Oral Given 08/21/23 1805)  pantoprazole (PROTONIX) injection 80 mg (80 mg Intravenous Given 08/21/23 1813)  ondansetron (ZOFRAN) injection 4 mg (4 mg Intravenous Given 08/21/23 1817)  iohexol (OMNIPAQUE) 350 MG/ML injection 75 mL (75 mLs Intravenous Contrast Given 08/21/23 1937)      IMPRESSION / MDM / ASSESSMENT AND PLAN / ED COURSE  I reviewed the triage vital signs and the nursing notes.                              Differential diagnosis includes, but is not limited to, alcohol withdrawal, electrolyte derangements, dehydration, for his chest pain or shortness of breath, considered ACS, considered CHF but he does not appear fluid overloaded at this time, considered infection such as pneumonia or viral illness.  Also considered PE  but he is not hypoxic, not tachypneic, no unilateral calf swelling or tenderness, no other respecters for PE.  Will get a D-dimer.  No evidence of epistaxis at this time.  He states that he had some blood and clots in his vomitus while he is having the nosebleed, consider that he might be having hematemesis due to the epistaxis also given reported history of some blood in his stool several days back, also consider GI bleed.  Will give him a GI cocktail, Protonix.  IV fluids as well as phenobarbital.  Patient's presentation is most consistent with acute presentation with potential threat to life or bodily function.  Independent review of labs and imaging are below.  No bloody stool or hematemesis while here in the emergency department.  Given patient's alcohol withdrawal, potential GI bleed, shortness of breath, transaminitis, he will need to be admitted for further management.  Consult to hospitalist was agreeable with plan for admission and will evaluate the patient.  He is admitted.  Clinical Course as of 08/21/23 2019  Mon Aug 21, 2023  1702 DG Chest 2 View IMPRESSION: Cardiomegaly. No acute cardiopulmonary process.   [TT]  1751 Independent review of labs, electrolytes are not severely deranged, creatinine is normal, no leukocytosis, his platelets are low, and seems to be downtrending compared to prior.  His BUN and troponin are not elevated, his LFTs are elevated.  Considered alcohol hepatitis, cirrhosis.  Will get a right upper quadrant  ultrasound. [TT]  1915 D-Dimer, Quant(!): 0.82 D-dimer is elevated, will get a CT PE study. [TT]    Clinical Course User Index [TT] Jodie Echevaria Franchot Erichsen, MD     FINAL CLINICAL IMPRESSION(S) / ED DIAGNOSES   Final diagnoses:  Chest pain, unspecified type  SOB (shortness of breath)  Alcohol abuse  Alcohol withdrawal syndrome with complication (HCC)  Nausea and vomiting, unspecified vomiting type  Hematemesis, unspecified whether nausea present     Rx / DC Orders   ED Discharge Orders     None        Note:  This document was prepared using Dragon voice recognition software and may include unintentional dictation errors.    Claybon Jabs, MD 08/21/23 2019

## 2023-08-21 NOTE — Assessment & Plan Note (Signed)
 Has occasional chest pain.  EKG nonacute.  Troponin negative Continue Coreg Will hold DAPT

## 2023-08-21 NOTE — ED Triage Notes (Signed)
 Pt sts that he has been SOB for the last several days. Pt sts that he has seen for the same thing back at the beginning of December. Pt sts that he has also been having vomiting with trying to eat, drink or even take his pills.

## 2023-08-21 NOTE — Assessment & Plan Note (Signed)
 Uncertain etiology, similar to past presentation ruled out for PE CHF exacerbation not suspected as patient clinically euvolemic, normal BNP, chest x-ray nonacute Follow-up current CTA chest Might benefit from outpatient pulmonology referral

## 2023-08-22 ENCOUNTER — Observation Stay: Admitting: Anesthesiology

## 2023-08-22 ENCOUNTER — Encounter: Payer: Self-pay | Admitting: Internal Medicine

## 2023-08-22 ENCOUNTER — Encounter
Admission: EM | Disposition: A | Discharge status: Home / Self Care | Payer: Self-pay | Source: Home / Self Care | Attending: Emergency Medicine

## 2023-08-22 DIAGNOSIS — I5023 Acute on chronic systolic (congestive) heart failure: Secondary | ICD-10-CM | POA: Diagnosis not present

## 2023-08-22 DIAGNOSIS — R7989 Other specified abnormal findings of blood chemistry: Secondary | ICD-10-CM

## 2023-08-22 DIAGNOSIS — I2510 Atherosclerotic heart disease of native coronary artery without angina pectoris: Secondary | ICD-10-CM | POA: Diagnosis not present

## 2023-08-22 DIAGNOSIS — K920 Hematemesis: Secondary | ICD-10-CM | POA: Diagnosis not present

## 2023-08-22 DIAGNOSIS — I8500 Esophageal varices without bleeding: Secondary | ICD-10-CM

## 2023-08-22 DIAGNOSIS — T18108A Unspecified foreign body in esophagus causing other injury, initial encounter: Secondary | ICD-10-CM | POA: Diagnosis not present

## 2023-08-22 DIAGNOSIS — I110 Hypertensive heart disease with heart failure: Secondary | ICD-10-CM | POA: Diagnosis not present

## 2023-08-22 DIAGNOSIS — K3189 Other diseases of stomach and duodenum: Secondary | ICD-10-CM | POA: Diagnosis not present

## 2023-08-22 DIAGNOSIS — F1010 Alcohol abuse, uncomplicated: Secondary | ICD-10-CM | POA: Diagnosis not present

## 2023-08-22 HISTORY — PX: ESOPHAGOGASTRODUODENOSCOPY: SHX5428

## 2023-08-22 LAB — CBC WITH DIFFERENTIAL/PLATELET
Abs Immature Granulocytes: 0.02 10*3/uL (ref 0.00–0.07)
Basophils Absolute: 0 10*3/uL (ref 0.0–0.1)
Basophils Relative: 1 %
Eosinophils Absolute: 0.1 10*3/uL (ref 0.0–0.5)
Eosinophils Relative: 3 %
HCT: 38.9 % — ABNORMAL LOW (ref 39.0–52.0)
Hemoglobin: 13.5 g/dL (ref 13.0–17.0)
Immature Granulocytes: 1 %
Lymphocytes Relative: 24 %
Lymphs Abs: 1 10*3/uL (ref 0.7–4.0)
MCH: 31.5 pg (ref 26.0–34.0)
MCHC: 34.7 g/dL (ref 30.0–36.0)
MCV: 90.9 fL (ref 80.0–100.0)
Monocytes Absolute: 0.4 10*3/uL (ref 0.1–1.0)
Monocytes Relative: 8 %
Neutro Abs: 2.6 10*3/uL (ref 1.7–7.7)
Neutrophils Relative %: 63 %
Platelets: 39 10*3/uL — ABNORMAL LOW (ref 150–400)
RBC: 4.28 MIL/uL (ref 4.22–5.81)
RDW: 15 % (ref 11.5–15.5)
WBC: 4.2 10*3/uL (ref 4.0–10.5)
nRBC: 0 % (ref 0.0–0.2)

## 2023-08-22 LAB — COMPREHENSIVE METABOLIC PANEL
ALT: 99 U/L — ABNORMAL HIGH (ref 0–44)
AST: 253 U/L — ABNORMAL HIGH (ref 15–41)
Albumin: 3.1 g/dL — ABNORMAL LOW (ref 3.5–5.0)
Alkaline Phosphatase: 175 U/L — ABNORMAL HIGH (ref 38–126)
Anion gap: 9 (ref 5–15)
BUN: 10 mg/dL (ref 6–20)
CO2: 26 mmol/L (ref 22–32)
Calcium: 8.5 mg/dL — ABNORMAL LOW (ref 8.9–10.3)
Chloride: 100 mmol/L (ref 98–111)
Creatinine, Ser: 0.67 mg/dL (ref 0.61–1.24)
GFR, Estimated: >60 mL/min (ref >60)
Glucose, Bld: 97 mg/dL (ref 70–99)
Potassium: 3.4 mmol/L — ABNORMAL LOW (ref 3.5–5.1)
Sodium: 135 mmol/L (ref 135–145)
Total Bilirubin: 3 mg/dL — ABNORMAL HIGH (ref 0.0–1.2)
Total Protein: 7.4 g/dL (ref 6.5–8.1)

## 2023-08-22 LAB — HEMOGLOBIN
Hemoglobin: 13 g/dL (ref 13.0–17.0)
Hemoglobin: 13.1 g/dL (ref 13.0–17.0)

## 2023-08-22 LAB — HEPATITIS PANEL, ACUTE
HCV Ab: NONREACTIVE
Hep A IgM: NONREACTIVE
Hep B C IgM: NONREACTIVE
Hepatitis B Surface Ag: NONREACTIVE

## 2023-08-22 SURGERY — EGD (ESOPHAGOGASTRODUODENOSCOPY)
Anesthesia: General

## 2023-08-22 MED ORDER — LORAZEPAM 2 MG/ML IJ SOLN: 1.0000 mg | INTRAMUSCULAR | Status: DC | PRN

## 2023-08-22 MED ORDER — DEXMEDETOMIDINE HCL IN NACL 80 MCG/20ML IV SOLN
INTRAVENOUS | Status: DC | PRN
  Administered 2023-08-22: 4 ug via INTRAVENOUS

## 2023-08-22 MED ORDER — LORAZEPAM 1 MG PO TABS: 1.0000 mg | ORAL_TABLET | ORAL | Status: DC | PRN

## 2023-08-22 MED ORDER — SODIUM CHLORIDE 0.9 % IV SOLN: INTRAVENOUS | Status: DC

## 2023-08-22 MED ORDER — PROPOFOL 10 MG/ML IV BOLUS
INTRAVENOUS | Status: DC | PRN
  Administered 2023-08-22 (×2): 100 mg via INTRAVENOUS

## 2023-08-22 MED ORDER — PROPOFOL 10 MG/ML IV BOLUS
INTRAVENOUS | Status: AC
  Filled 2023-08-22: qty 20

## 2023-08-22 NOTE — ED Notes (Signed)
 CCMD called to place pt on monitor

## 2023-08-22 NOTE — Consult Note (Signed)
 Midge Minium, MD [facility]  [address] [place], [place] [address] Phone: [phone] Fax : [phone]  Consultation  Referring Provider:     Dr. Para March Primary Care Physician:  Margarita Mail, DO Primary Gastroenterologist: Gentry Fitz         Reason for Consultation:     Hematemesis  Date of Admission:  08/21/2023 Date of Consultation:  08/22/2023         HPI:   Edward Powell is a 53 y.o. male who has a history of coronary artery disease and nosebleeds in the past.  The patient reports that he has been taking NSAIDs and had been having a 35 pound weight loss over the last month due to his inability to eat.  He states that he has epigastric pain and he vomits a lot.  The patient also has thrombocytopenia and the patient has been told to stop drinking but has not. The patient came to the emergency department in December for shortness of breath and chest pain with a 4 to 5-day history of vomiting with recurrent nosebleeds.  He now comes in with a report that his last drink was 36 hours prior to being admitted.  He was started on a protocol for alcohol withdrawal since he has had the shakes in the past.  He reports that he has been following with ENT and sees Dr. Willeen Cass but continues to have nosebleeds. He comes in with his wife today who reports that he takes Tylenol but the patient interjected and reported that he also takes ibuprofen. The patient had an ultrasound of the right upper quadrant that showed cholelithiasis without complicating factors and fatty liver.  He also had a negative CT angiogram for pulmonary embolism which also showed fatty liver. The patient has had abnormal liver enzymes consistent with alcoholic hepatitis on the previous admission and again on this admission.  He also has thrombocytopenia which may indicative of more advanced liver disease.  Past Medical History:  Diagnosis Date   Alcohol abuse    CAD (coronary artery disease)    a. 12/2017  NSTEMI/Cath: Severe 3VD; b. 12/2017 CABG x 4: LIMA->LAD, VG->RI->LCX, VG->RPDA; c. 05/2018 PCI: LM 50d, LAD 50ost, 80p/m, RI nl, LCX 100p, OM2 fills via collats from dLAD, RCA 70p (2.75 x 18 Resolute Onyx DES), 45m, 4m, 60d, VG->OM2 100, LIMA->LAD ok, VG->RPDA 100ost.   Essential hypertension    Hyperlipidemia LDL goal <70    Hypertriglyceridemia    Ischemic cardiomyopathy    a. 05/2018 LV Gram: EF 35-45%; b. 08/2018 Echo: EF 55-60%, nl RV fxn, RVSP 41.19mmHg. Grossly nl MV/TV/AV.   Pulmonary hypertension (HCC)    a. 12/2017 Echo: EF 55-60%; b. 08/2018 Echo: EF 55-60%, RVSP 41.6 mmHg.   Tobacco abuse     Past Surgical History:  Procedure Laterality Date   CORONARY ARTERY BYPASS GRAFT N/A 01/10/2018   Procedure: CORONARY ARTERY BYPASS GRAFTING (CABG);  Surgeon: Delight Ovens, MD;  Location: [facility] OR;  Service: Open Heart Surgery;  Laterality: N/A;  Times 4 using left internal mammary artery to the LAD and endoscopically harvested left saphenous vein to OM1, OM2, and PLB   CORONARY STENT INTERVENTION N/A 06/14/2018   Procedure: CORONARY STENT INTERVENTION;  Surgeon: Iran Ouch, MD;  Location: [facility] INVASIVE CV LAB;  Service: Cardiovascular;  Laterality: N/A;   LEFT HEART CATH AND CORONARY ANGIOGRAPHY N/A 01/09/2018   Procedure: LEFT HEART CATH AND CORONARY ANGIOGRAPHY;  Surgeon: Yvonne Kendall, MD;  Location:  [facility] INVASIVE CV LAB;  Service: Cardiovascular;  Laterality: N/A;   LEFT HEART CATH AND CORONARY ANGIOGRAPHY N/A 05/31/2018   Procedure: LEFT HEART CATH AND CORONARY ANGIOGRAPHY;  Surgeon: Iran Ouch, MD;  Location: [facility] INVASIVE CV LAB;  Service: Cardiovascular;  Laterality: N/A;   NO PAST SURGERIES     TEE WITHOUT CARDIOVERSION N/A 01/10/2018   Procedure: TRANSESOPHAGEAL ECHOCARDIOGRAM (TEE);  Surgeon: Delight Ovens, MD;  Location: [facility] OR;  Service: Open Heart Surgery;  Laterality: N/A;    Prior to Admission medications   Medication Sig Start Date End Date Taking?  Authorizing Provider  carvedilol (COREG) 25 MG tablet Take 1 tablet (25 mg total) by mouth 2 (two) times daily with a meal. 05/02/23  Yes Dunn, Raymon Mutton, PA-C  clopidogrel (PLAVIX) 75 MG tablet Take 1 tablet (75 mg total) by mouth daily. 12/21/22  Yes Dunn, Raymon Mutton, PA-C  levothyroxine (SYNTHROID) 25 MCG tablet Take 1 tablet by mouth once daily 06/23/23  Yes Margarita Mail, DO  oxymetazoline (AFRIN) 0.05 % nasal spray Place 2 sprays into both nostrils 2 (two) times daily. 05/24/23 05/23/24 Yes Wouk, Wilfred Curtis, MD    Family History  Problem Relation Age of Onset   Hypertension Mother    Breast cancer Mother    Hyperlipidemia Mother    Liver cancer Father    Lung cancer Father    Hypertension Father    Atrial fibrillation Father    Stroke Brother    Hypertension Brother    Hyperlipidemia Brother    Heart attack Maternal Grandmother    Stroke Maternal Grandmother    Hyperlipidemia Maternal Grandmother    Hypertension Maternal Grandmother    Stroke Paternal Grandmother    Alzheimer's disease Paternal Grandmother    Hypertension Paternal Grandmother      Social History   Tobacco Use   Smoking status: Former    Current packs/day: 1.00    Average packs/day: 1 pack/day for 30.0 years (30.0 ttl pk-yrs)    Types: Cigarettes   Smokeless tobacco: Never   Tobacco comments:    last cigarette was day of the heart attack  Vaping Use   Vaping status: Never Used  Substance Use Topics   Alcohol use: Yes    Alcohol/week: 18.0 standard drinks of alcohol    Types: 18 Shots of liquor per week    Comment: up to 8-9 shots twice weekly - vodka   Drug use: Not Currently    Allergies as of 08/21/2023   (No Known Allergies)    Review of Systems:    All systems reviewed and negative except where noted in HPI.   Physical Exam:  Vital signs in last 24 hours: Temp:  [97.4 F (36.3 C)-98.4 F (36.9 C)] 98.2 F (36.8 C) (03/04 0544) Pulse Rate:  [58-74] 59 (03/04 0500) Resp:  [12-19] 14  (03/04 0500) BP: (164-176)/(91-107) 165/96 (03/04 0500) SpO2:  [92 %-97 %] 92 % (03/04 0500) Weight:  [89.8 kg] 89.8 kg (03/03 1254)   General:   Pleasant, cooperative in NAD Head:  Normocephalic and atraumatic. Eyes:   No icterus.   Conjunctiva pink. PERRLA. Ears:  Normal auditory acuity. Neck:  Supple; no masses or thyroidomegaly Lungs: Respirations even and unlabored. Lungs clear to auscultation bilaterally.   No wheezes, crackles, or rhonchi.  Heart:  Regular rate and rhythm;  Without murmur, clicks, rubs or gallops Abdomen:  Soft, nondistended, nontender. Normal bowel sounds. No appreciable masses or hepatomegaly.  No rebound or guarding.  Rectal:  Not performed. Msk:  Symmetrical without gross deformities.    Extremities:  Without edema, cyanosis or clubbing. Neurologic:  Alert and oriented x3;  grossly normal neurologically. Skin:  Intact without significant lesions or rashes. Cervical Nodes:  No significant cervical adenopathy. Psych:  Alert and cooperative. Normal affect.  LAB RESULTS: Recent Labs    08/21/23 1258 08/22/23 0007 08/22/23 0537  WBC 7.1  --   --   HGB 14.4 13.1 13.0  HCT 40.6  --   --   PLT 72*  --   --    BMET Recent Labs    08/21/23 1258 08/22/23 0537  NA 139 135  K 3.3* 3.4*  CL 100 100  CO2 25 26  GLUCOSE 132* 97  BUN 11 10  CREATININE 0.68 0.67  CALCIUM 9.1 8.5*   LFT Recent Labs    08/21/23 1258 08/22/23 0537  PROT 7.9 7.4  ALBUMIN 3.5 3.1*  AST 370* 253*  ALT 123* 99*  ALKPHOS 199* 175*  BILITOT 1.8* 3.0*  BILIDIR 0.5*  --   IBILI 1.3*  --    PT/INR No results for input(s): "LABPROT", "INR" in the last 72 hours.  STUDIES: CT Angio Chest PE W/Cm &/Or Wo Cm Result Date: 08/21/2023 CLINICAL DATA:  Shortness of breath. EXAM: CT ANGIOGRAPHY CHEST WITH CONTRAST TECHNIQUE: Multidetector CT imaging of the chest was performed using the standard protocol during bolus administration of intravenous contrast. Multiplanar CT image  reconstructions and MIPs were obtained to evaluate the vascular anatomy. RADIATION DOSE REDUCTION: This exam was performed according to the departmental dose-optimization program which includes automated exposure control, adjustment of the mA and/or kV according to patient size and/or use of iterative reconstruction technique. CONTRAST:  75mL OMNIPAQUE IOHEXOL 350 MG/ML SOLN COMPARISON:  May 22, 2023 FINDINGS: Cardiovascular: There is moderate to marked severity calcification of the aortic arch, without evidence of aortic aneurysm. Satisfactory opacification of the pulmonary arteries to the segmental level. No evidence of pulmonary embolism. Normal heart size. A coronary artery stent is in place. No pericardial effusion. Mediastinum/Nodes: No enlarged mediastinal, hilar, or axillary lymph nodes. Thyroid gland, trachea, and esophagus demonstrate no significant findings. Lungs/Pleura: Lungs are clear. No pleural effusion or pneumothorax. Upper Abdomen: There is diffuse fatty infiltration of the liver parenchyma. Musculoskeletal: Multiple sternal wires are noted with chronic and postoperative changes seen involving the inferior aspect of the sternum. A stable associated 2.4 cm x 5.2 cm x 8.8 cm fat containing ventral hernia is noted. Review of the MIP images confirms the above findings. IMPRESSION: 1. No evidence of pulmonary embolism or other acute intrathoracic process. 2. Hepatic steatosis. 3. Aortic atherosclerosis. Aortic Atherosclerosis (ICD10-I70.0). Electronically Signed   By: Aram Candela M.D.   On: 08/21/2023 22:14   US Abdomen Limited RUQ (LIVER/GB) Result Date: 08/21/2023 CLINICAL DATA:  Elevated LFTs EXAM: ULTRASOUND ABDOMEN LIMITED RIGHT UPPER QUADRANT COMPARISON:  None Available. FINDINGS: Gallbladder: Gallbladder is well distended with a few small gallstones. No wall thickening or pericholecystic fluid is noted. Negative sonographic Murphy's sign is elicited. Common bile duct: Not well  visualized Liver: Increased in echogenicity consistent with fatty infiltration. No discrete mass is noted. Portal vein is patent on color Doppler imaging with normal direction of blood flow towards the liver. Other: None. IMPRESSION: Cholelithiasis without complicating factors. Fatty liver. Electronically Signed   By: Alcide Clever M.D.   On: 08/21/2023 21:55   DG Chest 2 View Result Date: 08/21/2023 CLINICAL DATA:  Chest pain and shortness of breath  EXAM: CHEST - 2 VIEW COMPARISON:  Chest radiograph 05/22/2023 FINDINGS: Cardiomegaly. Status post median sternotomy. No large area pulmonary consolidation. No pleural effusion or pneumothorax. Thoracic spine degenerative changes. IMPRESSION: Cardiomegaly. No acute cardiopulmonary process. Electronically Signed   By: Annia Belt M.D.   On: 08/21/2023 16:02      Impression / Plan:   Assessment: Principal Problem:   Hematemesis Active Problems:   CAD S/P percutaneous coronary angioplasty   Essential hypertension   Alcohol use disorder   Abnormal LFTs   Recurrent epistaxis   Chronic dyspnea   Alcohol withdrawal (HCC)   HFrEF (heart failure with reduced ejection fraction) (HCC)   TIA (transient ischemic attack)   Edward Powell is a 53 y.o. y/o male with a history of nosebleeds on anticoagulation but he also has had abdominal pain with 35 pound weight loss in the last month and chronic vomiting.  Only recently has the vomitus turned to be bloody.  The patient has never seen a GI doctor nor has he had a colonoscopy in the past.  Plan:  Upper GI bleed: The patient has been n.p.o. and will be set up for an upper endoscopy for today.  The patient has been told that his differential diagnosis includes possible continued nosebleeds causing him to vomit the blood versus peptic ulcer disease with his history of epigastric pain with NSAID use and antiplatelet therapy.  He has also been told to stop NSAID use due to his risk of bleeding.  Abnormal liver  enzymes: The patient has been told that he should refrain from further alcohol abuse/use due to his likelihood of advanced liver disease with him being on blood thinners.  PPI IV twice daily  Continue serial CBCs and transfuse PRN Avoid NSAIDs Maintain 2 large-bore IV lines Please page GI with any acute hemodynamic changes, or signs of active GI bleeding   Thank you for involving me in the care of this patient.      LOS: 0 days   Midge Minium, MD, MD. Clementeen Graham 08/22/2023, 7:57 AM,  Pager [phone] 7am-5pm  Check AMION for 5pm -7am coverage and on weekends   Note: This dictation was prepared with Dragon dictation along with smaller phrase technology. Any transcriptional errors that result from this process are unintentional.

## 2023-08-22 NOTE — Transfer of Care (Signed)
 Immediate Anesthesia Transfer of Care Note  Patient: Edward Powell  Procedure(s) Performed: ESOPHAGOGASTRODUODENOSCOPY (EGD)  Patient Location: PACU and Endoscopy Unit  Anesthesia Type:General  Level of Consciousness: awake, alert , oriented, and patient cooperative  Airway & Oxygen Therapy: Patient Spontanous Breathing  Post-op Assessment: Report given to RN and Post -op Vital signs reviewed and stable  Post vital signs: Reviewed and stable  Last Vitals:  Vitals Value Taken Time  BP 104/65 08/22/23 1418  Temp 36.1 C 08/22/23 1417  Pulse 67 08/22/23 1418  Resp 17 08/22/23 1418  SpO2 91 % 08/22/23 1418  Vitals shown include unfiled device data.  Last Pain:  Vitals:   08/22/23 1417  TempSrc: Temporal  PainSc: Asleep         Complications: No notable events documented.

## 2023-08-22 NOTE — Plan of Care (Signed)

## 2023-08-22 NOTE — Assessment & Plan Note (Signed)
 Will need ENT follow-up

## 2023-08-22 NOTE — Op Note (Signed)
 [facility] Gastroenterology Patient Name: Edward Powell Procedure Date: 08/22/2023 1:56 PM MRN: [id number] Account #: [id number] Date of Birth: 08/12/1970 Admit Type: Outpatient Age: 53 Room: [facility] ENDO ROOM 4 Gender: Male Note Status: Finalized Instrument Name: Upper Endoscope [id number] Procedure:             Upper GI endoscopy Indications:           Hematemesis Providers:             Midge Minium MD, MD Medicines:             Propofol per Anesthesia Complications:         No immediate complications. Procedure:             Pre-Anesthesia Assessment:                        - Prior to the procedure, a History and Physical was                         performed, and patient medications and allergies were                         reviewed. The patient's tolerance of previous                         anesthesia was also reviewed. The risks and benefits                         of the procedure and the sedation options and risks                         were discussed with the patient. All questions were                         answered, and informed consent was obtained. Prior                         Anticoagulants: The patient has taken anticoagulant                         medication. ASA Grade Assessment: II - A patient with                         mild systemic disease. After reviewing the risks and                         benefits, the patient was deemed in satisfactory                         condition to undergo the procedure.                        After obtaining informed consent, the endoscope was                         passed under direct vision. Throughout the procedure,                         the patient's blood pressure, pulse, and oxygen  saturations were monitored continuously. The                         Endosonoscope was introduced through the mouth, and                         advanced to the second part of duodenum. The upper GI                          endoscopy was accomplished without difficulty. The                         patient tolerated the procedure well. Findings:      The Z-line was irregular and was found at the gastroesophageal junction.      Grade I varices were found in the lower third of the esophagus.      Mild portal hypertensive gastropathy was found in the entire examined       stomach.      The examined duodenum was normal. Impression:            - Z-line irregular, at the gastroesophageal junction.                        - Grade I esophageal varices.                        - Portal hypertensive gastropathy.                        - Normal examined duodenum.                        - No specimens collected. Recommendation:        - Return patient to hospital ward for ongoing care.                        - Resume regular diet.                        - Continue present medications.                        - No sign of any old or new bleeding seen.                        Likely from nose bleeding.                        Patient should follow up with GI for liver disease. Procedure Code(s):     --- Professional ---                        [phone], Esophagogastroduodenoscopy, flexible,                         transoral; diagnostic, including collection of                         specimen(s) by brushing or washing, when performed                         (  separate procedure) Diagnosis Code(s):     --- Professional ---                        K92.0, Hematemesis                        K31.89, Other diseases of stomach and duodenum                        I85.00, Esophageal varices without bleeding CPT copyright 2022 American [employer] Association. All rights reserved. The codes documented in this report are preliminary and upon coder review may  be revised to meet current compliance requirements. Midge Minium MD, MD 08/22/2023 2:14:07 PM This report has been signed electronically. Number of Addenda: 0 Note  Initiated On: 08/22/2023 1:56 PM Estimated Blood Loss:  Estimated blood loss: none.      [facility]

## 2023-08-22 NOTE — Progress Notes (Addendum)
 Progress Note   Patient: Edward Powell ZOX:[id number] DOB: 06/06/1971 DOA: 08/21/2023     0 DOS: the patient was seen and examined on 08/22/2023   Brief hospital course: 53yo with h/o CAD s/p CABG, chronic HFrEF, HTN, and ETOH use d/o who presented with SOB and hematemesis.  Given phenobarbital for ETOH withdrawal, IV Protonix.  GI consulted, likely EGD today.  Assessment and Plan:  Hematemesis Vomiting possibly related to alcoholic gastritis, varices Continue IV Protonix Serial CBC, transfuse for Hgb <7 or ongoing bleeding GI consulted Pending EGD   Alcohol withdrawal  Alcohol use disorder Elevated LFTs suspect alcoholic liver disease Patient with tremors, abnormal LFTs uptrending for the past 2 months Acute hepatitis panel negative RUQ ultrasound with incidental cholelithiasis, no apparent cirrhosis Fatty liver on CTA Phenobarbital started in the ED, will change to Ativan per CIWA protocol   CAD S/P CABG Has occasional chest pain EKG nonacute Troponin negative Continue Coreg   Recurrent epistaxis Will need ENT follow-up Hematemesis may actually be from posterior nasal bleeding, but need to rule out GI cause first   HFrEF (heart failure with reduced ejection fraction) (HCC) Ischemic cardiomyopathy (recovered EF 55 to 60% 05/2023) Clinically compensated Continue carvedilol   Chronic dyspnea Uncertain etiology, similar to past presentation ruled out for PE CHF exacerbation not suspected as patient clinically euvolemic, normal BNP, chest x-ray nonacute CTA chest negative Likely related to ETOH, possibly chronic nosebleeds   Essential hypertension Continue Coreg  Class 1 Obesity Body mass index is 30.11 kg/m.Marland Kitchen  Weight loss should be encouraged Outpatient PCP/bariatric medicine f/u encouraged     Consultants: GI  Procedures: EGD  Antibiotics: None     Subjective: Patient reports chronic drinking of 6-10 mini-bottles of vodka most days.  Recently (a  few weeks), has been drinking 10-20.  Also with 20 pounds unintentional weight loss over 1-2 months.  Physical Exam: Vitals:   08/21/23 2200 08/22/23 0200 08/22/23 0500 08/22/23 0544  BP: (!) 171/107 (!) 176/102 (!) 165/96   Pulse: 66 (!) 58 (!) 59   Resp: 12 16 14    Temp: 98.4 F (36.9 C)   98.2 F (36.8 C)  TempSrc: Oral   Oral  SpO2: 97% 97% 92%   Weight:      Height:         No intake or output data in the 24 hours ending 08/22/23 0751 Filed Weights   08/21/23 1254  Weight: 89.8 kg    Exam:  General:  Appears calm and comfortable and is in NAD Eyes:  EOMI, normal lids, iris ENT:  grossly normal hearing, lips & tongue, mmm Cardiovascular:  RRR, no m/r/g. No LE edema.  Respiratory:   CTA bilaterally with no wheezes/rales/rhonchi.  Normal respiratory effort. Abdomen:  soft, NT, ND Skin:  no rash or induration seen on limited exam Musculoskeletal:  grossly normal tone BUE/BLE, good ROM, no bony abnormality Psychiatric:  blunted mood and affect, speech fluent and appropriate, AOx3 Neurologic:  CN 2-12 grossly intact, moves all extremities in coordinated fashion  Data Reviewed: I have reviewed the patient's lab results since admission.  Pertinent labs for today include:  K+ 3.4 AST 253/ALP 99/Bili 3.0, improved     Family Communication: Wife was present throughout evaluation  Disposition: Status is: Observation The patient remains OBS appropriate and will d/c before 2 midnights.  Planned Discharge Destination: Home    Time spent: 50 minutes  Author: Jonah Blue, MD 08/22/2023 7:49 AM  For on call review [email or web address].

## 2023-08-22 NOTE — Anesthesia Postprocedure Evaluation (Signed)
 Anesthesia Post Note  Patient: Edward Powell  Procedure(s) Performed: ESOPHAGOGASTRODUODENOSCOPY (EGD)  Patient location during evaluation: PACU Anesthesia Type: General Level of consciousness: awake and alert Pain management: pain level controlled Vital Signs Assessment: post-procedure vital signs reviewed and stable Respiratory status: spontaneous breathing, nonlabored ventilation, respiratory function stable and patient connected to nasal cannula oxygen Cardiovascular status: blood pressure returned to baseline and stable Postop Assessment: no apparent nausea or vomiting Anesthetic complications: no   No notable events documented.   Last Vitals:  Vitals:   08/22/23 1429 08/22/23 1512  BP: 128/79 (!) 153/96  Pulse: (!) 59 62  Resp: 16 16  Temp:  36.6 C  SpO2: 95% 99%    Last Pain:  Vitals:   08/22/23 1512  TempSrc: Oral  PainSc: 0-No pain                 Corinda Gubler

## 2023-08-22 NOTE — ED Notes (Signed)
 Patient c/o nausea. PRN meds to follow.

## 2023-08-22 NOTE — ED Notes (Signed)
Pt resting comfortably in bed at this time. Pt is alert and oriented with even and regular respirations. No acute distress noted. Pt denies any needs at this time. Call light within reach. Family remains at bedside. °

## 2023-08-22 NOTE — Anesthesia Preprocedure Evaluation (Addendum)
 Anesthesia Evaluation  Patient identified by MRN, date of birth, ID band Patient awake    Reviewed: Allergy & Precautions, NPO status , Patient's Chart, lab work & pertinent test results  History of Anesthesia Complications Negative for: history of anesthetic complications  Airway Mallampati: III   Neck ROM: Full    Dental  (+) Chipped   Pulmonary former smoker (quit 2019)   Pulmonary exam normal breath sounds clear to auscultation       Cardiovascular hypertension, pulmonary hypertension+ CAD (s/p MI and CABG 2019 on Plavix) and +CHF (ICM, EF 55-60%)  Normal cardiovascular exam Rhythm:Regular Rate:Normal  ECG 08/21/23:  Normal sinus rhythm Nonspecific ST abnormality Abnormal ECG When compared with ECG of 22-May-2023 14:00, No significant change was found   Neuro/Psych Alcohol use disorder, approx 8 shots daily    GI/Hepatic negative GI ROS,,,  Endo/Other  Hypothyroidism  Obesity   Renal/GU negative Renal ROS     Musculoskeletal   Abdominal   Peds  Hematology negative hematology ROS (+)   Anesthesia Other Findings   Reproductive/Obstetrics                             Anesthesia Physical Anesthesia Plan  ASA: 3  Anesthesia Plan: General   Post-op Pain Management:    Induction: Intravenous  PONV Risk Score and Plan: 2 and Propofol infusion, TIVA and Treatment may vary due to age or medical condition  Airway Management Planned: Natural Airway  Additional Equipment:   Intra-op Plan:   Post-operative Plan:   Informed Consent: I have reviewed the patients History and Physical, chart, labs and discussed the procedure including the risks, benefits and alternatives for the proposed anesthesia with the patient or authorized representative who has indicated his/her understanding and acceptance.       Plan Discussed with: CRNA  Anesthesia Plan Comments: (LMA/GETA backup  discussed.  Patient consented for risks of anesthesia including but not limited to:  - adverse reactions to medications - damage to eyes, teeth, lips or other oral mucosa - nerve damage due to positioning  - sore throat or hoarseness - damage to heart, brain, nerves, lungs, other parts of body or loss of life  Informed patient about role of CRNA in peri- and intra-operative care.  Patient voiced understanding.)        Anesthesia Quick Evaluation

## 2023-08-23 DIAGNOSIS — K920 Hematemesis: Secondary | ICD-10-CM | POA: Diagnosis not present

## 2023-08-23 LAB — BASIC METABOLIC PANEL
Anion gap: 12 (ref 5–15)
BUN: 10 mg/dL (ref 6–20)
CO2: 23 mmol/L (ref 22–32)
Calcium: 8.4 mg/dL — ABNORMAL LOW (ref 8.9–10.3)
Chloride: 100 mmol/L (ref 98–111)
Creatinine, Ser: 0.82 mg/dL (ref 0.61–1.24)
GFR, Estimated: >60 mL/min (ref >60)
Glucose, Bld: 89 mg/dL (ref 70–99)
Potassium: 3.5 mmol/L (ref 3.5–5.1)
Sodium: 135 mmol/L (ref 135–145)

## 2023-08-23 LAB — TSH: TSH: 10.589 u[IU]/mL — ABNORMAL HIGH (ref 0.350–4.500)

## 2023-08-23 NOTE — Discharge Summary (Signed)
 Edward Powell [name] ZOX:[id number] DOB: 01/24/1971 DOA: 08/21/2023  PCP: Margarita Mail, DO  Admit date: 08/21/2023 Discharge date: 08/23/2023  Time spent: 35 minutes  Recommendations for Outpatient Follow-up:  Pcp f/u ENT f/u (referral placed) GI f/u (referral placed)  Ongoing work towards sobriety    Discharge Diagnoses:  Principal Problem:   Hematemesis Active Problems:   Alcohol withdrawal (HCC)   CAD S/P percutaneous coronary angioplasty   Alcohol use disorder   Epistaxis   Abnormal LFTs   Essential hypertension   Chronic dyspnea   HFrEF (heart failure with reduced ejection fraction) (HCC)   TIA (transient ischemic attack)   Alcohol abuse   Discharge Condition: stable  Diet recommendation: heart healthy low sodium  Filed Weights   08/21/23 1254  Weight: 89.8 kg    History of present illness:  From admission h and p Per Edward Powell is a 53 y.o. male with medical history significant for CAD s/p CABG, HFrEF with recovered EF 2/2 ischemic cardiomyopathy, a, hypertension, alcohol use disorder and heavy daily drinker, admitted  in December with  chest pain, shortness of breath and nosebleeds, who presents to the ED with a 4 to 5-day history of vomiting, recurrent nosebleeds and shortness of breath.  He states initially his vomitus was gastric contents but then he started vomiting large clots.  According to the wife at bedside who contributes to the history he has also been having a lot of nosebleeds he denies blood in the stool.  On the day of arrival he developed shakes.  His last drink was about 36 hours prior.   Of note, at his hospitalization in December his nosebleeds were attributed to his DAPT therapy for his CAD.  He also had a negative CTA chest.  Also seen by cardiology for shortness of breath, with echo and no further workup.  ENT, Dr. Willeen Cass outpatient follow-up was recommended however wife says stable unclear about dose instructions.   Hospital Course:   Patient presents with hematemesis and ongoing recurrent epistaxis. No anemia. Hospitalized for epistaxis in December, did not follow up with ENT as advised. Given liver disease endoscopy was performed to r/o variceal and other such bleeding. EGD showed non-bleeding grade 1 varices, no evidence of GI bleeding. Hematemesis likely secondary to epistaxis. We wil again refer patient to [facility]NT, and we have again provided him with contact information for that clinic. Was very clear with patient and his wife that ultimately it is his responsibility to get that visit scheduled. I advised he call their clinic today to request an appointment, and to utilize his PCP if he encounters any difficulty arranging ENT f/u. Patient also does not have a gastroenterologist for his alcohol liver disease. We have referred him to one. He has had no emesis here and is tolerating a diet. No signs or symptoms or alcohol withdrawal.   Procedures: EGD   Consultations: GI  Discharge Exam: Vitals:   08/23/23 0450 08/23/23 0823  BP: (!) 168/112 (!) 149/96  Pulse: 68 71  Resp: 16 18  Temp: 98.4 F (36.9 C) 98.6 F (37 C)  SpO2: 97% 98%    General: NAD Cardiovascular: RRR Respiratory: CTAB Abdomen: soft, non-tender  Discharge Instructions   Discharge Instructions     Ambulatory referral to ENT   Complete by: As directed    Ambulatory referral to Gastroenterology   Complete by: As directed    Liver disease, etoh   What is the reason for referral?: Other   Diet - low  sodium heart healthy   Complete by: As directed    Increase activity slowly   Complete by: As directed       Allergies as of 08/23/2023   No Known Allergies      Medication List     TAKE these medications    carvedilol 25 MG tablet Commonly known as: COREG Take 1 tablet (25 mg total) by mouth 2 (two) times daily with a meal.   clopidogrel 75 MG tablet Commonly known as: PLAVIX Take 1 tablet (75 mg total) by mouth daily.    levothyroxine 25 MCG tablet Commonly known as: SYNTHROID Take 1 tablet by mouth once daily   oxymetazoline 0.05 % nasal spray Commonly known as: AFRIN Place 2 sprays into both nostrils 2 (two) times daily.       No Known Allergies  Follow-up Information     Margarita Mail, DO Follow up.   Specialty: Internal Medicine Contact information: [address] [place] [address] [phone]         [facility] Follow up.   Contact information: [address] [place], [address] [place] [address] [phone]         Midge Minium, MD .   Specialty: Gastroenterology Contact information: [address] [place]  [place] [address] [phone]         Midge Minium, MD Follow up.   Specialty: Gastroenterology Why: Follow up with this gastroenterologist or another provider from his practice, for your liver disease Contact information: [address] Dorothyann Peng  [facility] [address] [phone]                  The results of significant diagnostics from this hospitalization (including imaging, microbiology, ancillary and laboratory) are listed below for reference.    Significant Diagnostic Studies: CT Angio Chest PE W/Cm &/Or Wo Cm Result Date: 08/21/2023 CLINICAL DATA:  Shortness of breath. EXAM: CT ANGIOGRAPHY CHEST WITH CONTRAST TECHNIQUE: Multidetector CT imaging of the chest was performed using the standard protocol during bolus administration of intravenous contrast. Multiplanar CT image reconstructions and MIPs were obtained to evaluate the vascular anatomy. RADIATION DOSE REDUCTION: This exam was performed according to the departmental dose-optimization program which includes automated exposure control, adjustment of the mA and/or kV according to patient size and/or use of iterative reconstruction technique. CONTRAST:  75mL OMNIPAQUE IOHEXOL 350 MG/ML SOLN COMPARISON:  May 22, 2023 FINDINGS: Cardiovascular: There  is moderate to marked severity calcification of the aortic arch, without evidence of aortic aneurysm. Satisfactory opacification of the pulmonary arteries to the segmental level. No evidence of pulmonary embolism. Normal heart size. A coronary artery stent is in place. No pericardial effusion. Mediastinum/Nodes: No enlarged mediastinal, hilar, or axillary lymph nodes. Thyroid gland, trachea, and esophagus demonstrate no significant findings. Lungs/Pleura: Lungs are clear. No pleural effusion or pneumothorax. Upper Abdomen: There is diffuse fatty infiltration of the liver parenchyma. Musculoskeletal: Multiple sternal wires are noted with chronic and postoperative changes seen involving the inferior aspect of the sternum. A stable associated 2.4 cm x 5.2 cm x 8.8 cm fat containing ventral hernia is noted. Review of the MIP images confirms the above findings. IMPRESSION: 1. No evidence of pulmonary embolism or other acute intrathoracic process. 2. Hepatic steatosis. 3. Aortic atherosclerosis. Aortic Atherosclerosis (ICD10-I70.0). Electronically Signed   By: Aram Candela M.D.   On: 08/21/2023 22:14   US Abdomen Limited RUQ (LIVER/GB) Result Date: 08/21/2023 CLINICAL DATA:  Elevated LFTs EXAM: ULTRASOUND ABDOMEN LIMITED RIGHT UPPER QUADRANT  COMPARISON:  None Available. FINDINGS: Gallbladder: Gallbladder is well distended with a few small gallstones. No wall thickening or pericholecystic fluid is noted. Negative sonographic Murphy's sign is elicited. Common bile duct: Not well visualized Liver: Increased in echogenicity consistent with fatty infiltration. No discrete mass is noted. Portal vein is patent on color Doppler imaging with normal direction of blood flow towards the liver. Other: None. IMPRESSION: Cholelithiasis without complicating factors. Fatty liver. Electronically Signed   By: Alcide Clever M.D.   On: 08/21/2023 21:55   DG Chest 2 View Result Date: 08/21/2023 CLINICAL DATA:  Chest pain and shortness  of breath EXAM: CHEST - 2 VIEW COMPARISON:  Chest radiograph 05/22/2023 FINDINGS: Cardiomegaly. Status post median sternotomy. No large area pulmonary consolidation. No pleural effusion or pneumothorax. Thoracic spine degenerative changes. IMPRESSION: Cardiomegaly. No acute cardiopulmonary process. Electronically Signed   By: Annia Belt M.D.   On: 08/21/2023 16:02    Microbiology: Recent Results (from the past 240 hours)  Resp panel by RT-PCR (RSV, Flu A&B, Covid) Anterior Nasal Swab     Status: None   Collection Time: 08/21/23  6:12 PM   Specimen: Anterior Nasal Swab  Result Value Ref Range Status   SARS Coronavirus 2 by RT PCR NEGATIVE NEGATIVE Final    Comment: (NOTE) SARS-CoV-2 target nucleic acids are NOT DETECTED.  The SARS-CoV-2 RNA is generally detectable in upper respiratory specimens during the acute phase of infection. The lowest concentration of SARS-CoV-2 viral copies this assay can detect is 138 copies/mL. A negative result does not preclude SARS-Cov-2 infection and should not be used as the sole basis for treatment or other patient management decisions. A negative result may occur with  improper specimen collection/handling, submission of specimen other than nasopharyngeal swab, presence of viral mutation(s) within the areas targeted by this assay, and inadequate number of viral copies(<138 copies/mL). A negative result must be combined with clinical observations, patient history, and epidemiological information. The expected result is Negative.  Fact Sheet for Patients:  BloggerCourse.com  Fact Sheet for Healthcare Providers:  SeriousBroker.it  This test is no t yet approved or cleared by the [place] FDA and  has been authorized for detection and/or diagnosis of SARS-CoV-2 by FDA under an Emergency Use Authorization (EUA). This EUA will remain  in effect (meaning this test can be used) for the duration of  the COVID-19 declaration under Section 564(b)(1) of the Act, 21 U.S.C.section 360bbb-3(b)(1), unless the authorization is terminated  or revoked sooner.       Influenza A by PCR NEGATIVE NEGATIVE Final   Influenza B by PCR NEGATIVE NEGATIVE Final    Comment: (NOTE) The Xpert Xpress SARS-CoV-2/FLU/RSV plus assay is intended as an aid in the diagnosis of influenza from Nasopharyngeal swab specimens and should not be used as a sole basis for treatment. Nasal washings and aspirates are unacceptable for Xpert Xpress SARS-CoV-2/FLU/RSV testing.  Fact Sheet for Patients: BloggerCourse.com  Fact Sheet for Healthcare Providers: SeriousBroker.it  This test is not yet approved or cleared by the [place] FDA and has been authorized for detection and/or diagnosis of SARS-CoV-2 by FDA under an Emergency Use Authorization (EUA). This EUA will remain in effect (meaning this test can be used) for the duration of the COVID-19 declaration under Section 564(b)(1) of the Act, 21 U.S.C. section 360bbb-3(b)(1), unless the authorization is terminated or revoked.     Resp Syncytial Virus by PCR NEGATIVE NEGATIVE Final    Comment: (NOTE) Fact Sheet for Patients: BloggerCourse.com  Fact  Sheet for Healthcare Providers: SeriousBroker.it  This test is not yet approved or cleared by the [place] and has been authorized for detection and/or diagnosis of SARS-CoV-2 by FDA under an Emergency Use Authorization (EUA). This EUA will remain in effect (meaning this test can be used) for the duration of the COVID-19 declaration under Section 564(b)(1) of the Act, 21 U.S.C. section 360bbb-3(b)(1), unless the authorization is terminated or revoked.  Performed at [facility], [address] [place], [place] [address]      Labs: Basic Metabolic Panel: Recent Labs  Lab 08/21/23 1258  08/22/23 0537 08/23/23 0610  NA 139 135 135  K 3.3* 3.4* 3.5  CL 100 100 100  CO2 25 26 23   GLUCOSE 132* 97 89  BUN 11 10 10   CREATININE 0.68 0.67 0.82  CALCIUM 9.1 8.5* 8.4*   Liver Function Tests: Recent Labs  Lab 08/21/23 1258 08/22/23 0537  AST 370* 253*  ALT 123* 99*  ALKPHOS 199* 175*  BILITOT 1.8* 3.0*  PROT 7.9 7.4  ALBUMIN 3.5 3.1*   Recent Labs  Lab 08/21/23 1258  LIPASE 51   Recent Labs  Lab 08/21/23 1812  AMMONIA 38*   CBC: Recent Labs  Lab 08/21/23 1258 08/22/23 0007 08/22/23 0537 08/22/23 1537  WBC 7.1  --   --  4.2  NEUTROABS  --   --   --  2.6  HGB 14.4 13.1 13.0 13.5  HCT 40.6  --   --  38.9*  MCV 89.4  --   --  90.9  PLT 72*  --   --  39*   Cardiac Enzymes: No results for input(s): "CKTOTAL", "CKMB", "CKMBINDEX", "TROPONINI" in the last 168 hours. BNP: BNP (last 3 results) Recent Labs    05/23/23 0552 08/21/23 1258  BNP 278.4* 62.5    ProBNP (last 3 results) No results for input(s): "PROBNP" in the last 8760 hours.  CBG: No results for input(s): "GLUCAP" in the last 168 hours.     Signed:  Silvano Bilis MD.  [facility] 08/23/2023, 10:01 AM

## 2023-08-23 NOTE — Plan of Care (Signed)
   Problem: Activity: Goal: Risk for activity intolerance will decrease Outcome: Progressing   Problem: Nutrition: Goal: Adequate nutrition will be maintained Outcome: Progressing   Problem: Coping: Goal: Level of anxiety will decrease Outcome: Progressing   Problem: Safety: Goal: Ability to remain free from injury will improve Outcome: Progressing

## 2023-08-23 NOTE — Plan of Care (Signed)

## 2023-08-25 ENCOUNTER — Other Ambulatory Visit: Payer: Self-pay | Admitting: Internal Medicine

## 2023-08-25 DIAGNOSIS — R7989 Other specified abnormal findings of blood chemistry: Secondary | ICD-10-CM

## 2023-08-25 NOTE — Telephone Encounter (Signed)
 Requested Prescriptions  Pending Prescriptions Disp Refills   levothyroxine (SYNTHROID) 25 MCG tablet [Pharmacy Med Name: Levothyroxine Sodium 25 MCG Oral Tablet] 90 tablet 0    Sig: Take 1 tablet by mouth once daily     Endocrinology:  Hypothyroid Agents Failed - 08/25/2023  3:40 PM      Failed - TSH in normal range and within 360 days    TSH  Date Value Ref Range Status  08/23/2023 10.589 (H) 0.350 - 4.500 uIU/mL Final    Comment:    Performed by a 3rd Generation assay with a functional sensitivity of <=0.01 uIU/mL. Performed at [facility], [address] [place], [place] [address]   04/25/2023 15.17 (H) 0.40 - 4.50 mIU/L Final         Passed - Valid encounter within last 12 months    Recent Outpatient Visits           4 months ago Essential hypertension   [place] [facility] Margarita Mail, DO   5 years ago Coronary artery disease involving native coronary artery of native heart with angina pectoris [facility])   [facility] Lada, Janit Bern, MD

## 2023-11-27 ENCOUNTER — Other Ambulatory Visit: Payer: Self-pay | Admitting: Internal Medicine

## 2023-11-27 DIAGNOSIS — R7989 Other specified abnormal findings of blood chemistry: Secondary | ICD-10-CM

## 2023-11-28 NOTE — Telephone Encounter (Signed)
 Last OV 04/24/24. OV needed for additional refills.  Requested Prescriptions  Pending Prescriptions Disp Refills   levothyroxine  (SYNTHROID ) 25 MCG tablet [Pharmacy Med Name: Levothyroxine  Sodium 25 MCG Oral Tablet] 90 tablet 0    Sig: Take 1 tablet by mouth once daily     Endocrinology:  Hypothyroid Agents Failed - 11/28/2023 10:56 AM      Failed - TSH in normal range and within 360 days    TSH  Date Value Ref Range Status  08/23/2023 10.589 (H) 0.350 - 4.500 uIU/mL Final    Comment:    Performed by a 3rd Generation assay with a functional sensitivity of <=0.01 uIU/mL. Performed at [facility], [address] [place], [place] [address]   04/25/2023 15.17 (H) 0.40 - 4.50 mIU/L Final         Failed - Valid encounter within last 12 months    Recent Outpatient Visits   None

## 2023-12-16 ENCOUNTER — Other Ambulatory Visit: Payer: Self-pay | Admitting: Physician Assistant

## 2023-12-18 NOTE — Telephone Encounter (Signed)
 Please contact pt for future appointment. Pt overdue follow up hasn't been seen since 09/2022. Pt needing refills must be seen yearly.

## 2023-12-18 NOTE — Telephone Encounter (Signed)
 LVM to schedule f/u appt

## 2023-12-26 NOTE — Telephone Encounter (Signed)
 Left voice mail

## 2024-01-01 ENCOUNTER — Encounter: Payer: Self-pay | Admitting: Cardiovascular Disease

## 2024-01-01 NOTE — Telephone Encounter (Signed)
 Called 3x, left vm. Unable to reach letter being sent via mail.

## 2024-01-16 ENCOUNTER — Other Ambulatory Visit: Payer: Self-pay | Admitting: Physician Assistant

## 2024-02-26 ENCOUNTER — Other Ambulatory Visit: Payer: Self-pay | Admitting: Internal Medicine

## 2024-02-26 DIAGNOSIS — R7989 Other specified abnormal findings of blood chemistry: Secondary | ICD-10-CM

## 2024-02-26 NOTE — Telephone Encounter (Unsigned)
 Copied from CRM [id number]. Topic: Clinical - Medication Refill >> Feb 26, 2024 10:32 AM Kevelyn M wrote: Medication: levothyroxine  (SYNTHROID ) 25 MCG tablet  Has the patient contacted their pharmacy? Yes (Agent: If no, request that the patient contact the pharmacy for the refill. If patient does not wish to contact the pharmacy document the reason why and proceed with request.) (Agent: If yes, when and what did the pharmacy advise?)  This is the patient's preferred pharmacy:  [facility] [address] [place] - [address] [place] [address] Phone: [phone] Fax: [phone]  Is this the correct pharmacy for this prescription? Yes If no, delete pharmacy and type the correct one.   Has the prescription been filled recently? No  Is the patient out of the medication? Yes  Has the patient been seen for an appointment in the last year OR does the patient have an upcoming appointment? Yes  Can we respond through MyChart? Yes  Agent: Please be advised that Rx refills may take up to 3 business days. We ask that you follow-up with your pharmacy.

## 2024-02-27 NOTE — Telephone Encounter (Signed)
 Requested medications are due for refill today.  yes  Requested medications are on the active medications list.  yes  Last refill. 11/28/2023 #90 0 rf  Future visit scheduled.   yes  Notes to clinic.  Abnormal labs.    Requested Prescriptions  Pending Prescriptions Disp Refills   levothyroxine  (SYNTHROID ) 25 MCG tablet 90 tablet 0    Sig: Take 1 tablet (25 mcg total) by mouth daily.     Endocrinology:  Hypothyroid Agents Failed - 02/27/2024  1:48 PM      Failed - TSH in normal range and within 360 days    TSH  Date Value Ref Range Status  08/23/2023 10.589 (H) 0.350 - 4.500 uIU/mL Final    Comment:    Performed by a 3rd Generation assay with a functional sensitivity of <=0.01 uIU/mL. Performed at [facility], [address] [place], [place] [address]   04/25/2023 15.17 (H) 0.40 - 4.50 mIU/L Final         Failed - Valid encounter within last 12 months    Recent Outpatient Visits   None

## 2024-03-05 ENCOUNTER — Other Ambulatory Visit: Payer: Self-pay | Admitting: Internal Medicine

## 2024-03-05 DIAGNOSIS — R7989 Other specified abnormal findings of blood chemistry: Secondary | ICD-10-CM

## 2024-03-06 NOTE — Telephone Encounter (Signed)
 Requested medication (s) are due for refill today:   Yes  Requested medication (s) are on the active medication list:   Yes  Future visit scheduled:   Yes 9/23 with Dr. Bernardo   LOV 04/25/2023   Last ordered: 11/28/2023 #90, 0 refills  Unable to refill because OV needed.  Attempts to call him and a letter has been sent for him to make an appt.   Provider to review.     Requested Prescriptions  Pending Prescriptions Disp Refills   levothyroxine  (SYNTHROID ) 25 MCG tablet [Pharmacy Med Name: Levothyroxine  Sodium 25 MCG Oral Tablet] 90 tablet 0    Sig: Take 1 tablet by mouth once daily     Endocrinology:  Hypothyroid Agents Failed - 03/06/2024  3:03 PM      Failed - TSH in normal range and within 360 days    TSH  Date Value Ref Range Status  08/23/2023 10.589 (H) 0.350 - 4.500 uIU/mL Final    Comment:    Performed by a 3rd Generation assay with a functional sensitivity of <=0.01 uIU/mL. Performed at [facility], [address] [place], [place] [address]   04/25/2023 15.17 (H) 0.40 - 4.50 mIU/L Final         Failed - Valid encounter within last 12 months    Recent Outpatient Visits   None

## 2024-03-12 ENCOUNTER — Encounter: Admitting: Internal Medicine

## 2024-03-12 NOTE — Progress Notes (Signed)
 Patient not seen.

## 2024-04-22 ENCOUNTER — Emergency Department

## 2024-04-22 ENCOUNTER — Other Ambulatory Visit: Payer: Self-pay | Admitting: Physician Assistant

## 2024-04-22 ENCOUNTER — Inpatient Hospital Stay
Admission: EM | Admit: 2024-04-22 | Discharge: 2024-04-25 | DRG: 897 | Disposition: A | Discharge status: Home / Self Care | Attending: Internal Medicine | Admitting: Internal Medicine

## 2024-04-22 DIAGNOSIS — I10 Essential (primary) hypertension: Secondary | ICD-10-CM | POA: Diagnosis not present

## 2024-04-22 DIAGNOSIS — Z8673 Personal history of transient ischemic attack (TIA), and cerebral infarction without residual deficits: Secondary | ICD-10-CM

## 2024-04-22 DIAGNOSIS — R0789 Other chest pain: Secondary | ICD-10-CM | POA: Diagnosis present

## 2024-04-22 DIAGNOSIS — Z79899 Other long term (current) drug therapy: Secondary | ICD-10-CM

## 2024-04-22 DIAGNOSIS — D696 Thrombocytopenia, unspecified: Secondary | ICD-10-CM | POA: Insufficient documentation

## 2024-04-22 DIAGNOSIS — R079 Chest pain, unspecified: Secondary | ICD-10-CM | POA: Diagnosis not present

## 2024-04-22 DIAGNOSIS — R200 Anesthesia of skin: Secondary | ICD-10-CM | POA: Diagnosis present

## 2024-04-22 DIAGNOSIS — K429 Umbilical hernia without obstruction or gangrene: Secondary | ICD-10-CM | POA: Diagnosis not present

## 2024-04-22 DIAGNOSIS — I2510 Atherosclerotic heart disease of native coronary artery without angina pectoris: Secondary | ICD-10-CM | POA: Diagnosis not present

## 2024-04-22 DIAGNOSIS — J9811 Atelectasis: Secondary | ICD-10-CM | POA: Diagnosis not present

## 2024-04-22 DIAGNOSIS — Z8679 Personal history of other diseases of the circulatory system: Secondary | ICD-10-CM

## 2024-04-22 DIAGNOSIS — K439 Ventral hernia without obstruction or gangrene: Secondary | ICD-10-CM | POA: Diagnosis not present

## 2024-04-22 DIAGNOSIS — R0902 Hypoxemia: Secondary | ICD-10-CM | POA: Diagnosis not present

## 2024-04-22 DIAGNOSIS — E039 Hypothyroidism, unspecified: Secondary | ICD-10-CM | POA: Diagnosis not present

## 2024-04-22 DIAGNOSIS — I255 Ischemic cardiomyopathy: Secondary | ICD-10-CM | POA: Diagnosis not present

## 2024-04-22 DIAGNOSIS — Z7902 Long term (current) use of antithrombotics/antiplatelets: Secondary | ICD-10-CM | POA: Diagnosis not present

## 2024-04-22 DIAGNOSIS — M940 Chondrocostal junction syndrome [Tietze]: Secondary | ICD-10-CM

## 2024-04-22 DIAGNOSIS — Z820 Family history of epilepsy and other diseases of the nervous system: Secondary | ICD-10-CM

## 2024-04-22 DIAGNOSIS — Z800 Family history of malignant neoplasm of digestive organs: Secondary | ICD-10-CM

## 2024-04-22 DIAGNOSIS — R7989 Other specified abnormal findings of blood chemistry: Secondary | ICD-10-CM | POA: Diagnosis not present

## 2024-04-22 DIAGNOSIS — E876 Hypokalemia: Secondary | ICD-10-CM | POA: Diagnosis not present

## 2024-04-22 DIAGNOSIS — I110 Hypertensive heart disease with heart failure: Secondary | ICD-10-CM | POA: Diagnosis not present

## 2024-04-22 DIAGNOSIS — E781 Pure hyperglyceridemia: Secondary | ICD-10-CM | POA: Diagnosis present

## 2024-04-22 DIAGNOSIS — Z79890 Hormone replacement therapy (postmenopausal): Secondary | ICD-10-CM | POA: Diagnosis not present

## 2024-04-22 DIAGNOSIS — D6959 Other secondary thrombocytopenia: Secondary | ICD-10-CM | POA: Diagnosis present

## 2024-04-22 DIAGNOSIS — Z951 Presence of aortocoronary bypass graft: Secondary | ICD-10-CM

## 2024-04-22 DIAGNOSIS — Z6829 Body mass index (BMI) 29.0-29.9, adult: Secondary | ICD-10-CM | POA: Diagnosis not present

## 2024-04-22 DIAGNOSIS — Z803 Family history of malignant neoplasm of breast: Secondary | ICD-10-CM

## 2024-04-22 DIAGNOSIS — F10931 Alcohol use, unspecified with withdrawal delirium: Secondary | ICD-10-CM | POA: Diagnosis not present

## 2024-04-22 DIAGNOSIS — K766 Portal hypertension: Secondary | ICD-10-CM | POA: Diagnosis present

## 2024-04-22 DIAGNOSIS — Z8249 Family history of ischemic heart disease and other diseases of the circulatory system: Secondary | ICD-10-CM | POA: Diagnosis not present

## 2024-04-22 DIAGNOSIS — E663 Overweight: Secondary | ICD-10-CM | POA: Diagnosis not present

## 2024-04-22 DIAGNOSIS — F10239 Alcohol dependence with withdrawal, unspecified: Secondary | ICD-10-CM | POA: Diagnosis not present

## 2024-04-22 DIAGNOSIS — I5022 Chronic systolic (congestive) heart failure: Secondary | ICD-10-CM | POA: Diagnosis not present

## 2024-04-22 DIAGNOSIS — F10231 Alcohol dependence with withdrawal delirium: Principal | ICD-10-CM | POA: Diagnosis present

## 2024-04-22 DIAGNOSIS — G9349 Other encephalopathy: Secondary | ICD-10-CM | POA: Diagnosis present

## 2024-04-22 DIAGNOSIS — Z955 Presence of coronary angioplasty implant and graft: Secondary | ICD-10-CM

## 2024-04-22 DIAGNOSIS — I252 Old myocardial infarction: Secondary | ICD-10-CM

## 2024-04-22 DIAGNOSIS — Z9109 Other allergy status, other than to drugs and biological substances: Secondary | ICD-10-CM

## 2024-04-22 DIAGNOSIS — Z801 Family history of malignant neoplasm of trachea, bronchus and lung: Secondary | ICD-10-CM

## 2024-04-22 DIAGNOSIS — R519 Headache, unspecified: Secondary | ICD-10-CM

## 2024-04-22 DIAGNOSIS — Z888 Allergy status to other drugs, medicaments and biological substances status: Secondary | ICD-10-CM

## 2024-04-22 DIAGNOSIS — Z87891 Personal history of nicotine dependence: Secondary | ICD-10-CM

## 2024-04-22 DIAGNOSIS — Z0389 Encounter for observation for other suspected diseases and conditions ruled out: Secondary | ICD-10-CM | POA: Diagnosis not present

## 2024-04-22 DIAGNOSIS — Z823 Family history of stroke: Secondary | ICD-10-CM

## 2024-04-22 DIAGNOSIS — I272 Pulmonary hypertension, unspecified: Secondary | ICD-10-CM | POA: Diagnosis present

## 2024-04-22 DIAGNOSIS — K760 Fatty (change of) liver, not elsewhere classified: Secondary | ICD-10-CM | POA: Diagnosis not present

## 2024-04-22 DIAGNOSIS — I6523 Occlusion and stenosis of bilateral carotid arteries: Secondary | ICD-10-CM | POA: Diagnosis not present

## 2024-04-22 DIAGNOSIS — K8020 Calculus of gallbladder without cholecystitis without obstruction: Secondary | ICD-10-CM | POA: Diagnosis not present

## 2024-04-22 DIAGNOSIS — G441 Vascular headache, not elsewhere classified: Secondary | ICD-10-CM | POA: Diagnosis not present

## 2024-04-22 DIAGNOSIS — R0602 Shortness of breath: Secondary | ICD-10-CM | POA: Diagnosis present

## 2024-04-22 DIAGNOSIS — Z83438 Family history of other disorder of lipoprotein metabolism and other lipidemia: Secondary | ICD-10-CM

## 2024-04-22 DIAGNOSIS — I700 Atherosclerosis of aorta: Secondary | ICD-10-CM | POA: Diagnosis not present

## 2024-04-22 DIAGNOSIS — R202 Paresthesia of skin: Secondary | ICD-10-CM | POA: Diagnosis present

## 2024-04-22 DIAGNOSIS — G4489 Other headache syndrome: Secondary | ICD-10-CM | POA: Diagnosis not present

## 2024-04-22 HISTORY — DX: Umbilical hernia without obstruction or gangrene: K42.9

## 2024-04-22 HISTORY — DX: Ventral hernia without obstruction or gangrene: K43.9

## 2024-04-22 LAB — CBC
HCT: 41.8 % (ref 39.0–52.0)
Hemoglobin: 15.1 g/dL (ref 13.0–17.0)
MCH: 33 pg (ref 26.0–34.0)
MCHC: 36.1 g/dL — ABNORMAL HIGH (ref 30.0–36.0)
MCV: 91.5 fL (ref 80.0–100.0)
Platelets: 101 K/uL — ABNORMAL LOW (ref 150–400)
RBC: 4.57 MIL/uL (ref 4.22–5.81)
RDW: 13.6 % (ref 11.5–15.5)
WBC: 5.9 K/uL (ref 4.0–10.5)
nRBC: 0 % (ref 0.0–0.2)

## 2024-04-22 LAB — HEPATIC FUNCTION PANEL
ALT: 80 U/L — ABNORMAL HIGH (ref 0–44)
AST: 153 U/L — ABNORMAL HIGH (ref 15–41)
Albumin: 3.5 g/dL (ref 3.5–5.0)
Alkaline Phosphatase: 120 U/L (ref 38–126)
Bilirubin, Direct: 0.5 mg/dL — ABNORMAL HIGH (ref 0.0–0.2)
Indirect Bilirubin: 1.9 mg/dL — ABNORMAL HIGH (ref 0.3–0.9)
Total Bilirubin: 2.4 mg/dL — ABNORMAL HIGH (ref 0.0–1.2)
Total Protein: 8.4 g/dL — ABNORMAL HIGH (ref 6.5–8.1)

## 2024-04-22 LAB — BASIC METABOLIC PANEL WITH GFR
Anion gap: 17 — ABNORMAL HIGH (ref 5–15)
BUN: 9 mg/dL (ref 6–20)
CO2: 21 mmol/L — ABNORMAL LOW (ref 22–32)
Calcium: 8.6 mg/dL — ABNORMAL LOW (ref 8.9–10.3)
Chloride: 99 mmol/L (ref 98–111)
Creatinine, Ser: 0.64 mg/dL (ref 0.61–1.24)
GFR, Estimated: >60 mL/min (ref >60)
Glucose, Bld: 129 mg/dL — ABNORMAL HIGH (ref 70–99)
Potassium: 3.1 mmol/L — ABNORMAL LOW (ref 3.5–5.1)
Sodium: 137 mmol/L (ref 135–145)

## 2024-04-22 LAB — LIPASE, BLOOD: Lipase: 50 U/L (ref 11–51)

## 2024-04-22 LAB — TROPONIN I (HIGH SENSITIVITY)
Troponin I (High Sensitivity): 15 ng/L (ref <18)
Troponin I (High Sensitivity): 10 ng/L (ref <18)

## 2024-04-22 LAB — CBG MONITORING, ED: Glucose-Capillary: 173 mg/dL — ABNORMAL HIGH (ref 70–99)

## 2024-04-22 MED ORDER — ONDANSETRON HCL 4 MG/2ML IJ SOLN
4.0000 mg | INTRAMUSCULAR | Status: AC
  Administered 2024-04-22: 4 mg via INTRAVENOUS
  Filled 2024-04-22: qty 2

## 2024-04-22 MED ORDER — HYDROCODONE-ACETAMINOPHEN 5-325 MG PO TABS
1.0000 | ORAL_TABLET | ORAL | Status: DC | PRN
  Administered 2024-04-23 – 2024-04-25 (×4): 2 via ORAL
  Filled 2024-04-22 (×4): qty 2

## 2024-04-22 MED ORDER — LORAZEPAM 1 MG PO TABS: 0.0000 mg | ORAL_TABLET | Freq: Three times a day (TID) | ORAL | Status: DC

## 2024-04-22 MED ORDER — LORAZEPAM 2 MG/ML IJ SOLN: 1.0000 mg | INTRAMUSCULAR | Status: DC | PRN

## 2024-04-22 MED ORDER — MORPHINE SULFATE (PF) 2 MG/ML IV SOLN: 1.0000 mg | Freq: Four times a day (QID) | INTRAVENOUS | Status: DC | PRN

## 2024-04-22 MED ORDER — LEVOTHYROXINE SODIUM 25 MCG PO TABS
25.0000 ug | ORAL_TABLET | Freq: Every day | ORAL | Status: DC
  Administered 2024-04-23 – 2024-04-25 (×3): 25 ug via ORAL
  Filled 2024-04-22 (×3): qty 1

## 2024-04-22 MED ORDER — FOLIC ACID 1 MG PO TABS
1.0000 mg | ORAL_TABLET | Freq: Every day | ORAL | Status: DC
  Administered 2024-04-22 – 2024-04-25 (×4): 1 mg via ORAL
  Filled 2024-04-22 (×4): qty 1

## 2024-04-22 MED ORDER — CLOPIDOGREL BISULFATE 75 MG PO TABS
75.0000 mg | ORAL_TABLET | Freq: Every day | ORAL | Status: DC
  Administered 2024-04-23 – 2024-04-25 (×3): 75 mg via ORAL
  Filled 2024-04-22 (×3): qty 1

## 2024-04-22 MED ORDER — LORAZEPAM 1 MG PO TABS: 1.0000 mg | ORAL_TABLET | ORAL | Status: DC | PRN

## 2024-04-22 MED ORDER — ADULT MULTIVITAMIN W/MINERALS CH: 1.0000 | ORAL_TABLET | Freq: Every day | ORAL | Status: DC

## 2024-04-22 MED ORDER — ACETAMINOPHEN 325 MG PO TABS: 650.0000 mg | ORAL_TABLET | Freq: Four times a day (QID) | ORAL | Status: DC | PRN

## 2024-04-22 MED ORDER — ACETAMINOPHEN 650 MG RE SUPP: 650.0000 mg | Freq: Four times a day (QID) | RECTAL | Status: DC | PRN

## 2024-04-22 MED ORDER — LORAZEPAM 2 MG/ML IJ SOLN
1.0000 mg | INTRAMUSCULAR | Status: DC | PRN
  Administered 2024-04-23: 1 mg via INTRAVENOUS
  Administered 2024-04-23: 2 mg via INTRAVENOUS
  Filled 2024-04-22 (×2): qty 1

## 2024-04-22 MED ORDER — ONDANSETRON HCL 4 MG/2ML IJ SOLN
4.0000 mg | Freq: Four times a day (QID) | INTRAMUSCULAR | Status: DC | PRN
  Administered 2024-04-23: 4 mg via INTRAVENOUS
  Filled 2024-04-22: qty 2

## 2024-04-22 MED ORDER — SODIUM CHLORIDE 0.9 % IV SOLN: INTRAVENOUS | Status: DC

## 2024-04-22 MED ORDER — THIAMINE HCL 100 MG/ML IJ SOLN: 100.0000 mg | Freq: Every day | INTRAMUSCULAR | Status: DC

## 2024-04-22 MED ORDER — SENNOSIDES-DOCUSATE SODIUM 8.6-50 MG PO TABS
1.0000 | ORAL_TABLET | Freq: Every evening | ORAL | Status: DC | PRN
Start: 2024-04-22 — End: 2024-04-25

## 2024-04-22 MED ORDER — LORAZEPAM 1 MG PO TABS
0.0000 mg | ORAL_TABLET | ORAL | Status: AC
  Administered 2024-04-23 – 2024-04-24 (×3): 1 mg via ORAL
  Filled 2024-04-22 (×3): qty 1

## 2024-04-22 MED ORDER — IOHEXOL 350 MG/ML SOLN
100.0000 mL | Freq: Once | INTRAVENOUS | Status: AC | PRN
  Administered 2024-04-22: 100 mL via INTRAVENOUS

## 2024-04-22 MED ORDER — HYDRALAZINE HCL 20 MG/ML IJ SOLN
5.0000 mg | Freq: Four times a day (QID) | INTRAMUSCULAR | Status: DC | PRN
  Administered 2024-04-23: 5 mg via INTRAVENOUS
  Filled 2024-04-22: qty 1

## 2024-04-22 MED ORDER — SODIUM CHLORIDE 0.9% FLUSH
3.0000 mL | Freq: Two times a day (BID) | INTRAVENOUS | Status: DC
  Administered 2024-04-22 – 2024-04-25 (×6): 3 mL via INTRAVENOUS

## 2024-04-22 MED ORDER — ADULT MULTIVITAMIN W/MINERALS CH
1.0000 | ORAL_TABLET | Freq: Every day | ORAL | Status: DC
  Administered 2024-04-22 – 2024-04-25 (×4): 1 via ORAL
  Filled 2024-04-22 (×4): qty 1

## 2024-04-22 MED ORDER — LORAZEPAM 2 MG/ML IJ SOLN
3.0000 mg | Freq: Once | INTRAMUSCULAR | Status: AC
  Administered 2024-04-22: 3 mg via INTRAVENOUS
  Filled 2024-04-22: qty 2

## 2024-04-22 MED ORDER — FOLIC ACID 1 MG PO TABS: 1.0000 mg | ORAL_TABLET | Freq: Every day | ORAL | Status: DC

## 2024-04-22 MED ORDER — THIAMINE MONONITRATE 100 MG PO TABS
100.0000 mg | ORAL_TABLET | Freq: Every day | ORAL | Status: DC
  Administered 2024-04-23 – 2024-04-25 (×3): 100 mg via ORAL
  Filled 2024-04-22 (×3): qty 1

## 2024-04-22 MED ORDER — BISACODYL 5 MG PO TBEC: 5.0000 mg | DELAYED_RELEASE_TABLET | Freq: Every day | ORAL | Status: DC | PRN

## 2024-04-22 MED ORDER — ZOLPIDEM TARTRATE 5 MG PO TABS: 5.0000 mg | ORAL_TABLET | Freq: Every evening | ORAL | Status: DC | PRN

## 2024-04-22 MED ORDER — LEVOTHYROXINE SODIUM 25 MCG PO TABS: 25.0000 ug | ORAL_TABLET | Freq: Every day | ORAL | Status: DC

## 2024-04-22 MED ORDER — ONDANSETRON HCL 4 MG PO TABS: 4.0000 mg | ORAL_TABLET | Freq: Four times a day (QID) | ORAL | Status: DC | PRN

## 2024-04-22 MED ORDER — CARVEDILOL 25 MG PO TABS
25.0000 mg | ORAL_TABLET | Freq: Two times a day (BID) | ORAL | Status: DC
  Administered 2024-04-23 – 2024-04-25 (×5): 25 mg via ORAL
  Filled 2024-04-22 (×5): qty 1

## 2024-04-22 MED ORDER — POTASSIUM CHLORIDE CRYS ER 20 MEQ PO TBCR
30.0000 meq | EXTENDED_RELEASE_TABLET | Freq: Two times a day (BID) | ORAL | Status: AC
  Administered 2024-04-22 – 2024-04-23 (×2): 30 meq via ORAL
  Filled 2024-04-22 (×2): qty 2

## 2024-04-22 MED ORDER — THIAMINE MONONITRATE 100 MG PO TABS
100.0000 mg | ORAL_TABLET | Freq: Every day | ORAL | Status: DC
  Administered 2024-04-22: 100 mg via ORAL
  Filled 2024-04-22: qty 1

## 2024-04-22 MED ORDER — HYDROMORPHONE HCL 1 MG/ML IJ SOLN
1.0000 mg | INTRAMUSCULAR | Status: AC
  Administered 2024-04-22: 1 mg via INTRAVENOUS
  Filled 2024-04-22: qty 1

## 2024-04-22 NOTE — ED Triage Notes (Signed)
 Pt presents to the ED via POV from home with wife. Pt reports 8/10 left sided chest pain that started this morning. Pt reports SHOB. 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

## 2024-04-22 NOTE — H&P (Signed)
 History and Physical   [facility] - [place] @ [facility] Admission History and Physical [employer], D.O.    Patient Name: Edward Powell MR#: [id number] Date of Birth: 03-27-1971 Date of Admission: 04/22/2024  Referring MD/NP/PA: Dr. Oneil Budge Primary Care Physician: Bernardo Fend, DO  Chief Complaint:  Chief Complaint  Patient presents with   Chest Pain  Please note the entire history is obtained from the patient's emergency department chart, emergency department provider and the patient's family who is at the bedside. Patient's personal history is limited by somnolence, sedation secondary to medication   HPI: Edward Powell is a 53 y.o. male with a known history of EtOH, esophageal varices, CAD status post CABG, HTN, HLD, ischemic cardiomyopathy, pulm HTN, hypothyroidism, TIA presents to the emergency department for evaluation of chest pain.  He reports the onset of sharp chest pain on the left side of his sternum with tenderness.  He reports a fullness and swelling along with exquisite tenderness to palpation.  His wife at bedside who provides the majority of the history states that he has not had any injuries, exercise or other inciting events that would have caused pain.  With respect to his alcohol use his wife states that he binge drinks for several days in a row and then will go several weeks without drinking any at all.  He has been drinking significant amounts of alcohol in the last week or so.  Last drink was yesterday.  He has detoxed on his own and in the hospital.  In the emergency department the patient became diaphoretic and hypertensive with symptoms of confusion, consistent with acute alcohol withdrawal.  He he received Ativan  multivitamin folic acid  and thiamine  as well as Dilaudid  and Zofran   Medical admission has been requested for further management of reproducible chest wall pain as well as acute alcohol withdrawal syndrome.  Review of Systems:  Unable  to obtain review of systems secondary to somnolence   Past Medical History:  Diagnosis Date   Alcohol abuse    CAD (coronary artery disease)    a. 12/2017 NSTEMI/Cath: Severe 3VD; b. 12/2017 CABG x 4: LIMA->LAD, VG->RI->LCX, VG->RPDA; c. 05/2018 PCI: LM 50d, LAD 50ost, 80p/m, RI nl, LCX 100p, OM2 fills via collats from dLAD, RCA 70p (2.75 x 18 Resolute Onyx DES), 65m, 73m, 60d, VG->OM2 100, LIMA->LAD ok, VG->RPDA 100ost.   Essential hypertension    Hyperlipidemia LDL goal <70    Hypertriglyceridemia    Ischemic cardiomyopathy    a. 05/2018 LV Gram: EF 35-45%; b. 08/2018 Echo: EF 55-60%, nl RV fxn, RVSP 41.22mmHg. Grossly nl MV/TV/AV.   Pulmonary hypertension (HCC)    a. 12/2017 Echo: EF 55-60%; b. 08/2018 Echo: EF 55-60%, RVSP 41.6 mmHg.   Tobacco abuse     Past Surgical History:  Procedure Laterality Date   CORONARY ARTERY BYPASS GRAFT N/A 01/10/2018   Procedure: CORONARY ARTERY BYPASS GRAFTING (CABG);  Surgeon: Army Dallas NOVAK, MD;  Location: [facility] OR;  Service: Open Heart Surgery;  Laterality: N/A;  Times 4 using left internal mammary artery to the LAD and endoscopically harvested left saphenous vein to OM1, OM2, and PLB   CORONARY STENT INTERVENTION N/A 06/14/2018   Procedure: CORONARY STENT INTERVENTION;  Surgeon: Darron Deatrice LABOR, MD;  Location: [facility] INVASIVE CV LAB;  Service: Cardiovascular;  Laterality: N/A;   ESOPHAGOGASTRODUODENOSCOPY N/A 08/22/2023   Procedure: ESOPHAGOGASTRODUODENOSCOPY (EGD);  Surgeon: Jinny Carmine, MD;  Location: [facility] ENDOSCOPY;  Service: Endoscopy;  Laterality: N/A;   LEFT HEART CATH AND  CORONARY ANGIOGRAPHY N/A 01/09/2018   Procedure: LEFT HEART CATH AND CORONARY ANGIOGRAPHY;  Surgeon: Mady Bruckner, MD;  Location: [facility] INVASIVE CV LAB;  Service: Cardiovascular;  Laterality: N/A;   LEFT HEART CATH AND CORONARY ANGIOGRAPHY N/A 05/31/2018   Procedure: LEFT HEART CATH AND CORONARY ANGIOGRAPHY;  Surgeon: Darron Deatrice LABOR, MD;  Location: [facility] INVASIVE CV LAB;   Service: Cardiovascular;  Laterality: N/A;   NO PAST SURGERIES     TEE WITHOUT CARDIOVERSION N/A 01/10/2018   Procedure: TRANSESOPHAGEAL ECHOCARDIOGRAM (TEE);  Surgeon: Army Dallas NOVAK, MD;  Location: [facility] OR;  Service: Open Heart Surgery;  Laterality: N/A;     reports that he has quit smoking. His smoking use included cigarettes. He has a 30 pack-year smoking history. He has never used smokeless tobacco. He reports current alcohol use of about 18.0 standard drinks of alcohol per week. He reports that he does not currently use drugs.  No Known Allergies  Family History  Problem Relation Age of Onset   Hypertension Mother    Breast cancer Mother    Hyperlipidemia Mother    Liver cancer Father    Lung cancer Father    Hypertension Father    Atrial fibrillation Father    Stroke Brother    Hypertension Brother    Hyperlipidemia Brother    Heart attack Maternal Grandmother    Stroke Maternal Grandmother    Hyperlipidemia Maternal Grandmother    Hypertension Maternal Grandmother    Stroke Paternal Grandmother    Alzheimer's disease Paternal Grandmother    Hypertension Paternal Grandmother     Prior to Admission medications   Medication Sig Start Date End Date Taking? Authorizing Provider  carvedilol  (COREG ) 25 MG tablet Take 1 tablet (25 mg total) by mouth 2 (two) times daily with a meal. PLEASE MAKE AN APPOINTMENT IN ORDER TO RECEIVE ADDITIONAL REFILLS, FIRST ATTEMPT. 04/22/24   Darron Deatrice LABOR, MD  clopidogrel  (PLAVIX ) 75 MG tablet TAKE 1 TABLET BY MOUTH ONCE DAILY *PLEASE CONTACT OFFICE FOR FUTURE APPOINTMENT AND REFILLS* 01/19/24   Dunn, Dayna N, PA-C  levothyroxine  (SYNTHROID ) 25 MCG tablet Take 1 tablet by mouth once daily 11/28/23   Bernardo Fend, DO  oxymetazoline  (AFRIN) 0.05 % nasal spray Place 2 sprays into both nostrils 2 (two) times daily. 05/24/23 05/23/24  Kandis Devaughn Sayres, MD    Physical Exam: Vitals:   04/22/24 1910 04/22/24 1911 04/22/24 1958 04/22/24 2000   BP:    (!) 141/91  Pulse:    62  Resp:    11  Temp:      TempSrc:      SpO2: (!) 89% 94% 94% 93%  Weight:      Height:        GENERAL: 53 y.o.-year-old white male patient, well-developed, well-nourished lying in the bed in no acute distress.  Pleasant and cooperative.  Somnolent HEENT: Head atraumatic, normocephalic. Pupils equal. Mucus membranes moist. NECK: Supple. No JVD. CHEST: Normal breath sounds bilaterally. No wheezing, rales, rhonchi or crackles. No use of accessory muscles of respiration.  Exquisite tenderness palpation along the left sternal border.  Minimal tenderness to palpation along the right sternal border.  There is a midline median sternotomy scar CARDIOVASCULAR: S1, S2 normal. No murmurs, rubs, or gallops. Cap refill <2 seconds. Pulses intact distally.  ABDOMEN: Soft, nondistended, nontender.  There is an umbilical hernia present that is reducible EXTREMITIES: No pedal edema, cyanosis, or clubbing. No calf tenderness or Homan's sign.  NEUROLOGIC: The patient is alert and oriented x  3.    Labs on Admission:  CBC: Recent Labs  Lab 04/22/24 1733  WBC 5.9  HGB 15.1  HCT 41.8  MCV 91.5  PLT 101*   Basic Metabolic Panel: Recent Labs  Lab 04/22/24 1733  NA 137  K 3.1*  CL 99  CO2 21*  GLUCOSE 129*  BUN 9  CREATININE 0.64  CALCIUM  8.6*   GFR: Estimated Creatinine Clearance: 115 mL/min (by C-G formula based on SCr of 0.64 mg/dL). Liver Function Tests: Recent Labs  Lab 04/22/24 1733  AST 153*  ALT 80*  ALKPHOS 120  BILITOT 2.4*  PROT 8.4*  ALBUMIN  3.5   Recent Labs  Lab 04/22/24 1733  LIPASE 50   No results for input(s): AMMONIA in the last 168 hours. Coagulation Profile: No results for input(s): INR, PROTIME in the last 168 hours. Cardiac Enzymes: No results for input(s): CKTOTAL, CKMB, CKMBINDEX, TROPONINI in the last 168 hours. BNP (last 3 results) No results for input(s): PROBNP in the last 8760 hours. HbA1C: No  results for input(s): HGBA1C in the last 72 hours. CBG: Recent Labs  Lab 04/22/24 1908  GLUCAP 173*   Lipid Profile: No results for input(s): CHOL, HDL, LDLCALC, TRIG, CHOLHDL, LDLDIRECT in the last 72 hours. Thyroid  Function Tests: No results for input(s): TSH, T4TOTAL, FREET4, T3FREE, THYROIDAB in the last 72 hours. Anemia Panel: No results for input(s): VITAMINB12, FOLATE, FERRITIN, TIBC, IRON, RETICCTPCT in the last 72 hours. Urine analysis:    Component Value Date/Time   COLORURINE YELLOW 01/10/2018 0500   APPEARANCEUR CLEAR 01/10/2018 0500   LABSPEC 1.023 01/10/2018 0500   PHURINE 5.0 01/10/2018 0500   GLUCOSEU NEGATIVE 01/10/2018 0500   HGBUR NEGATIVE 01/10/2018 0500   BILIRUBINUR neg 04/25/2023 1426   KETONESUR NEGATIVE 01/10/2018 0500   PROTEINUR Negative 04/25/2023 1426   PROTEINUR NEGATIVE 01/10/2018 0500   UROBILINOGEN 0.2 04/25/2023 1426   NITRITE neg 04/25/2023 1426   NITRITE NEGATIVE 01/10/2018 0500   LEUKOCYTESUR Negative 04/25/2023 1426   Sepsis Labs: @LABRCNTIP (procalcitonin:4,lacticidven:4) )No results found for this or any previous visit (from the past 240 hours).   Radiological Exams on Admission: CT Angio Chest/Abd/Pel for Dissection W and/or Wo Contrast Result Date: 04/22/2024 EXAM: CTA CHEST, ABDOMEN AND PELVIS WITH AND WITHOUT CONTRAST 04/22/2024 08:17:57 PM TECHNIQUE: CTA of the chest was performed with and without the administration of intravenous contrast. CTA of the abdomen and pelvis was performed with and without the administration of intravenous contrast. Multiplanar reformatted images are provided for review. MIP images are provided for review. Automated exposure control, iterative reconstruction, and/or weight based adjustment of the mA/kV was utilized to reduce the radiation dose to as low as reasonably achievable. 100 mL of iohexol  (OMNIPAQUE ) 350 MG/ML injection was administered. COMPARISON: 08/21/2023  CLINICAL HISTORY: Acute aortic syndrome (AAS) suspected; Patient having rather sharp severe pain just left and anterior of his sternotomy site. Evaluate for possible hernia, defect, or underlying acute vascular abnormality. Pain localizes to the anterior slightly left chest wall along the edge of the left sternum and some into the left upper quadrant. FINDINGS: VASCULATURE: AORTA: Aortic atherosclerosis. No aortic dissection. No acute finding. No abdominal aortic aneurysm. PULMONARY ARTERIES: No pulmonary embolism with the limits of this exam. GREAT VESSELS OF AORTIC ARCH: No acute finding. No dissection. No arterial occlusion or significant stenosis. 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 SUPERIOR MESENTERIC ARTERY: No acute finding. No occlusion or significant stenosis. INFERIOR MESENTERIC ARTERY: No acute finding. No occlusion or  significant stenosis. RENAL ARTERIES: No acute finding. No occlusion or significant stenosis. ILIAC ARTERIES: Iliac atherosclerosis. No acute finding. No occlusion or significant stenosis. CHEST: MEDIASTINUM: Prior CABG. Mild cardiomegaly. No mediastinal lymphadenopathy. The heart and pericardium demonstrate no acute abnormality. LUNGS AND PLEURA: Left basilar atelectasis. No focal consolidation or pulmonary edema. No evidence of pleural effusion or pneumothorax. THORACIC BONES AND SOFT TISSUES: No acute bone or soft tissue abnormality. ABDOMEN AND PELVIS: LIVER: Hepatic steatosis. GALLBLADDER AND BILE DUCTS: Gallbladder is unremarkable. No biliary ductal dilatation. SPLEEN: The spleen is unremarkable. PANCREAS: The pancreas is unremarkable. ADRENAL GLANDS: Bilateral adrenal glands demonstrate no acute abnormality. KIDNEYS, URETERS AND BLADDER: No stones in the kidneys or ureters. No hydronephrosis. No perinephric or periureteral stranding. Urinary bladder is unremarkable. GI AND BOWEL: Stomach and duodenal sweep demonstrate no acute abnormality. No active  diverticulitis. Normal appendix. There is no bowel obstruction. No abnormal bowel wall thickening or distension. REPRODUCTIVE: Reproductive organs are unremarkable. PERITONEUM AND RETROPERITONEUM: No ascites or free air. LYMPH NODES: No lymphadenopathy. ABDOMINAL BONES AND SOFT TISSUES: There is a midline hernia in the epigastric region just below the sternum containing fat. Umbilical hernia also noted containing fat. No acute abnormality of the bones. IMPRESSION: 1. No acute aortic syndrome. 2. 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 3. Midline epigastric fat-containing hernia and umbilical fat-containing hernia. Electronically signed by: Franky Crease MD 04/22/2024 08:42 PM EST RP Workstation: [id number]   DG Chest Portable 1 View Result Date: 04/22/2024 EXAM: 1 VIEW(S) XRAY OF THE CHEST 04/22/2024 05:27:00 PM COMPARISON: None available. CLINICAL HISTORY: chest pain FINDINGS: LUNGS AND PLEURA: No focal pulmonary opacity. No pulmonary edema. No pleural effusion. No pneumothorax. HEART AND MEDIASTINUM: Unchanged cardiomediastinal silhouette. Atherosclerotic plaque. BONES AND SOFT TISSUES: Sternotomy wires similar appearing with the third from bottom fractures. IMPRESSION: 1. No acute cardiopulmonary process. Electronically signed by: Morgane Naveau MD 04/22/2024 05:55 PM EST RP Workstation: [id number]    EKG: Normal sinus rhythm at 60 bpm with normal axis and nonspecific ST-T wave changes.   Assessment/Plan  This is a 53 y.o. male with a history of EtOH, esophageal varices, CAD status post CABG, HTN, HLD, ischemic cardiomyopathy, pulm HTN, hypothyroidism, TIA  now being admitted with:  #. Chest pain, most likely musculoskeletal or costochondral in nature rule out ACS - Admit to inpatient, telemetry monitoring - Trend troponins, check lipids and TSH. -Pain control -Consider cardiology consult  #.  Acute alcohol withdrawal, impending DTs -Will place in stepdown - CIWA protocol -Fall  aspiration and seizure precautions - Vitamins - Social work consult  #.  Mild hypokalemia - Replace orally - Check mag level - Trend BMP  #. History of elevated LFTs including AST, ALT, total bilirubin all likely related to EtOH - Right upper quadrant ultrasound  #. History of CAD - Continue carvedilol , Plavix   #. History of hypothyroidism - Continue Synthroid   Admission status: Inpatient, stepdown IV Fluids: Normal saline Diet/Nutrition: Heart healthy Consults called: Cardiology, social work DVT Px:, SCDs and early ambulation. Code Status: Full Code  Disposition Plan: To home in 1-2 days  All the records are reviewed and case discussed with ED provider. Management plans discussed with the patient and/or family who express understanding and agree with plan of care.  Joyceline Maiorino D.O. on 04/22/2024 at 9:31 PM CC: Primary care physician; Bernardo Fend, DO   04/22/2024, 9:31 PM

## 2024-04-22 NOTE — ED Provider Notes (Signed)
 [facility] Provider Note    Event Date/Time   First MD Initiated Contact with Patient 04/22/24 1719     (approximate)   History   Chest Pain   HPI  Edward Powell is a 53 y.o. male history of coronary disease prior coronary bypass, stenting.  History of heart failure with reduced EF, ischemic cardiomyopathy, hypertension, also noted alcohol use disorder  Has been having sharp severe episodes of chest pain just along the left side of his breastbone and he reports the area feels tender or swollen.  No fevers or chills is not red but he feels like there is something hard around about the area of his left nipple and the edge of his sternum.  Pain is sharp intermittent.  At times it is very severe.  There is no nausea or vomiting.  At times he feels faint.   Patient reports he is taking all of his medications today including his carvedilol , Plavix ,  He has not noticed any abdominal pain no nausea or vomiting.  No bloody emesis.[Prior discharge summary denotes a known history of grade 1 varices]  Physical Exam   Triage Vital Signs: ED Triage Vitals  Encounter Vitals Group     BP 04/22/24 1648 (!) 186/101     Girls Systolic BP Percentile --      Girls Diastolic BP Percentile --      Boys Systolic BP Percentile --      Boys Diastolic BP Percentile --      Pulse Rate 04/22/24 1648 66     Resp 04/22/24 1648 18     Temp 04/22/24 1648 98.1 F (36.7 C)     Temp Source 04/22/24 1648 Oral     SpO2 04/22/24 1648 97 %     Weight 04/22/24 1644 197 lb 15.6 oz (89.8 kg)     Height 04/22/24 1644 5' 8 (1.727 m)     Head Circumference --      Peak Flow --      Pain Score 04/22/24 1644 8     Pain Loc --      Pain Education --      Exclude from Growth Chart --     Most recent vital signs: Vitals:   04/22/24 2240 04/22/24 2300  BP: (!) 172/107 (!) 148/94  Pulse: 67 63  Resp:  10  Temp:    SpO2:  95%  Blood pressure notably elevated but also reporting  episodes of severe pain, will medicate for pain and continue observe blood pressure   General: Awake, appears in some painful distress he reports with intermittent sharp severe along the left side of the breastbone His sternotomy site almost feels as though it has a slight defect to it and his wife and him report do not really sure if it really fully healed as he had some falls after the sternotomy was performed in the distant past.  He now reports and has an area of reproducible tenderness just left of the anterior left sternotomy site.  He reports it feels slightly swollen no to my evaluation it is tender but I do not see or feel any distinct mass in the area but there is definitely reproducible tenderness along the left lateral chest wall.  He also has some element of pain when I press in his left upper quadrant that produces the pain in the left chest CV:  Good peripheral perfusion.  Normal tones and rate Resp:  Normal effort.  Clear  bilateral Abd:  No distention.  Soft nontender nondistended with soft easily reducible umbilical hernia but he reports is chronic except when palpated in the left upper quadrant he reports pain that is moderate and also radiates to the area of pain along the left sternum.  There is no frank peritonitis Other:  No lower extremity edema   ED Results / Procedures / Treatments   Labs (all labs ordered are listed, but only abnormal results are displayed) Labs Reviewed  BASIC METABOLIC PANEL WITH GFR - Abnormal; Notable for the following components:      Result Value   Potassium 3.1 (*)    CO2 21 (*)    Glucose, Bld 129 (*)    Calcium  8.6 (*)    Anion gap 17 (*)    All other components within normal limits  CBC - Abnormal; Notable for the following components:   MCHC 36.1 (*)    Platelets 101 (*)    All other components within normal limits  HEPATIC FUNCTION PANEL - Abnormal; Notable for the following components:   Total Protein 8.4 (*)    AST 153 (*)    ALT  80 (*)    Total Bilirubin 2.4 (*)    Bilirubin, Direct 0.5 (*)    Indirect Bilirubin 1.9 (*)    All other components within normal limits  MAGNESIUM  - Abnormal; Notable for the following components:   Magnesium  1.2 (*)    All other components within normal limits  TSH - Abnormal; Notable for the following components:   TSH 5.647 (*)    All other components within normal limits  LIPID PANEL - Abnormal; Notable for the following components:   Cholesterol 287 (*)    LDL Cholesterol 222 (*)    All other components within normal limits  CBG MONITORING, ED - Abnormal; Notable for the following components:   Glucose-Capillary 173 (*)    All other components within normal limits  LIPASE, BLOOD  PHOSPHORUS  COMPREHENSIVE METABOLIC PANEL WITH GFR  CBC  TROPONIN I (HIGH SENSITIVITY)  TROPONIN I (HIGH SENSITIVITY)  TROPONIN I (HIGH SENSITIVITY)  TROPONIN I (HIGH SENSITIVITY)     EKG  Inter by me at 1730 heart rate 60 QRS 100 QTc 460 Sinus rhythm, no evidence of acute ischemia.  Slight artifact.  No evidence of frank ischemia on either ECG   RADIOLOGY  CT Angio Chest/Abd/Pel for Dissection W and/or Wo Contrast Result Date: 04/22/2024 EXAM: CTA CHEST, ABDOMEN AND PELVIS WITH AND WITHOUT CONTRAST 04/22/2024 08:17:57 PM TECHNIQUE: CTA of the chest was performed with and without the administration of intravenous contrast. CTA of the abdomen and pelvis was performed with and without the administration of intravenous contrast. Multiplanar reformatted images are provided for review. MIP images are provided for review. Automated exposure control, iterative reconstruction, and/or weight based adjustment of the mA/kV was utilized to reduce the radiation dose to as low as reasonably achievable. 100 mL of iohexol  (OMNIPAQUE ) 350 MG/ML injection was administered. COMPARISON: 08/21/2023 CLINICAL HISTORY: Acute aortic syndrome (AAS) suspected; Patient having rather sharp severe pain just left and anterior  of his sternotomy site. Evaluate for possible hernia, defect, or underlying acute vascular abnormality. Pain localizes to the anterior slightly left chest wall along the edge of the left sternum and some into the left upper quadrant. FINDINGS: VASCULATURE: AORTA: Aortic atherosclerosis. No aortic dissection. No acute finding. No abdominal aortic aneurysm. PULMONARY ARTERIES: No pulmonary embolism with the limits of this exam. GREAT VESSELS OF AORTIC ARCH: No  acute finding. No dissection. No arterial occlusion or significant stenosis. 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 SUPERIOR MESENTERIC ARTERY: No acute finding. No occlusion or significant stenosis. INFERIOR MESENTERIC ARTERY: No acute finding. No occlusion or significant stenosis. RENAL ARTERIES: No acute finding. No occlusion or significant stenosis. ILIAC ARTERIES: Iliac atherosclerosis. No acute finding. No occlusion or significant stenosis. CHEST: MEDIASTINUM: Prior CABG. Mild cardiomegaly. No mediastinal lymphadenopathy. The heart and pericardium demonstrate no acute abnormality. LUNGS AND PLEURA: Left basilar atelectasis. No focal consolidation or pulmonary edema. No evidence of pleural effusion or pneumothorax. THORACIC BONES AND SOFT TISSUES: No acute bone or soft tissue abnormality. ABDOMEN AND PELVIS: LIVER: Hepatic steatosis. GALLBLADDER AND BILE DUCTS: Gallbladder is unremarkable. No biliary ductal dilatation. SPLEEN: The spleen is unremarkable. PANCREAS: The pancreas is unremarkable. ADRENAL GLANDS: Bilateral adrenal glands demonstrate no acute abnormality. KIDNEYS, URETERS AND BLADDER: No stones in the kidneys or ureters. No hydronephrosis. No perinephric or periureteral stranding. Urinary bladder is unremarkable. GI AND BOWEL: Stomach and duodenal sweep demonstrate no acute abnormality. No active diverticulitis. Normal appendix. There is no bowel obstruction. No abnormal bowel wall thickening or distension.  REPRODUCTIVE: Reproductive organs are unremarkable. PERITONEUM AND RETROPERITONEUM: No ascites or free air. LYMPH NODES: No lymphadenopathy. ABDOMINAL BONES AND SOFT TISSUES: There is a midline hernia in the epigastric region just below the sternum containing fat. Umbilical hernia also noted containing fat. No acute abnormality of the bones. IMPRESSION: 1. No acute aortic syndrome. 2. 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 3. Midline epigastric fat-containing hernia and umbilical fat-containing hernia. Electronically signed by: Franky Crease MD 04/22/2024 08:42 PM EST RP Workstation: [id number]   DG Chest Portable 1 View Result Date: 04/22/2024 EXAM: 1 VIEW(S) XRAY OF THE CHEST 04/22/2024 05:27:00 PM COMPARISON: None available. CLINICAL HISTORY: chest pain FINDINGS: LUNGS AND PLEURA: No focal pulmonary opacity. No pulmonary edema. No pleural effusion. No pneumothorax. HEART AND MEDIASTINUM: Unchanged cardiomediastinal silhouette. Atherosclerotic plaque. BONES AND SOFT TISSUES: Sternotomy wires similar appearing with the third from bottom fractures. IMPRESSION: 1. No acute cardiopulmonary process. Electronically signed by: Morgane Naveau MD 04/22/2024 05:55 PM EST RP Workstation: [id number]      PROCEDURES:  Critical Care performed: No  Procedures   MEDICATIONS ORDERED IN ED: Medications  folic acid  (FOLVITE ) tablet 1 mg (1 mg Oral Given 04/22/24 2024)  multivitamin with minerals tablet 1 tablet (1 tablet Oral Given 04/22/24 2024)  carvedilol  (COREG ) tablet 25 mg (has no administration in time range)  clopidogrel  (PLAVIX ) tablet 75 mg (has no administration in time range)  LORazepam  (ATIVAN ) tablet 1-4 mg (has no administration in time range)    Or  LORazepam  (ATIVAN ) injection 1-4 mg (has no administration in time range)  thiamine  (VITAMIN B1) tablet 100 mg (has no administration in time range)    Or  thiamine  (VITAMIN B1) injection 100 mg (has no administration in time range)   sodium chloride  flush (NS) 0.9 % injection 3 mL (3 mLs Intravenous Given 04/22/24 2241)  0.9 %  sodium chloride  infusion ( Intravenous New Bag/Given 04/22/24 2318)  acetaminophen  (TYLENOL ) tablet 650 mg (has no administration in time range)    Or  acetaminophen  (TYLENOL ) suppository 650 mg (has no administration in time range)  HYDROcodone -acetaminophen  (NORCO/VICODIN) 5-325 MG per tablet 1-2 tablet (has no administration in time range)  morphine  (PF) 2 MG/ML injection 1 mg (has no administration in time range)  zolpidem (AMBIEN) tablet 5 mg (has no administration in time range)  senna-docusate (  Senokot-S) tablet 1 tablet (has no administration in time range)  bisacodyl  (DULCOLAX) EC tablet 5 mg (has no administration in time range)  ondansetron  (ZOFRAN ) tablet 4 mg (has no administration in time range)    Or  ondansetron  (ZOFRAN ) injection 4 mg (has no administration in time range)  hydrALAZINE  (APRESOLINE ) injection 5 mg (has no administration in time range)  LORazepam  (ATIVAN ) tablet 0-4 mg ( Oral Not Given 04/22/24 2241)    Followed by  LORazepam  (ATIVAN ) tablet 0-4 mg (has no administration in time range)  potassium chloride  SA (KLOR-CON  M) CR tablet 30 mEq (30 mEq Oral Given 04/22/24 2209)  levothyroxine  (SYNTHROID ) tablet 25 mcg (has no administration in time range)  HYDROmorphone  (DILAUDID ) injection 1 mg (1 mg Intravenous Given 04/22/24 1737)  ondansetron  (ZOFRAN ) injection 4 mg (4 mg Intravenous Given 04/22/24 1737)  iohexol  (OMNIPAQUE ) 350 MG/ML injection 100 mL (100 mLs Intravenous Contrast Given 04/22/24 2008)  ondansetron  (ZOFRAN ) injection 4 mg (4 mg Intravenous Given 04/22/24 1901)  LORazepam  (ATIVAN ) injection 3 mg (3 mg Intravenous Given 04/22/24 1901)     IMPRESSION / MDM / ASSESSMENT AND PLAN / ED COURSE  I reviewed the triage vital signs and the nursing notes.                              Differential diagnosis includes, but is not limited to, ACS, aortic dissection,  pulmonary embolism, cardiac tamponade, pneumothorax, pneumonia, pericarditis, myocarditis, GI-related causes including esophagitis/gastritis, and musculoskeletal chest wall pain.    Since chief complaint being primarily a sharp somewhat atypical chest pain but reproducible along the left sternal border of the previous sternotomy site and also has associated reproduction of pain to palpation left upper quadrant raising potential causes potential associated gastrointestinal as well has a known history of heavy alcohol use, varices.  He has no acute vomiting or obvious or frank GI symptoms though to suggest a high concern for acute GI cause though potential internal hernia, perforation, peptic ulcer, etc. would be considered as well but just complaint seems to primarily be along the left sternal border.  Will proceed with CT scan including angiography to evaluate for aortic pathology or abnormality in the left chest wall.  Also cardiac evaluation.  Medicating for with pain.  He is on antiplatelet therapy  Patient's presentation is most consistent with acute presentation with potential threat to life or bodily function.   The patient is on the cardiac monitor to evaluate for evidence of arrhythmia and/or significant heart rate changes.  Clinical Course as of 04/23/24 0027  Mon Apr 22, 2024  8092 Patient blood pressure is elevated he is not diaphoretic tremulous and reports he feels like he started feel little confused.  And further history taking he reports heavy alcohol use and last drink 24 hours ago.  Feels like he is entering withdrawals.  Has a history of same.  Awaiting imaging, will initiate Ativan  at this time.  He is alert in no acute distress but does appear to have evidence of what I suspect is acute withdrawal [MQ]  2016 Patient's blood pressure now 150 systolic he is no longer diaphoretic he is alert and oriented tremulousness has gone away and he feels much better.  Treatment with lorazepam  for  suspected early alcohol withdrawal has been highly effective at this point [MQ]    Clinical Course User Index [MQ] Dicky Anes, MD   Patient markedly improved after receiving Ativan  blood pressure  improved tremors improved, he is calm.  He developed symptoms concerning for alcohol withdrawal with delirium.  Thus far his cardiac workup reassuring.  Celiac artery stenosis noted but given his primary complaint of left sided chest pain is quite reproducible I suspect this is unlikely cause of his pain at this point but further care management examination to occur and to the hospitalist service.  Primarily being admitted for concerns of chest pain and alcohol withdrawal with early evidence of delirium  Consulted with the patient excepted hospital service by Dr. Florie  FINAL CLINICAL IMPRESSION(S) / ED DIAGNOSES   Final diagnoses:  Chest pain, unspecified type  Alcohol withdrawal delirium (HCC)     Rx / DC Orders   ED Discharge Orders     None        Note:  This document was prepared using Dragon voice recognition software and may include unintentional dictation errors.   Dicky Anes, MD 04/23/24 GLORIANNE

## 2024-04-23 ENCOUNTER — Inpatient Hospital Stay

## 2024-04-23 ENCOUNTER — Other Ambulatory Visit: Payer: Self-pay

## 2024-04-23 ENCOUNTER — Encounter: Payer: Self-pay | Admitting: Family Medicine

## 2024-04-23 DIAGNOSIS — F10931 Alcohol use, unspecified with withdrawal delirium: Secondary | ICD-10-CM | POA: Diagnosis not present

## 2024-04-23 DIAGNOSIS — Z0389 Encounter for observation for other suspected diseases and conditions ruled out: Secondary | ICD-10-CM | POA: Diagnosis not present

## 2024-04-23 LAB — CBC
HCT: 40.9 % (ref 39.0–52.0)
Hemoglobin: 14.5 g/dL (ref 13.0–17.0)
MCH: 33 pg (ref 26.0–34.0)
MCHC: 35.5 g/dL (ref 30.0–36.0)
MCV: 93 fL (ref 80.0–100.0)
Platelets: 76 K/uL — ABNORMAL LOW (ref 150–400)
RBC: 4.4 MIL/uL (ref 4.22–5.81)
RDW: 13.9 % (ref 11.5–15.5)
WBC: 5.3 K/uL (ref 4.0–10.5)
nRBC: 0 % (ref 0.0–0.2)

## 2024-04-23 LAB — LIPID PANEL
Cholesterol: 287 mg/dL — ABNORMAL HIGH (ref 0–200)
HDL: 45 mg/dL (ref >40)
LDL Cholesterol: 222 mg/dL — ABNORMAL HIGH (ref 0–99)
Total CHOL/HDL Ratio: 6.4 ratio
Triglycerides: 98 mg/dL (ref <150)
VLDL: 20 mg/dL (ref 0–40)

## 2024-04-23 LAB — COMPREHENSIVE METABOLIC PANEL WITH GFR
ALT: 68 U/L — ABNORMAL HIGH (ref 0–44)
AST: 125 U/L — ABNORMAL HIGH (ref 15–41)
Albumin: 3.3 g/dL — ABNORMAL LOW (ref 3.5–5.0)
Alkaline Phosphatase: 114 U/L (ref 38–126)
Anion gap: 13 (ref 5–15)
BUN: 12 mg/dL (ref 6–20)
CO2: 25 mmol/L (ref 22–32)
Calcium: 8.7 mg/dL — ABNORMAL LOW (ref 8.9–10.3)
Chloride: 99 mmol/L (ref 98–111)
Creatinine, Ser: 0.68 mg/dL (ref 0.61–1.24)
GFR, Estimated: >60 mL/min (ref >60)
Glucose, Bld: 108 mg/dL — ABNORMAL HIGH (ref 70–99)
Potassium: 3.3 mmol/L — ABNORMAL LOW (ref 3.5–5.1)
Sodium: 137 mmol/L (ref 135–145)
Total Bilirubin: 2.6 mg/dL — ABNORMAL HIGH (ref 0.0–1.2)
Total Protein: 8 g/dL (ref 6.5–8.1)

## 2024-04-23 LAB — MAGNESIUM: Magnesium: 1.2 mg/dL — ABNORMAL LOW (ref 1.7–2.4)

## 2024-04-23 LAB — MRSA NEXT GEN BY PCR, NASAL: MRSA by PCR Next Gen: NOT DETECTED

## 2024-04-23 LAB — TROPONIN I (HIGH SENSITIVITY)
Troponin I (High Sensitivity): 12 ng/L (ref <18)
Troponin I (High Sensitivity): 19 ng/L — ABNORMAL HIGH (ref <18)

## 2024-04-23 LAB — TSH: TSH: 5.647 u[IU]/mL — ABNORMAL HIGH (ref 0.350–4.500)

## 2024-04-23 LAB — PHOSPHORUS: Phosphorus: 3.3 mg/dL (ref 2.5–4.6)

## 2024-04-23 LAB — GLUCOSE, CAPILLARY: Glucose-Capillary: 123 mg/dL — ABNORMAL HIGH (ref 70–99)

## 2024-04-23 MED ORDER — ENOXAPARIN SODIUM 40 MG/0.4ML IJ SOSY
40.0000 mg | PREFILLED_SYRINGE | INTRAMUSCULAR | Status: DC
  Administered 2024-04-23 – 2024-04-24 (×2): 40 mg via SUBCUTANEOUS
  Filled 2024-04-23 (×2): qty 0.4

## 2024-04-23 MED ORDER — MAGNESIUM SULFATE 4 GM/100ML IV SOLN
4.0000 g | Freq: Once | INTRAVENOUS | Status: AC
  Administered 2024-04-23: 4 g via INTRAVENOUS
  Filled 2024-04-23: qty 100

## 2024-04-23 MED ORDER — CHLORHEXIDINE GLUCONATE CLOTH 2 % EX PADS
6.0000 | MEDICATED_PAD | Freq: Every day | CUTANEOUS | Status: DC
  Administered 2024-04-23 – 2024-04-24 (×2): 6 via TOPICAL

## 2024-04-23 NOTE — Plan of Care (Signed)
  Problem: Education: Goal: Knowledge of General Education information will improve Description: Including pain rating scale, medication(s)/side effects and non-pharmacologic comfort measures Outcome: Progressing   Problem: Clinical Measurements: Goal: Respiratory complications will improve Outcome: Progressing Goal: Cardiovascular complication will be avoided Outcome: Progressing   Problem: Safety: Goal: Ability to remain free from injury will improve Outcome: Progressing   Problem: Skin Integrity: Goal: Risk for impaired skin integrity will decrease Outcome: Progressing   

## 2024-04-23 NOTE — Progress Notes (Signed)
 PROGRESS NOTE    Edward Powell   FMW:[id number] DOB: 1970-10-04  DOA: 04/22/2024 Date of Service: 04/23/24 which is hospital day 1  PCP: Bernardo Fend, [facility] course / significant events:   Edward Powell is a 53 y.o. male with a known history of EtOH, esophageal varices, CAD status post CABG, HTN, HLD, ischemic cardiomyopathy, pulm HTN, hypothyroidism, TIA presents to the emergency department for evaluation of chest pain.   HPI: onset of sharp chest pain on the left side of his sternum with tenderness.  He reports a fullness and swelling along with exquisite tenderness to palpation.  His wife at bedside who provides the majority of the history states that he has not had any injuries, exercise or other inciting events that would have caused pain. With respect to his alcohol use his wife states that he binge drinks for several days in a row and then will go several weeks without drinking any at all.  He has been drinking significant amounts of alcohol in the last week or so.  Last drink was yesterday.  He has detoxed on his own and in the hospital.  11/03: to ED. Pt became diaphoretic and hypertensive with symptoms of confusion, consistent with acute alcohol withdrawal.  He he received Ativan  multivitamin folic acid  and thiamine  as well as Dilaudid  and Zofran . Medical admission for further management of reproducible chest wall pain as well as acute alcohol withdrawal syndrome.  11/04: more alert today, persistent reproducible chest wall pain/tenderness getting ultrasound to evaluate for point tenderness question cyst on palpation, transfer out stepdown.      Consultants:  None  Procedures/Surgeries: None      ASSESSMENT & PLAN:   Acute alcohol withdrawal Given hypertension, concern for impending DTs Confusion/encephalopathy on admission, resolved Transfer out of stepdown Maintain CIWA, he is still in window for withdrawals Maintain telemetry for now Fall  aspiration and seizure precautions High-dose thiamine , folic acid  Patient was educated on alcohol abstinence  Chest pain, most likely musculoskeletal or costochondral in nature -small area of point tenderness left anterior chest wall Have reasonably ruled out ACS telemetry monitoring Trend troponins --> flat  lipids --> LDL well above goal at 222 TSH --> slightly abnormal, will get free T4, follow-up outpatient Ultrasound area of point tenderness, question cyst, scar tissue from CABG, costochondritis  Hypokalemia Replace as needed Monitor BMP  Elevated LFTs including AST, ALT, total bilirubin  RUQ ultrasound consistent with steatosis, EtOH effects -clinically significant ultrasound findings all likely related to EtOH Patient was educated on alcohol abstinence Trend LFT Outpatient follow-up   History of CAD, CABG carvedilol , Plavix  LDL above goal but with liver disease will hold statin for now   History of hypothyroidism TSH slightly elevated, not seriously concerned given acute illness Continue Synthroid   follow-up outpatient recheck TSH  Lower extremity numbness/tingling Question neuropathy / RLS B12, iron pending  Gabapentin consider outpatient but avoid for now given AMS risk    Overweight/class I obesity based on BMI: Body mass index is 29 kg/m.SABRA Significantly low or high BMI is associated with higher medical risk.  Underweight - under 18  overweight - 25 to 29 obese - 30 or more Class 1 obesity: BMI of 30.0 to 34 Class 2 obesity: BMI of 35.0 to 39 Class 3 obesity: BMI of 40.0 to 49 Super Morbid Obesity: BMI 50-59 Super-super Morbid Obesity: BMI 60+ Healthy nutrition and physical activity advised as adjunct to other disease management and risk reduction treatments  DVT prophylaxis: Lovenox  IV fluids: Have discontinued continuous IV fluids  Nutrition: Cardiac diet Central lines / other devices: None  Code Status: Full code ACP documentation reviewed:   none on file in [facility]  TOC needs: TBD, expect back home to previous environment Medical barriers to dispo: Monitoring for alcohol withdrawal at least another 24 hours. Expected medical readiness for discharge probably tomorrow if no further withdrawal symptoms.              Subjective / Brief ROS:  Patient reports lower extremity numbness/tingling which has been going on for a long time, occasional shortness of breath, feels that he just cannot get a deep breath sometimes, lasts for a few moments at a time, comes at random, over the past couple of weeks, no chest pain with these episodes. Denies CP/SOB now, other than chest pain on palpation of the left anterior chest wall which has been consistent over past couple of days Pain controlled.  Denies new weakness.  Tolerating diet.  Reports no concerns w/ urination/defecation.   Family Communication: pt's father at bedside on rounds    Objective Findings:  Vitals:   04/23/24 1245 04/23/24 1300 04/23/24 1315 04/23/24 1330  BP:  106/71  120/76  Pulse: 65 (!) 57 (!) 53 (!) 55  Resp: 12 10 11 11   Temp:      TempSrc:      SpO2:      Weight:      Height:        Intake/Output Summary (Last 24 hours) at 04/23/2024 1356 Last data filed at 04/23/2024 1346 Gross per 24 hour  Intake 1383.31 ml  Output --  Net 1383.31 ml   Filed Weights   04/22/24 1644 04/23/24 0035  Weight: 89.8 kg 86.5 kg    Examination:  Physical Exam Constitutional:      General: He is not in acute distress. Cardiovascular:     Rate and Rhythm: Normal rate and regular rhythm.     Heart sounds: Normal heart sounds.  Pulmonary:     Effort: Pulmonary effort is normal.     Breath sounds: Normal breath sounds.  Musculoskeletal:     Right lower leg: No edema.     Left lower leg: No edema.  Skin:    General: Skin is warm and dry.  Neurological:     General: No focal deficit present.     Mental Status: He is alert and oriented to person, place, and  time.  Psychiatric:        Mood and Affect: Mood normal.        Behavior: Behavior normal.          Scheduled Medications:   carvedilol   25 mg Oral BID WC   Chlorhexidine  Gluconate Cloth  6 each Topical Daily   clopidogrel   75 mg Oral Daily   enoxaparin  (LOVENOX ) injection  40 mg Subcutaneous Q24H   folic acid   1 mg Oral Daily   levothyroxine   25 mcg Oral Q0600   LORazepam   0-4 mg Oral Q4H   Followed by   NOREEN ON 04/24/2024] LORazepam   0-4 mg Oral Q8H   multivitamin with minerals  1 tablet Oral Daily   sodium chloride  flush  3 mL Intravenous Q12H   thiamine   100 mg Oral Daily   Or   thiamine   100 mg Intravenous Daily    Continuous Infusions:    PRN Medications:  bisacodyl , hydrALAZINE , HYDROcodone -acetaminophen , LORazepam  **OR** LORazepam , morphine  injection, ondansetron  **OR** ondansetron  (ZOFRAN ) IV,  senna-docusate  Antimicrobials from admission:  Anti-infectives (From admission, onward)    None           Data Reviewed:  I have personally reviewed the following...  CBC: Recent Labs  Lab 04/22/24 1733 04/23/24 0722  WBC 5.9 5.3  HGB 15.1 14.5  HCT 41.8 40.9  MCV 91.5 93.0  PLT 101* 76*   Basic Metabolic Panel: Recent Labs  Lab 04/22/24 1733 04/22/24 2305 04/23/24 0722  NA 137  --  137  K 3.1*  --  3.3*  CL 99  --  99  CO2 21*  --  25  GLUCOSE 129*  --  108*  BUN 9  --  12  CREATININE 0.64  --  0.68  CALCIUM  8.6*  --  8.7*  MG  --  1.2*  --   PHOS  --  3.3  --    GFR: Estimated Creatinine Clearance: 112.9 mL/min (by C-G formula based on SCr of 0.68 mg/dL). Liver Function Tests: Recent Labs  Lab 04/22/24 1733 04/23/24 0722  AST 153* 125*  ALT 80* 68*  ALKPHOS 120 114  BILITOT 2.4* 2.6*  PROT 8.4* 8.0  ALBUMIN  3.5 3.3*   Recent Labs  Lab 04/22/24 1733  LIPASE 50   No results for input(s): AMMONIA in the last 168 hours. Coagulation Profile: No results for input(s): INR, PROTIME in the last 168 hours. Cardiac  Enzymes: No results for input(s): CKTOTAL, CKMB, CKMBINDEX, TROPONINI in the last 168 hours. BNP (last 3 results) No results for input(s): PROBNP in the last 8760 hours. HbA1C: No results for input(s): HGBA1C in the last 72 hours. CBG: Recent Labs  Lab 04/22/24 1908 04/23/24 0032  GLUCAP 173* 123*   Lipid Profile: Recent Labs    04/22/24 2305  CHOL 287*  HDL 45  LDLCALC 222*  TRIG 98  CHOLHDL 6.4   Thyroid  Function Tests: Recent Labs    04/22/24 2305  TSH 5.647*   Anemia Panel: No results for input(s): VITAMINB12, FOLATE, FERRITIN, TIBC, IRON, RETICCTPCT in the last 72 hours. Most Recent Urinalysis On File:     Component Value Date/Time   COLORURINE YELLOW 01/10/2018 0500   APPEARANCEUR CLEAR 01/10/2018 0500   LABSPEC 1.023 01/10/2018 0500   PHURINE 5.0 01/10/2018 0500   GLUCOSEU NEGATIVE 01/10/2018 0500   HGBUR NEGATIVE 01/10/2018 0500   BILIRUBINUR neg 04/25/2023 1426   KETONESUR NEGATIVE 01/10/2018 0500   PROTEINUR Negative 04/25/2023 1426   PROTEINUR NEGATIVE 01/10/2018 0500   UROBILINOGEN 0.2 04/25/2023 1426   NITRITE neg 04/25/2023 1426   NITRITE NEGATIVE 01/10/2018 0500   LEUKOCYTESUR Negative 04/25/2023 1426   Sepsis Labs: @LABRCNTIP (procalcitonin:4,lacticidven:4) Microbiology: Recent Results (from the past 240 hours)  MRSA Next Gen by PCR, Nasal     Status: None   Collection Time: 04/23/24 11:16 AM   Specimen: Nasal Mucosa; Nasal Swab  Result Value Ref Range Status   MRSA by PCR Next Gen NOT DETECTED NOT DETECTED Final    Comment: (NOTE) The GeneXpert MRSA Assay (FDA approved for NASAL specimens only), is one component of a comprehensive MRSA colonization surveillance program. It is not intended to diagnose MRSA infection nor to guide or monitor treatment for MRSA infections. Test performance is not FDA approved in patients less than 59 years old. Performed at [facility] [address], [place], [place]  [address]       Radiology Studies last 3 days: US  Abdomen Limited RUQ (LIVER/GB) Result Date: 04/23/2024 CLINICAL DATA:  Elevated liver  function tests. EXAM: ULTRASOUND ABDOMEN LIMITED RIGHT UPPER QUADRANT COMPARISON:  CTA abdomen 04/22/2024, prior right upper quadrant ultrasound 08/21/2023 FINDINGS: Gallbladder: Stable small gallstones without wall thickening or sonographic Murphy's sign. Common bile duct: Diameter: 2 mm. Liver: Stable increased echogenicity of the liver parenchyma likely reflecting steatosis. No overt cirrhotic morphology by ultrasound. No visualized focal masses or intrahepatic biliary ductal dilatation. Portal vein is patent on color Doppler imaging with normal direction of blood flow towards the liver. Other: No ascites visualized in the right upper quadrant. IMPRESSION: 1. Stable cholelithiasis without evidence of acute cholecystitis. 2. Stable increased echogenicity of the liver parenchyma likely reflecting steatosis. Electronically Signed   By: Marcey Moan M.D.   On: 04/23/2024 07:47   CT Angio Chest/Abd/Pel for Dissection W and/or Wo Contrast Result Date: 04/22/2024 EXAM: CTA CHEST, ABDOMEN AND PELVIS WITH AND WITHOUT CONTRAST 04/22/2024 08:17:57 PM TECHNIQUE: CTA of the chest was performed with and without the administration of intravenous contrast. CTA of the abdomen and pelvis was performed with and without the administration of intravenous contrast. Multiplanar reformatted images are provided for review. MIP images are provided for review. Automated exposure control, iterative reconstruction, and/or weight based adjustment of the mA/kV was utilized to reduce the radiation dose to as low as reasonably achievable. 100 mL of iohexol  (OMNIPAQUE ) 350 MG/ML injection was administered. COMPARISON: 08/21/2023 CLINICAL HISTORY: Acute aortic syndrome (AAS) suspected; Patient having rather sharp severe pain just left and anterior of his sternotomy site. Evaluate for possible hernia,  defect, or underlying acute vascular abnormality. Pain localizes to the anterior slightly left chest wall along the edge of the left sternum and some into the left upper quadrant. FINDINGS: VASCULATURE: AORTA: Aortic atherosclerosis. No aortic dissection. No acute finding. No abdominal aortic aneurysm. PULMONARY ARTERIES: No pulmonary embolism with the limits of this exam. GREAT VESSELS OF AORTIC ARCH: No acute finding. No dissection. No arterial occlusion or significant stenosis. 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 SUPERIOR MESENTERIC ARTERY: No acute finding. No occlusion or significant stenosis. INFERIOR MESENTERIC ARTERY: No acute finding. No occlusion or significant stenosis. RENAL ARTERIES: No acute finding. No occlusion or significant stenosis. ILIAC ARTERIES: Iliac atherosclerosis. No acute finding. No occlusion or significant stenosis. CHEST: MEDIASTINUM: Prior CABG. Mild cardiomegaly. No mediastinal lymphadenopathy. The heart and pericardium demonstrate no acute abnormality. LUNGS AND PLEURA: Left basilar atelectasis. No focal consolidation or pulmonary edema. No evidence of pleural effusion or pneumothorax. THORACIC BONES AND SOFT TISSUES: No acute bone or soft tissue abnormality. ABDOMEN AND PELVIS: LIVER: Hepatic steatosis. GALLBLADDER AND BILE DUCTS: Gallbladder is unremarkable. No biliary ductal dilatation. SPLEEN: The spleen is unremarkable. PANCREAS: The pancreas is unremarkable. ADRENAL GLANDS: Bilateral adrenal glands demonstrate no acute abnormality. KIDNEYS, URETERS AND BLADDER: No stones in the kidneys or ureters. No hydronephrosis. No perinephric or periureteral stranding. Urinary bladder is unremarkable. GI AND BOWEL: Stomach and duodenal sweep demonstrate no acute abnormality. No active diverticulitis. Normal appendix. There is no bowel obstruction. No abnormal bowel wall thickening or distension. REPRODUCTIVE: Reproductive organs are unremarkable. PERITONEUM AND  RETROPERITONEUM: No ascites or free air. LYMPH NODES: No lymphadenopathy. ABDOMINAL BONES AND SOFT TISSUES: There is a midline hernia in the epigastric region just below the sternum containing fat. Umbilical hernia also noted containing fat. No acute abnormality of the bones. IMPRESSION: 1. No acute aortic syndrome. 2. 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 3. Midline epigastric fat-containing hernia and umbilical fat-containing hernia. Electronically signed by: Franky Crease MD 04/22/2024  08:42 PM EST RP Workstation: [id number]   DG Chest Portable 1 View Result Date: 04/22/2024 EXAM: 1 VIEW(S) XRAY OF THE CHEST 04/22/2024 05:27:00 PM COMPARISON: None available. CLINICAL HISTORY: chest pain FINDINGS: LUNGS AND PLEURA: No focal pulmonary opacity. No pulmonary edema. No pleural effusion. No pneumothorax. HEART AND MEDIASTINUM: Unchanged cardiomediastinal silhouette. Atherosclerotic plaque. BONES AND SOFT TISSUES: Sternotomy wires similar appearing with the third from bottom fractures. IMPRESSION: 1. No acute cardiopulmonary process. Electronically signed by: Morgane Naveau MD 04/22/2024 05:55 PM EST RP Workstation: [id number]          Murray Guzzetta, DO [facility] 04/23/2024, 1:56 PM    Dictation software may have been used to generate the above note. Typos may occur and escape review in typed/dictated notes. Please contact Dr Marsa directly for clarity if needed.  Staff may message me via secure chat in Epic  but this may not receive an immediate response,  please page me for urgent matters!  If 7PM-7AM, please contact night coverage [email or web address]

## 2024-04-23 NOTE — Hospital Course (Addendum)
 Hospital course / significant events:   Edward Powell is a 53 y.o. male with a known history of EtOH, esophageal varices, CAD status post CABG, HTN, HLD, ischemic cardiomyopathy, pulm HTN, hypothyroidism, TIA presents to the emergency department for evaluation of chest pain.   HPI: onset of sharp chest pain on the left side of his sternum with tenderness.  He reports a fullness and swelling along with exquisite tenderness to palpation.  His wife at bedside who provides the majority of the history states that he has not had any injuries, exercise or other inciting events that would have caused pain. With respect to his alcohol use his wife states that he binge drinks for several days in a row and then will go several weeks without drinking any at all.  He has been drinking significant amounts of alcohol in the last week or so.  Last drink was yesterday.  He has detoxed on his own and in the hospital.  11/03: to ED. Pt became diaphoretic and hypertensive with symptoms of confusion, consistent with acute alcohol withdrawal.  He he received Ativan  multivitamin folic acid  and thiamine  as well as Dilaudid  and Zofran . Medical admission for further management of reproducible chest wall pain as well as acute alcohol withdrawal syndrome.  11/04: more alert today, persistent reproducible chest wall pain/tenderness getting ultrasound to evaluate for point tenderness question cyst on palpation, transfer out stepdown. 11/5.  Patient having headache since yesterday.  Normally does not get headache.  Still having this pain in his epigastric/lower chest area.  Has hernia there. 11/6.  Patient feeling better and wants to go home.

## 2024-04-23 NOTE — Plan of Care (Signed)

## 2024-04-24 ENCOUNTER — Inpatient Hospital Stay

## 2024-04-24 DIAGNOSIS — Z8679 Personal history of other diseases of the circulatory system: Secondary | ICD-10-CM

## 2024-04-24 DIAGNOSIS — R0602 Shortness of breath: Secondary | ICD-10-CM | POA: Diagnosis not present

## 2024-04-24 DIAGNOSIS — R7989 Other specified abnormal findings of blood chemistry: Secondary | ICD-10-CM | POA: Diagnosis not present

## 2024-04-24 DIAGNOSIS — F10931 Alcohol use, unspecified with withdrawal delirium: Secondary | ICD-10-CM | POA: Diagnosis not present

## 2024-04-24 DIAGNOSIS — K439 Ventral hernia without obstruction or gangrene: Secondary | ICD-10-CM | POA: Diagnosis not present

## 2024-04-24 DIAGNOSIS — E039 Hypothyroidism, unspecified: Secondary | ICD-10-CM

## 2024-04-24 DIAGNOSIS — R519 Headache, unspecified: Secondary | ICD-10-CM | POA: Diagnosis not present

## 2024-04-24 DIAGNOSIS — K429 Umbilical hernia without obstruction or gangrene: Secondary | ICD-10-CM

## 2024-04-24 DIAGNOSIS — E663 Overweight: Secondary | ICD-10-CM | POA: Insufficient documentation

## 2024-04-24 DIAGNOSIS — I6523 Occlusion and stenosis of bilateral carotid arteries: Secondary | ICD-10-CM | POA: Diagnosis not present

## 2024-04-24 DIAGNOSIS — D696 Thrombocytopenia, unspecified: Secondary | ICD-10-CM | POA: Insufficient documentation

## 2024-04-24 DIAGNOSIS — G441 Vascular headache, not elsewhere classified: Secondary | ICD-10-CM

## 2024-04-24 LAB — CBC
HCT: 38.1 % — ABNORMAL LOW (ref 39.0–52.0)
Hemoglobin: 13.3 g/dL (ref 13.0–17.0)
MCH: 33.1 pg (ref 26.0–34.0)
MCHC: 34.9 g/dL (ref 30.0–36.0)
MCV: 94.8 fL (ref 80.0–100.0)
Platelets: 58 K/uL — ABNORMAL LOW (ref 150–400)
RBC: 4.02 MIL/uL — ABNORMAL LOW (ref 4.22–5.81)
RDW: 13.9 % (ref 11.5–15.5)
WBC: 4 K/uL (ref 4.0–10.5)
nRBC: 0 % (ref 0.0–0.2)

## 2024-04-24 LAB — COMPREHENSIVE METABOLIC PANEL WITH GFR
ALT: 63 U/L — ABNORMAL HIGH (ref 0–44)
AST: 118 U/L — ABNORMAL HIGH (ref 15–41)
Albumin: 3.1 g/dL — ABNORMAL LOW (ref 3.5–5.0)
Alkaline Phosphatase: 107 U/L (ref 38–126)
Anion gap: 14 (ref 5–15)
BUN: 13 mg/dL (ref 6–20)
CO2: 25 mmol/L (ref 22–32)
Calcium: 8.9 mg/dL (ref 8.9–10.3)
Chloride: 97 mmol/L — ABNORMAL LOW (ref 98–111)
Creatinine, Ser: 0.81 mg/dL (ref 0.61–1.24)
GFR, Estimated: >60 mL/min (ref >60)
Glucose, Bld: 137 mg/dL — ABNORMAL HIGH (ref 70–99)
Potassium: 3.8 mmol/L (ref 3.5–5.1)
Sodium: 136 mmol/L (ref 135–145)
Total Bilirubin: 2.3 mg/dL — ABNORMAL HIGH (ref 0.0–1.2)
Total Protein: 7.3 g/dL (ref 6.5–8.1)

## 2024-04-24 LAB — VITAMIN B12: Vitamin B-12: 795 pg/mL (ref 180–914)

## 2024-04-24 LAB — IRON: Iron: 135 ug/dL (ref 45–182)

## 2024-04-24 MED ORDER — SODIUM CHLORIDE 0.9 % IV SOLN
12.5000 mg | Freq: Once | INTRAVENOUS | Status: AC
  Administered 2024-04-24: 12.5 mg via INTRAVENOUS
  Filled 2024-04-24: qty 12.5

## 2024-04-24 MED ORDER — PANTOPRAZOLE SODIUM 40 MG PO TBEC
40.0000 mg | DELAYED_RELEASE_TABLET | Freq: Two times a day (BID) | ORAL | Status: DC
  Administered 2024-04-24 – 2024-04-25 (×3): 40 mg via ORAL
  Filled 2024-04-24 (×3): qty 1

## 2024-04-24 MED ORDER — MAGNESIUM SULFATE 2 GM/50ML IV SOLN
2.0000 g | Freq: Once | INTRAVENOUS | Status: AC
  Administered 2024-04-24: 2 g via INTRAVENOUS
  Filled 2024-04-24: qty 50

## 2024-04-24 MED ORDER — DIPHENHYDRAMINE HCL 50 MG/ML IJ SOLN
12.5000 mg | Freq: Once | INTRAMUSCULAR | Status: AC
  Administered 2024-04-24: 12.5 mg via INTRAVENOUS
  Filled 2024-04-24: qty 1

## 2024-04-24 MED ORDER — AMLODIPINE BESYLATE 5 MG PO TABS
5.0000 mg | ORAL_TABLET | Freq: Every day | ORAL | Status: DC
  Administered 2024-04-24: 5 mg via ORAL
  Filled 2024-04-24: qty 1

## 2024-04-24 NOTE — Assessment & Plan Note (Signed)
 Secondary to alcohol abuse

## 2024-04-24 NOTE — Assessment & Plan Note (Signed)
 Seems improved at this point.  No tremor.

## 2024-04-24 NOTE — Assessment & Plan Note (Signed)
-  On levothyroxine

## 2024-04-24 NOTE — Assessment & Plan Note (Signed)
 On Coreg  and Plavix 

## 2024-04-24 NOTE — Assessment & Plan Note (Signed)
 Seems more likely related to the hernia.  Will have surgical team do a consultation.

## 2024-04-24 NOTE — Assessment & Plan Note (Signed)
 Patient having tenderness over this hernia.  Will have surgical team evaluate.

## 2024-04-24 NOTE — Assessment & Plan Note (Signed)
 Will get a CT scan of the head, IV magnesium , IV Benadryl and IV Phenergan.

## 2024-04-24 NOTE — Plan of Care (Signed)

## 2024-04-24 NOTE — Progress Notes (Signed)
 Progress Note   Patient: Edward Powell FMW:[id number] DOB: 06/10/1971 DOA: 04/22/2024     2 DOS: the patient was seen and examined on 04/24/2024   Brief hospital course: Hospital course / significant events:   Abdias Seiber is a 53 y.o. male with a known history of EtOH, esophageal varices, CAD status post CABG, HTN, HLD, ischemic cardiomyopathy, pulm HTN, hypothyroidism, TIA presents to the emergency department for evaluation of chest pain.   HPI: onset of sharp chest pain on the left side of his sternum with tenderness.  He reports a fullness and swelling along with exquisite tenderness to palpation.  His wife at bedside who provides the majority of the history states that he has not had any injuries, exercise or other inciting events that would have caused pain. With respect to his alcohol use his wife states that he binge drinks for several days in a row and then will go several weeks without drinking any at all.  He has been drinking significant amounts of alcohol in the last week or so.  Last drink was yesterday.  He has detoxed on his own and in the hospital.  11/03: to ED. Pt became diaphoretic and hypertensive with symptoms of confusion, consistent with acute alcohol withdrawal.  He he received Ativan  multivitamin folic acid  and thiamine  as well as Dilaudid  and Zofran . Medical admission for further management of reproducible chest wall pain as well as acute alcohol withdrawal syndrome.  11/04: more alert today, persistent reproducible chest wall pain/tenderness getting ultrasound to evaluate for point tenderness question cyst on palpation, transfer out stepdown. 11/5.  Patient having headache since yesterday.  Normally does not get headache.  Still having this pain in his epigastric/lower chest area.  Has hernia there.              Assessment and Plan: * Alcohol withdrawal delirium (HCC) Seems improved at this point.  No tremor.  Epigastric hernia Patient having tenderness  over this hernia.  Will have surgical team evaluate.  Headache Will get a CT scan of the head, IV magnesium , IV Benadryl and IV Phenergan.  Chest pain Seems more likely related to the hernia.  Will have surgical team do a consultation.  Elevated liver function tests Secondary to alcohol abuse  Overweight (BMI 25.0-29.9) BMI 29.0  History of CAD (coronary artery disease) On Coreg  and Plavix   Thrombocytopenia Secondary to alcohol abuse  Shortness of breath With hypoxia.  Will get a chest x-ray.  Patient placed on oxygen for pulse ox in the high 80s.  Hypothyroidism, unspecified On levothyroxine         Subjective: Patient states he initially came in with chest pain.  With his history of heart disease he was concerned about his heart.  Patient having reproducible pain over the hernia.  Physical Exam: Vitals:   04/24/24 1010 04/24/24 1015 04/24/24 1020 04/24/24 1030  BP:    (!) 164/100  Pulse: 64 66 73 63  Resp: 11 17 15 13   Temp:      TempSrc:      SpO2:      Weight:      Height:       Physical Exam HENT:     Head: Normocephalic.     Mouth/Throat:     Pharynx: No oropharyngeal exudate.  Eyes:     General: Lids are normal.     Conjunctiva/sclera: Conjunctivae normal.  Cardiovascular:     Rate and Rhythm: Normal rate and regular rhythm.  Heart sounds: Normal heart sounds, S1 normal and S2 normal.  Pulmonary:     Breath sounds: No decreased breath sounds, wheezing, rhonchi or rales.  Abdominal:     Palpations: Abdomen is soft.     Tenderness: There is abdominal tenderness in the epigastric area.     Comments: Epigastric hernia felt.  Musculoskeletal:     Right lower leg: No swelling.     Left lower leg: No swelling.  Skin:    General: Skin is warm.     Findings: No rash.  Neurological:     Mental Status: He is alert and oriented to person, place, and time.     Comments: No tremor.     Data Reviewed: Creatinine 0.81, AST 118, ALT 63, total  bilirubin 2.3, platelet count 58, white blood cell count 4.0, hemoglobin 13.3  Family Communication: Wife and father at bedside  Disposition: Status is: Inpatient Remains inpatient appropriate because: Will get a chest x-ray and CT scan of the head without contrast treat headache with IV Benadryl, Phenergan and magnesium .  Surgical consultation for hernia.  Planned Discharge Destination: Home    Time spent: 28 minutes  Author: Charlie Patterson, MD 04/24/2024 11:14 AM  For on call review [email or web address].

## 2024-04-24 NOTE — Evaluation (Signed)
 Physical Therapy Evaluation Patient Details Name: Edward Powell MRN: [id number] DOB: 14-Feb-1971 Today's Date: 04/24/2024  History of Present Illness  53 y.o. male with a known history of EtOH, esophageal varices, CAD status post CABG, HTN, HLD, ischemic cardiomyopathy, pulm HTN, hypothyroidism, TIA presents to the emergency department for evaluation of chest pain.  Clinical Impression  Pt was initially a little sleepy, but did wake and was eager to work with PT.  He showed some general weakness that was unexpected for his age (UE, specifically L, more than LEs) but was functional t/o. He had some occasional unsteadiness w/o LOBs during many 100s of feet of ambulation w/o AD or supplemental O2 (268ft, rest break, stairs, 178ft.  HR did breach 120 after stairs effort, SpO2 remained in the 90s t/o.  Pt is far from his baseline, will benefit from continued PT to address functional limitations.        If plan is discharge home, recommend the following: Assistance with cooking/housework;Assist for transportation;Help with stairs or ramp for entrance;A little help with walking and/or transfers   Can travel by private vehicle        Equipment Recommendations None recommended by PT  Recommendations for Other Services       Functional Status Assessment Patient has had a recent decline in their functional status and demonstrates the ability to make significant improvements in function in a reasonable and predictable amount of time.     Precautions / Restrictions Precautions Precautions: Fall Restrictions Weight Bearing Restrictions Per Provider Order: No      Mobility  Bed Mobility Overal bed mobility: Modified Independent                  Transfers Overall transfer level: Modified independent Equipment used: None               General transfer comment: rose sit to stand multiple times t/o the session w/o need for direct assist, some initial lightheadedness w/o overt  BP drop/orthostatic symptoms    Ambulation/Gait Ambulation/Gait assistance: Contact guard assist Gait Distance (Feet): 250 Feet (after seated rest break an additional 100 ft) Assistive device: None         General Gait Details: Pt initially with guarded and slow gait, did improve these with increased time/cuing though remains not at baseline.  He did have some issues with occasional stagger steps that he managed to self arrest w/o issue.  Pt's O2 remained in the 90s on RA the entire time, though he did have increased HR with increased effort - up to 120s post stair effort.  Generally staying in the 90-110 range during ambulation.  Stairs Stairs: Yes Stairs assistance: Min assist, Contact guard assist Stair Management: One rail Left Number of Stairs: 8 General stair comments: Pt had mis-step on initial stair and needed light assist to insure safety, able to negotiate up the remainder w/o direct assist but close supervision.  Pt did fatigue with the increased effort of up/down steps.  Wheelchair Mobility     Tilt Bed    Modified Rankin (Stroke Patients Only)       Balance Overall balance assessment: Needs assistance Sitting-balance support: Single extremity supported Sitting balance-Leahy Scale: Normal     Standing balance support: No upper extremity supported Standing balance-Leahy Scale: Fair Standing balance comment: Pt ambulated >300 ft w/o LOBs but did have some stagger steps and general unsteadiness as he fatigued  Pertinent Vitals/Pain Pain Assessment Pain Assessment: 0-10 Pain Score: 3  Pain Location: headache is gone, some baseline L chest pain    Home Living Family/patient expects to be discharged to:: Private residence Living Arrangements: Spouse/significant other Available Help at Discharge: Family Type of Home: Apartment Home Access: Stairs to enter Entrance Stairs-Rails: Doctor, General Practice of  Steps: 2 flights     Home Equipment: None      Prior Function Prior Level of Function : Independent/Modified Independent             Mobility Comments: works from home, some driving, OOH 1-2 x/week, does not use AD ADLs Comments: independent with home ADLs     Extremity/Trunk Assessment   Upper Extremity Assessment Upper Extremity Assessment: Generalized weakness (L UE weaker than R, c/o pain in chest with resisted acts)    Lower Extremity Assessment Lower Extremity Assessment: Generalized weakness;Overall WFL for tasks assessed       Communication   Communication Communication: No apparent difficulties    Cognition Arousal: Alert Behavior During Therapy: WFL for tasks assessed/performed   PT - Cognitive impairments: No apparent impairments                         Following commands: Intact       Cueing       General Comments General comments (skin integrity, edema, etc.): Pt motivated to work with PT, get up and move.  SpO2 remained in the 90s on room air t/o the ambulation effort    Exercises     Assessment/Plan    PT Assessment Patient needs continued PT services  PT Problem List Decreased strength;Decreased range of motion;Decreased activity tolerance;Decreased balance;Decreased mobility;Decreased safety awareness;Cardiopulmonary status limiting activity;Pain       PT Treatment Interventions DME instruction;Gait training;Stair training;Functional mobility training;Therapeutic activities;Therapeutic exercise;Balance training;Patient/family education    PT Goals (Current goals can be found in the Care Plan section)  Acute Rehab PT Goals Patient Stated Goal: Get moving better again PT Goal Formulation: With patient Time For Goal Achievement: 05/07/24 Potential to Achieve Goals: Good    Frequency Min 2X/week     Co-evaluation               AM-PAC PT 6 Clicks Mobility  Outcome Measure Help needed turning from your back to  your side while in a flat bed without using bedrails?: None Help needed moving from lying on your back to sitting on the side of a flat bed without using bedrails?: None Help needed moving to and from a bed to a chair (including a wheelchair)?: None Help needed standing up from a chair using your arms (e.g., wheelchair or bedside chair)?: None Help needed to walk in hospital room?: A Little Help needed climbing 3-5 steps with a railing? : A Little 6 Click Score: 22    End of Session Equipment Utilized During Treatment: Gait belt Activity Tolerance: Patient tolerated treatment well;Patient limited by fatigue Patient left: in chair;with call bell/phone within reach;with family/visitor present Nurse Communication: Mobility status (O2 during ambulation) PT Visit Diagnosis: Muscle weakness (generalized) (M62.81);Difficulty in walking, not elsewhere classified (R26.2);Unsteadiness on feet (R26.81)    Time: 8564-8482 PT Time Calculation (min) (ACUTE ONLY): 42 min   Charges:   PT Evaluation $PT Eval Low Complexity: 1 Low PT Treatments $Gait Training: 8-22 mins PT General Charges $$ ACUTE PT VISIT: 1 Visit         Carmin JONELLE Deed, DPT 04/24/2024, 5:08  PM

## 2024-04-24 NOTE — Assessment & Plan Note (Signed)
 With hypoxia.  Will get a chest x-ray.  Patient placed on oxygen for pulse ox in the high 80s.

## 2024-04-24 NOTE — Assessment & Plan Note (Signed)
 BMI 29.0

## 2024-04-24 NOTE — TOC Initial Note (Signed)
 Transition of Care [facility]) - Initial/Assessment Note    Patient Details  Name: Edward Powell MRN: [id number] Date of Birth: June 28, 1970  [facility] CM/SW Contact:    Corrie JINNY Ruts, LCSW Phone Number: 04/24/2024, 2:19 PM  Clinical Narrative:                 Chart reviewed. The patient was admitted for Alcohol withdrawals with delirium. I was able to speak with the patient at bedside today. I introduced myself, my role, and reason for consult. The patient reports that he has been doing well today. The patient reports that that he has a PCP that he sees at least once. SW offered PCP resources. The patient declined resources for PCP. The patient reports that he lives in the home with his wife.   The patient reports that he needed some assistance in the home. The patient reports that his wife drives him to medical appointments and his wife will assist him during D/C. The patient reports that he uses [facility] and copays are somewhat affordable. The patient reports that he has never had HH or been admitted into a SNF in the past. The patient reports that he has no equipment in the home. The patient reports that he has no questions or concerns during the time of the assessment.   SW offered the patient Substance use resources and the patient declined offer.   There are no other TOC needs at this time.     Barriers to Discharge: Continued Medical Work up   Patient Goals and CMS Choice            Expected Discharge Plan and Services       Living arrangements for the past 2 months: Single Family Home                                      Prior Living Arrangements/Services Living arrangements for the past 2 months: Single Family Home Lives with:: Spouse Patient language and need for interpreter reviewed:: Yes        Need for Family Participation in Patient Care: Yes (Comment)     Criminal Activity/Legal Involvement Pertinent to Current  Situation/Hospitalization: No - Comment as needed  Activities of Daily Living   ADL Screening (condition at time of admission) Independently performs ADLs?: Yes (appropriate for developmental age) Is the patient deaf or have difficulty hearing?: Yes Does the patient have difficulty seeing, even when wearing glasses/contacts?: No Does the patient have difficulty concentrating, remembering, or making decisions?: No  Permission Sought/Granted                  Emotional Assessment Appearance:: Appears stated age Attitude/Demeanor/Rapport: Engaged, Gracious Affect (typically observed): Calm, Pleasant Orientation: : Oriented to Self, Oriented to Place, Oriented to  Time, Oriented to Situation Alcohol / Substance Use: Alcohol Use    Admission diagnosis:  Alcohol withdrawal delirium (HCC) [F10.931] Chest pain, unspecified type [R07.9] Patient Active Problem List   Diagnosis Date Noted   Headache 04/24/2024   Thrombocytopenia 04/24/2024   History of CAD (coronary artery disease) 04/24/2024   Overweight (BMI 25.0-29.9) 04/24/2024   Epigastric hernia 04/24/2024   Alcohol withdrawal delirium (HCC) 04/22/2024   Alcohol abuse 08/22/2023   Alcohol withdrawal (HCC) 08/21/2023   Hematemesis 08/21/2023   HFrEF (heart failure with reduced ejection fraction) (HCC) 08/21/2023   TIA (transient ischemic attack) 08/21/2023  Epistaxis 05/24/2023   Alcoholic hepatitis (HCC) 05/24/2023   Elevated hemoglobin A1c 05/24/2023   Shortness of breath 05/24/2023   Chest pain 05/22/2023   Alcohol use disorder 05/22/2023   Hypothyroidism, unspecified 05/22/2023   Elevated liver function tests 05/22/2023   Cellulitis of left hand 05/03/2020   Status post primary angioplasty with coronary stent 06/15/2018   Effort angina 06/14/2018   Ischemic cardiomyopathy 06/14/2018   Hyperlipidemia 06/14/2018   Obesity (BMI 30.0-34.9) 05/10/2018   Moderate concentric left ventricular hypertrophy 05/10/2018    Essential hypertension 03/08/2018   CAD S/P percutaneous coronary angioplasty    Hx of CABG 01/10/2018   Unstable angina (HCC) 01/09/2018   NSTEMI (non-ST elevated myocardial infarction) [facility])    Asthmatic bronchitis 01/08/2018   PCP:  Bernardo Fend, DO Pharmacy:   [facility] [address], [place] - [address] [place] [address] Phone: [phone] Fax: [phone]     Social Drivers of Health (SDOH) Social History: SDOH Screenings   Food Insecurity: No Food Insecurity (04/23/2024)  Housing: Low Risk  (04/23/2024)  Transportation Needs: No Transportation Needs (04/23/2024)  Utilities: Not At Risk (04/23/2024)  Alcohol Screen: Low Risk  (04/25/2023)  Depression (PHQ2-9): Low Risk  (04/25/2023)  Financial Resource Strain: Low Risk  (04/25/2023)  Physical Activity: Unknown (04/25/2023)  Social Connections: Moderately Isolated (04/23/2024)  Stress: No Stress Concern Present (04/25/2023)  Tobacco Use: Medium Risk (04/23/2024)   SDOH Interventions: Housing Interventions: Patient Declined   Readmission Risk Interventions     No data to display

## 2024-04-24 NOTE — Progress Notes (Signed)
 SATURATION QUALIFICATIONS: (This note is used to comply with regulatory documentation for home oxygen)  Patient Saturations on Room Air at Rest = 96 %  Patient Saturations on Room Air while Ambulating = 94%  Patient Saturations on 0 Liters of oxygen while Ambulating = 92%  Please briefly explain why patient needs home oxygen:

## 2024-04-24 NOTE — Consult Note (Signed)
 Patient ID: Edward Powell, male   DOB: 1971/06/20, 53 y.o.   MRN: [id number] CC: Umbilical Hernia/ Substernal Hernia History of Present Illness Edward Powell is a 53 y.o. male with past medical history significant for portal hypertension with known esophageal varices, coronary artery disease, pulmonary hypertension who presents consultation for chest pain.  The patient reports that he has had chest pain that has worsened over the last several days.  He also associates this with shortness of breath.  As far as his epigastric hernia.  He reports that he has had this since he had his CABG.  He says he feels like his sternum did not realigned properly after his surgery.  He also reports he has an umbilical hernia for some time.  He denies having any obstructive symptoms.  He says that he can push his umbilical hernia and when he lays down.  He has significant alcohol use disorder and had a EGD in March of this year that showed esophageal varices.  He does have a hyperbilirubinemia with.  No recent INR to calculate MELD or Child-Pugh.  Past Medical History Past Medical History:  Diagnosis Date   Alcohol abuse    CAD (coronary artery disease)    a. 12/2017 NSTEMI/Cath: Severe 3VD; b. 12/2017 CABG x 4: LIMA->LAD, VG->RI->LCX, VG->RPDA; c. 05/2018 PCI: LM 50d, LAD 50ost, 80p/m, RI nl, LCX 100p, OM2 fills via collats from dLAD, RCA 70p (2.75 x 18 Resolute Onyx DES), 3m, 88m, 60d, VG->OM2 100, LIMA->LAD ok, VG->RPDA 100ost.   Essential hypertension    Hernia, epigastric    Hernia, umbilical    Hyperlipidemia LDL goal <70    Hypertriglyceridemia    Ischemic cardiomyopathy    a. 05/2018 LV Gram: EF 35-45%; b. 08/2018 Echo: EF 55-60%, nl RV fxn, RVSP 41.12mmHg. Grossly nl MV/TV/AV.   Pulmonary hypertension (HCC)    a. 12/2017 Echo: EF 55-60%; b. 08/2018 Echo: EF 55-60%, RVSP 41.6 mmHg.   Tobacco abuse        Past Surgical History:  Procedure Laterality Date   CORONARY ARTERY BYPASS GRAFT N/A  01/10/2018   Procedure: CORONARY ARTERY BYPASS GRAFTING (CABG);  Surgeon: Army Dallas NOVAK, MD;  Location: [facility] OR;  Service: Open Heart Surgery;  Laterality: N/A;  Times 4 using left internal mammary artery to the LAD and endoscopically harvested left saphenous vein to OM1, OM2, and PLB   CORONARY STENT INTERVENTION N/A 06/14/2018   Procedure: CORONARY STENT INTERVENTION;  Surgeon: Darron Deatrice LABOR, MD;  Location: [facility] INVASIVE CV LAB;  Service: Cardiovascular;  Laterality: N/A;   ESOPHAGOGASTRODUODENOSCOPY N/A 08/22/2023   Procedure: ESOPHAGOGASTRODUODENOSCOPY (EGD);  Surgeon: Jinny Carmine, MD;  Location: [facility] ENDOSCOPY;  Service: Endoscopy;  Laterality: N/A;   LEFT HEART CATH AND CORONARY ANGIOGRAPHY N/A 01/09/2018   Procedure: LEFT HEART CATH AND CORONARY ANGIOGRAPHY;  Surgeon: Mady Bruckner, MD;  Location: [facility] INVASIVE CV LAB;  Service: Cardiovascular;  Laterality: N/A;   LEFT HEART CATH AND CORONARY ANGIOGRAPHY N/A 05/31/2018   Procedure: LEFT HEART CATH AND CORONARY ANGIOGRAPHY;  Surgeon: Darron Deatrice LABOR, MD;  Location: [facility] INVASIVE CV LAB;  Service: Cardiovascular;  Laterality: N/A;   NO PAST SURGERIES     TEE WITHOUT CARDIOVERSION N/A 01/10/2018   Procedure: TRANSESOPHAGEAL ECHOCARDIOGRAM (TEE);  Surgeon: Army Dallas NOVAK, MD;  Location: [facility] OR;  Service: Open Heart Surgery;  Laterality: N/A;    Allergies  Allergen Reactions   Cat Dander Other (See Comments)    Sneezing   Dilaudid  [Hydromorphone ] Other (See Comments)  Pt started feeling extremely hot/Diaphoresis    Current Facility-Administered Medications  Medication Dose Route Frequency Provider Last Rate Last Admin   amLODipine  (NORVASC ) tablet 5 mg  5 mg Oral Daily Josette Ade, MD   5 mg at 04/24/24 1217   bisacodyl  (DULCOLAX) EC tablet 5 mg  5 mg Oral Daily PRN Hugelmeyer, Alexis, DO       carvedilol  (COREG ) tablet 25 mg  25 mg Oral BID WC Hugelmeyer, Alexis, DO   25 mg at 04/24/24 1822   Chlorhexidine  Gluconate  Cloth 2 % PADS 6 each  6 each Topical Daily Alexander, Natalie, DO   6 each at 04/24/24 9065   clopidogrel  (PLAVIX ) tablet 75 mg  75 mg Oral Daily Hugelmeyer, Alexis, DO   75 mg at 04/24/24 9066   enoxaparin  (LOVENOX ) injection 40 mg  40 mg Subcutaneous Q24H Alexander, Natalie, DO   40 mg at 04/24/24 1518   folic acid  (FOLVITE ) tablet 1 mg  1 mg Oral Daily Quale, Mark, MD   1 mg at 04/24/24 9066   hydrALAZINE  (APRESOLINE ) injection 5 mg  5 mg Intravenous Q6H PRN Hugelmeyer, Alexis, DO   5 mg at 04/23/24 0308   HYDROcodone -acetaminophen  (NORCO/VICODIN) 5-325 MG per tablet 1-2 tablet  1-2 tablet Oral Q4H PRN Hugelmeyer, Alexis, DO   2 tablet at 04/24/24 0830   levothyroxine  (SYNTHROID ) tablet 25 mcg  25 mcg Oral Q0600 Hugelmeyer, Alexis, DO   25 mcg at 04/24/24 0604   LORazepam  (ATIVAN ) tablet 1-4 mg  1-4 mg Oral Q1H PRN Hugelmeyer, Alexis, DO       Or   LORazepam  (ATIVAN ) injection 1-4 mg  1-4 mg Intravenous Q1H PRN Hugelmeyer, Alexis, DO   2 mg at 04/23/24 1507   LORazepam  (ATIVAN ) tablet 0-4 mg  0-4 mg Oral Q8H Hugelmeyer, Alexis, DO       morphine  (PF) 2 MG/ML injection 1 mg  1 mg Intravenous Q6H PRN Hugelmeyer, Alexis, DO       multivitamin with minerals tablet 1 tablet  1 tablet Oral Daily Dicky Anes, MD   1 tablet at 04/24/24 0933   ondansetron  (ZOFRAN ) tablet 4 mg  4 mg Oral Q6H PRN Hugelmeyer, Alexis, DO       Or   ondansetron  (ZOFRAN ) injection 4 mg  4 mg Intravenous Q6H PRN Hugelmeyer, Alexis, DO   4 mg at 04/23/24 1119   pantoprazole  (PROTONIX ) EC tablet 40 mg  40 mg Oral BID Josette Ade, MD   40 mg at 04/24/24 2035   senna-docusate (Senokot-S) tablet 1 tablet  1 tablet Oral QHS PRN Hugelmeyer, Alexis, DO       sodium chloride  flush (NS) 0.9 % injection 3 mL  3 mL Intravenous Q12H Hugelmeyer, Alexis, DO   3 mL at 04/24/24 2131   thiamine  (VITAMIN B1) tablet 100 mg  100 mg Oral Daily Hugelmeyer, Alexis, DO   100 mg at 04/24/24 9066   Or   thiamine  (VITAMIN B1) injection 100 mg   100 mg Intravenous Daily Hugelmeyer, Alexis, DO        Family History Family History  Problem Relation Age of Onset   Hypertension Mother    Breast cancer Mother    Hyperlipidemia Mother    Liver cancer Father    Lung cancer Father    Hypertension Father    Atrial fibrillation Father    Stroke Brother    Hypertension Brother    Hyperlipidemia Brother    Heart attack Maternal Grandmother    Stroke  Maternal Grandmother    Hyperlipidemia Maternal Grandmother    Hypertension Maternal Grandmother    Stroke Paternal Grandmother    Alzheimer's disease Paternal Grandmother    Hypertension Paternal Grandmother        Social History Social History   Tobacco Use   Smoking status: Former    Current packs/day: 1.00    Average packs/day: 1 pack/day for 30.0 years (30.0 ttl pk-yrs)    Types: Cigarettes   Smokeless tobacco: Never   Tobacco comments:    last cigarette was day of the heart attack  Vaping Use   Vaping status: Never Used  Substance Use Topics   Alcohol use: Yes    Alcohol/week: 18.0 standard drinks of alcohol    Types: 18 Shots of liquor per week    Comment: up to 8-9 shots twice weekly - vodka   Drug use: Not Currently        ROS Full ROS of systems performed and is otherwise negative there than what is stated in the HPI  Physical Exam Blood pressure 117/71, pulse 64, temperature 99 F (37.2 C), temperature source Oral, resp. rate 16, height 5' 8 (1.727 m), weight 86.5 kg, SpO2 96%.  Sternal scar well-healed.  Reducible substernal hernia, reducible umbilical hernia with some purple discoloration of his umbilicus  Data Reviewed CT scan reviewed.  He does have epigastric and umbilical hernia that are both fat-containing.  I do not appreciate any ascites.  Low end of normal sodium.  Normal creatinine.  Hyperbilirubinemia and hypoalbuminemia  I have personally reviewed the patient's imaging and medical records.    Assessment/Plan   Patient with umbilical and  epigastric hernia.  These are both reducible.  Some of the chest pain could be attributable to his epigastric hernia but I do not think this is causing him any shortness of breath.  I also discussed with him that he has signs of liver dysfunction secondary to alcohol use.  At this time he is not a candidate for elective repair of the hernias.  He has no obstructive symptoms and there is no indication for emergent surgical repair.  Surgery team to sign off for now please call with any questions or concerns   Total of 60 minutes was spent reviewing the patient's chart, performing history and physical and discussing treatment options with the patient   Jayson MALVA Endow 04/24/2024, 10:44 PM

## 2024-04-25 DIAGNOSIS — G4489 Other headache syndrome: Secondary | ICD-10-CM

## 2024-04-25 DIAGNOSIS — I10 Essential (primary) hypertension: Secondary | ICD-10-CM

## 2024-04-25 DIAGNOSIS — F10931 Alcohol use, unspecified with withdrawal delirium: Secondary | ICD-10-CM | POA: Diagnosis not present

## 2024-04-25 DIAGNOSIS — R0789 Other chest pain: Secondary | ICD-10-CM | POA: Diagnosis not present

## 2024-04-25 DIAGNOSIS — R0602 Shortness of breath: Secondary | ICD-10-CM

## 2024-04-25 LAB — CBC
HCT: 39 % (ref 39.0–52.0)
Hemoglobin: 13.5 g/dL (ref 13.0–17.0)
MCH: 33.1 pg (ref 26.0–34.0)
MCHC: 34.6 g/dL (ref 30.0–36.0)
MCV: 95.6 fL (ref 80.0–100.0)
Platelets: 60 K/uL — ABNORMAL LOW (ref 150–400)
RBC: 4.08 MIL/uL — ABNORMAL LOW (ref 4.22–5.81)
RDW: 13.6 % (ref 11.5–15.5)
WBC: 3.9 K/uL — ABNORMAL LOW (ref 4.0–10.5)
nRBC: 0 % (ref 0.0–0.2)

## 2024-04-25 LAB — TROPONIN I (HIGH SENSITIVITY): Troponin I (High Sensitivity): 11 ng/L (ref <18)

## 2024-04-25 MED ORDER — AMLODIPINE BESYLATE 10 MG PO TABS
10.0000 mg | ORAL_TABLET | Freq: Every day | ORAL | Status: DC
  Administered 2024-04-25: 10 mg via ORAL
  Filled 2024-04-25: qty 1

## 2024-04-25 MED ORDER — PANTOPRAZOLE SODIUM 40 MG PO TBEC: 40.0000 mg | DELAYED_RELEASE_TABLET | Freq: Every day | ORAL | 0 refills | Status: AC

## 2024-04-25 MED ORDER — FOLIC ACID 1 MG PO TABS: 1.0000 mg | ORAL_TABLET | Freq: Every day | ORAL | 0 refills | Status: AC

## 2024-04-25 MED ORDER — AMLODIPINE BESYLATE 10 MG PO TABS: 10.0000 mg | ORAL_TABLET | Freq: Every day | ORAL | 0 refills | Status: AC

## 2024-04-25 MED ORDER — OXYCODONE HCL 5 MG PO TABS: 5.0000 mg | ORAL_TABLET | Freq: Three times a day (TID) | ORAL | 0 refills | Status: AC | PRN

## 2024-04-25 MED ORDER — VITAMIN B-1 100 MG PO TABS: 100.0000 mg | ORAL_TABLET | Freq: Every day | ORAL | 0 refills | Status: AC

## 2024-04-25 NOTE — Progress Notes (Signed)
 The patient c/o  a headache 4/10, BP 145/96. This clinical research associate offered the patient PRN Hydralazine  5mg  and PRN Tylenol  650mg  but the patient refused and told this clinical research associate that he wants to wait until after he eats breakfast.

## 2024-04-25 NOTE — Assessment & Plan Note (Signed)
 Continue Coreg  and added Norvasc .

## 2024-04-25 NOTE — Plan of Care (Signed)

## 2024-04-25 NOTE — Assessment & Plan Note (Signed)
 Replaced

## 2024-04-25 NOTE — Discharge Summary (Signed)
 Physician Discharge Summary   Patient: Edward Powell MRN: [id number] DOB: [birth year]  Admit date:     04/22/2024  Discharge date: 04/25/2024  Discharge Physician: Charlie Patterson   PCP: Bernardo Fend, DO   Recommendations at discharge:   Follow-up PCP 5 days  Discharge Diagnoses: Principal Problem:   Alcohol withdrawal delirium (HCC) Active Problems:   Headache   Epigastric hernia   Essential hypertension   Chest pain   Elevated liver function tests   Hypothyroidism, unspecified   Shortness of breath   Thrombocytopenia   History of CAD (coronary artery disease)   Overweight (BMI 25.0-29.9)   Hypomagnesemia    Hospital Course: Hospital course / significant events:   Edward Powell is a 53 y.o. male with a known history of EtOH, esophageal varices, CAD status post CABG, HTN, HLD, ischemic cardiomyopathy, pulm HTN, hypothyroidism, TIA presents to the emergency department for evaluation of chest pain.   HPI: onset of sharp chest pain on the left side of his sternum with tenderness.  He reports a fullness and swelling along with exquisite tenderness to palpation.  His wife at bedside who provides the majority of the history states that he has not had any injuries, exercise or other inciting events that would have caused pain. With respect to his alcohol use his wife states that he binge drinks for several days in a row and then will go several weeks without drinking any at all.  He has been drinking significant amounts of alcohol in the last week or so.  Last drink was yesterday.  He has detoxed on his own and in the hospital.  11/03: to ED. Pt became diaphoretic and hypertensive with symptoms of confusion, consistent with acute alcohol withdrawal.  He he received Ativan  multivitamin folic acid  and thiamine  as well as Dilaudid  and Zofran . Medical admission for further management of reproducible chest wall pain as well as acute alcohol withdrawal syndrome.  11/04: more alert  today, persistent reproducible chest wall pain/tenderness getting ultrasound to evaluate for point tenderness question cyst on palpation, transfer out stepdown. 11/5.  Patient having headache since yesterday.  Normally does not get headache.  Still having this pain in his epigastric/lower chest area.  Has hernia there. 11/6.  Patient feeling better and wants to go home.              Assessment and Plan: * Alcohol withdrawal delirium (HCC) Resolved.  No tremor.  Advised cessation of alcohol from this point forward.  Epigastric hernia Patient having tenderness over this hernia.  Seen by general surgery and not a candidate for elective hernia repair secondary to both hernias being reducible and patient having low platelets and being on Plavix .  Headache CT scan of the head negative.  Improved with medications yesterday and controlling blood pressure.  Chest pain Seems more likely related to the hernia and/or musculoskeletal.  A few pain pills prescribed upon discharge.  Essential hypertension Continue Coreg  and added Norvasc .  Elevated liver function tests Secondary to alcohol abuse.  Continue to not drink alcohol recommended.  Hypomagnesemia Replaced  Overweight (BMI 25.0-29.9) BMI 29.0  History of CAD (coronary artery disease) On Coreg  and Plavix   Thrombocytopenia Secondary to alcohol abuse.  Platelet count 60,000.  Advised that further away from alcohol the platelet count may improve.  Shortness of breath Improved.  Patient now off oxygen.  Hypothyroidism, unspecified On levothyroxine          Consultants: General Surgery Procedures performed: None Disposition: Home Diet recommendation:  Cardiac diet DISCHARGE MEDICATION: Allergies as of 04/25/2024       Reactions   Cat Dander Other (See Comments)   Sneezing   Dilaudid  [hydromorphone ] Other (See Comments)   Pt started feeling extremely hot/Diaphoresis        Medication List     STOP taking  these medications    oxymetazoline  0.05 % nasal spray Commonly known as: AFRIN   UNABLE TO FIND       TAKE these medications    amLODipine  10 MG tablet Commonly known as: NORVASC  Take 1 tablet (10 mg total) by mouth daily. Start taking on: April 26, 2024   carvedilol  25 MG tablet Commonly known as: COREG  Take 1 tablet (25 mg total) by mouth 2 (two) times daily with a meal. PLEASE MAKE AN APPOINTMENT IN ORDER TO RECEIVE ADDITIONAL REFILLS, FIRST ATTEMPT.   clopidogrel  75 MG tablet Commonly known as: PLAVIX  TAKE 1 TABLET BY MOUTH ONCE DAILY *PLEASE CONTACT OFFICE FOR FUTURE APPOINTMENT AND REFILLS*   folic acid  1 MG tablet Commonly known as: FOLVITE  Take 1 tablet (1 mg total) by mouth daily. Start taking on: April 26, 2024   levothyroxine  25 MCG tablet Commonly known as: SYNTHROID  Take 1 tablet by mouth once daily   loratadine 10 MG tablet Commonly known as: CLARITIN Take 10 mg by mouth daily as needed for allergies.   multivitamin with minerals Tabs tablet Take 1 tablet by mouth daily.   oxyCODONE  5 MG immediate release tablet Commonly known as: Roxicodone  Take 1 tablet (5 mg total) by mouth every 8 (eight) hours as needed.   pantoprazole  40 MG tablet Commonly known as: PROTONIX  Take 1 tablet (40 mg total) by mouth at bedtime.   thiamine  100 MG tablet Commonly known as: Vitamin B-1 Take 1 tablet (100 mg total) by mouth daily. Start taking on: April 26, 2024        Follow-up Information     Bernardo Fend, DO Follow up in 5 day(s).   Specialty: Internal Medicine Contact information: [address] [place] [address] [phone]                Discharge Exam: Fredricka Weights   04/22/24 1644 04/23/24 0035  Weight: 89.8 kg 86.5 kg   Physical Exam HENT:     Head: Normocephalic.     Mouth/Throat:     Pharynx: No oropharyngeal exudate.  Eyes:     General: Lids are normal.     Conjunctiva/sclera: Conjunctivae  normal.  Cardiovascular:     Rate and Rhythm: Normal rate and regular rhythm.     Heart sounds: Normal heart sounds, S1 normal and S2 normal.  Pulmonary:     Breath sounds: No decreased breath sounds, wheezing, rhonchi or rales.  Abdominal:     Palpations: Abdomen is soft.     Tenderness: There is abdominal tenderness in the epigastric area.     Comments: Epigastric and umbilical hernia felt.  Musculoskeletal:     Right lower leg: No swelling.     Left lower leg: No swelling.  Skin:    General: Skin is warm.     Findings: No rash.  Neurological:     Mental Status: He is alert and oriented to person, place, and time.     Comments: No tremor.      Condition at discharge: stable  The results of significant diagnostics from this hospitalization (including imaging, microbiology, ancillary and laboratory) are listed below for reference.   Imaging Studies:  DG Chest Port 1 View Result Date: 04/24/2024 EXAM: 1 VIEW(S) XRAY OF THE CHEST 04/24/2024 10:45:00 AM COMPARISON: 04/22/2024 CLINICAL HISTORY: Shortness of breath FINDINGS: LUNGS AND PLEURA: No focal pulmonary opacity. No pulmonary edema. No pleural effusion. No pneumothorax. HEART AND MEDIASTINUM: CABG markers noted. No acute abnormality of the cardiac and mediastinal silhouettes. BONES AND SOFT TISSUES: Sternotomy wires noted. No acute osseous abnormality. IMPRESSION: 1. No acute cardiopulmonary process. Electronically signed by: Dayne Hassell MD 04/24/2024 03:14 PM EST RP Workstation: [id number]   CT HEAD WO CONTRAST ( ) Result Date: 04/24/2024 EXAM: CT HEAD WITHOUT CONTRAST 04/24/2024 11:14:32 AM TECHNIQUE: CT of the head was performed without the administration of intravenous contrast. Automated exposure control, iterative reconstruction, and/or weight based adjustment of the mA/kV was utilized to reduce the radiation dose to as low as reasonably achievable. COMPARISON: CT head without contrast 02/18/2012. CLINICAL HISTORY: Headache,  increasing frequency or severity. FINDINGS: BRAIN AND VENTRICLES: No acute hemorrhage. No evidence of acute infarct. No hydrocephalus. No extra-axial collection. No mass effect or midline shift. Mild atherosclerotic changes are present in the cavernous internal carotid arteries bilaterally. No hyperdense vessel is present. ORBITS: No acute abnormality. SINUSES: No acute abnormality. SOFT TISSUES AND SKULL: No acute soft tissue abnormality. No skull fracture. IMPRESSION: 1. No acute intracranial abnormality. Electronically signed by: Lonni Necessary MD 04/24/2024 11:24 AM EST RP Workstation: [id number]   US  CHEST SOFT TISSUE Result Date: 04/23/2024 EXAM: US  Left Anterior, Midline Chest Wall Nonvascular Soft Tissue Ultrasound 04/23/2024 04:56:18 PM TECHNIQUE: Targeted sonography examination of the left anterior, midline chest wall with image documentation. COMPARISON: Chest CT 04/22/2024 CLINICAL HISTORY: Tenderness. FINDINGS: SOFT TISSUES: No ultrasound mass or fluid collection in the soft tissues. IMPRESSION: 1. No sonographic correlate for left chest wall tenderness Electronically signed by: Luke Bun MD 04/23/2024 09:03 PM EST RP Workstation: [id number]   US  Abdomen Limited RUQ (LIVER/GB) Result Date: 04/23/2024 CLINICAL DATA:  Elevated liver function tests. EXAM: ULTRASOUND ABDOMEN LIMITED RIGHT UPPER QUADRANT COMPARISON:  CTA abdomen 04/22/2024, prior right upper quadrant ultrasound 08/21/2023 FINDINGS: Gallbladder: Stable small gallstones without wall thickening or sonographic Murphy's sign. Common bile duct: Diameter: 2 mm. Liver: Stable increased echogenicity of the liver parenchyma likely reflecting steatosis. No overt cirrhotic morphology by ultrasound. No visualized focal masses or intrahepatic biliary ductal dilatation. Portal vein is patent on color Doppler imaging with normal direction of blood flow towards the liver. Other: No ascites visualized in the right upper quadrant. IMPRESSION: 1.  Stable cholelithiasis without evidence of acute cholecystitis. 2. Stable increased echogenicity of the liver parenchyma likely reflecting steatosis. Electronically Signed   By: Marcey Moan M.D.   On: 04/23/2024 07:47   CT Angio Chest/Abd/Pel for Dissection W and/or Wo Contrast Result Date: 04/22/2024 EXAM: CTA CHEST, ABDOMEN AND PELVIS WITH AND WITHOUT CONTRAST 04/22/2024 08:17:57 PM TECHNIQUE: CTA of the chest was performed with and without the administration of intravenous contrast. CTA of the abdomen and pelvis was performed with and without the administration of intravenous contrast. Multiplanar reformatted images are provided for review. MIP images are provided for review. Automated exposure control, iterative reconstruction, and/or weight based adjustment of the mA/kV was utilized to reduce the radiation dose to as low as reasonably achievable. 100 mL of iohexol  (OMNIPAQUE ) 350 MG/ML injection was administered. COMPARISON: 08/21/2023 CLINICAL HISTORY: Acute aortic syndrome (AAS) suspected; Patient having rather sharp severe pain just left and anterior of his sternotomy site. Evaluate for possible hernia, defect, or underlying acute vascular abnormality. Pain localizes to the anterior slightly  left chest wall along the edge of the left sternum and some into the left upper quadrant. FINDINGS: VASCULATURE: AORTA: Aortic atherosclerosis. No aortic dissection. No acute finding. No abdominal aortic aneurysm. PULMONARY ARTERIES: No pulmonary embolism with the limits of this exam. GREAT VESSELS OF AORTIC ARCH: No acute finding. No dissection. No arterial occlusion or significant stenosis. 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 SUPERIOR MESENTERIC ARTERY: No acute finding. No occlusion or significant stenosis. INFERIOR MESENTERIC ARTERY: No acute finding. No occlusion or significant stenosis. RENAL ARTERIES: No acute finding. No occlusion or significant stenosis. ILIAC ARTERIES: Iliac  atherosclerosis. No acute finding. No occlusion or significant stenosis. CHEST: MEDIASTINUM: Prior CABG. Mild cardiomegaly. No mediastinal lymphadenopathy. The heart and pericardium demonstrate no acute abnormality. LUNGS AND PLEURA: Left basilar atelectasis. No focal consolidation or pulmonary edema. No evidence of pleural effusion or pneumothorax. THORACIC BONES AND SOFT TISSUES: No acute bone or soft tissue abnormality. ABDOMEN AND PELVIS: LIVER: Hepatic steatosis. GALLBLADDER AND BILE DUCTS: Gallbladder is unremarkable. No biliary ductal dilatation. SPLEEN: The spleen is unremarkable. PANCREAS: The pancreas is unremarkable. ADRENAL GLANDS: Bilateral adrenal glands demonstrate no acute abnormality. KIDNEYS, URETERS AND BLADDER: No stones in the kidneys or ureters. No hydronephrosis. No perinephric or periureteral stranding. Urinary bladder is unremarkable. GI AND BOWEL: Stomach and duodenal sweep demonstrate no acute abnormality. No active diverticulitis. Normal appendix. There is no bowel obstruction. No abnormal bowel wall thickening or distension. REPRODUCTIVE: Reproductive organs are unremarkable. PERITONEUM AND RETROPERITONEUM: No ascites or free air. LYMPH NODES: No lymphadenopathy. ABDOMINAL BONES AND SOFT TISSUES: There is a midline hernia in the epigastric region just below the sternum containing fat. Umbilical hernia also noted containing fat. No acute abnormality of the bones. IMPRESSION: 1. No acute aortic syndrome. 2. 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 3. Midline epigastric fat-containing hernia and umbilical fat-containing hernia. Electronically signed by: Franky Crease MD 04/22/2024 08:42 PM EST RP Workstation: [id number]   DG Chest Portable 1 View Result Date: 04/22/2024 EXAM: 1 VIEW(S) XRAY OF THE CHEST 04/22/2024 05:27:00 PM COMPARISON: None available. CLINICAL HISTORY: chest pain FINDINGS: LUNGS AND PLEURA: No focal pulmonary opacity. No pulmonary edema. No pleural effusion. No  pneumothorax. HEART AND MEDIASTINUM: Unchanged cardiomediastinal silhouette. Atherosclerotic plaque. BONES AND SOFT TISSUES: Sternotomy wires similar appearing with the third from bottom fractures. IMPRESSION: 1. No acute cardiopulmonary process. Electronically signed by: Morgane Naveau MD 04/22/2024 05:55 PM EST RP Workstation: [id number]    Microbiology: Results for orders placed or performed during the hospital encounter of 04/22/24  MRSA Next Gen by PCR, Nasal     Status: None   Collection Time: 04/23/24 11:16 AM   Specimen: Nasal Mucosa; Nasal Swab  Result Value Ref Range Status   MRSA by PCR Next Gen NOT DETECTED NOT DETECTED Final    Comment: (NOTE) The GeneXpert MRSA Assay (FDA approved for NASAL specimens only), is one component of a comprehensive MRSA colonization surveillance program. It is not intended to diagnose MRSA infection nor to guide or monitor treatment for MRSA infections. Test performance is not FDA approved in patients less than 14 years old. Performed at [facility], [address] [place], [place] [address]     Labs: CBC: Recent Labs  Lab 04/22/24 1733 04/23/24 0722 04/24/24 0311 04/25/24 0449  WBC 5.9 5.3 4.0 3.9*  HGB 15.1 14.5 13.3 13.5  HCT 41.8 40.9 38.1* 39.0  MCV 91.5 93.0 94.8 95.6  PLT 101* 76* 58* 60*   Basic  Metabolic Panel: Recent Labs  Lab 04/22/24 1733 04/22/24 2305 04/23/24 0722 04/24/24 0311  NA 137  --  137 136  K 3.1*  --  3.3* 3.8  CL 99  --  99 97*  CO2 21*  --  25 25  GLUCOSE 129*  --  108* 137*  BUN 9  --  12 13  CREATININE 0.64  --  0.68 0.81  CALCIUM  8.6*  --  8.7* 8.9  MG  --  1.2*  --   --   PHOS  --  3.3  --   --    Liver Function Tests: Recent Labs  Lab 04/22/24 1733 04/23/24 0722 04/24/24 0311  AST 153* 125* 118*  ALT 80* 68* 63*  ALKPHOS 120 114 107  BILITOT 2.4* 2.6* 2.3*  PROT 8.4* 8.0 7.3  ALBUMIN  3.5 3.3* 3.1*   CBG: Recent Labs  Lab 04/22/24 1908 04/23/24 0032  GLUCAP 173*  123*    Discharge time spent: greater than 30 minutes.  Signed: Charlie Patterson, MD [facility] 04/25/2024

## 2024-07-03 ENCOUNTER — Other Ambulatory Visit: Payer: Self-pay | Admitting: Physician Assistant

## 2024-07-04 NOTE — Telephone Encounter (Signed)
 In accordance with refill protocols, please review and address the following requirements before this medication refill can be authorized:  Labs

## 2024-07-09 NOTE — Telephone Encounter (Signed)
 Patient was last seen in clinic 09/2022. He will need an follow up appointment prior to any refills.

## 2024-07-10 ENCOUNTER — Other Ambulatory Visit: Payer: Self-pay | Admitting: Physician Assistant
